# Patient Record
Sex: Female | Born: 1940 | Hispanic: No | Smoking: Never smoker
Health system: Southern US, Community
[De-identification: ages and names within clinical notes are randomized; demographics above are authoritative.]

## PROBLEM LIST (undated history)

## (undated) DIAGNOSIS — H5410 Blindness, one eye, low vision other eye, unspecified eyes: Secondary | ICD-10-CM

## (undated) DIAGNOSIS — J45909 Unspecified asthma, uncomplicated: Secondary | ICD-10-CM

## (undated) DIAGNOSIS — G709 Myoneural disorder, unspecified: Secondary | ICD-10-CM

## (undated) DIAGNOSIS — K219 Gastro-esophageal reflux disease without esophagitis: Secondary | ICD-10-CM

## (undated) DIAGNOSIS — M1990 Unspecified osteoarthritis, unspecified site: Secondary | ICD-10-CM

## (undated) DIAGNOSIS — H5440 Blindness, one eye, unspecified eye: Secondary | ICD-10-CM

## (undated) DIAGNOSIS — M069 Rheumatoid arthritis, unspecified: Secondary | ICD-10-CM

## (undated) DIAGNOSIS — I10 Essential (primary) hypertension: Secondary | ICD-10-CM

## (undated) DIAGNOSIS — H5450 Low vision, one eye, unspecified eye: Secondary | ICD-10-CM

## (undated) DIAGNOSIS — K5900 Constipation, unspecified: Secondary | ICD-10-CM

## (undated) DIAGNOSIS — D649 Anemia, unspecified: Secondary | ICD-10-CM

## (undated) HISTORY — PX: RETINAL DETACHMENT SURGERY: SHX105

## (undated) HISTORY — PX: REFRACTIVE SURGERY: SHX103

## (undated) HISTORY — PX: CATARACT EXTRACTION W/ INTRAOCULAR LENS IMPLANT: SHX1309

## (undated) HISTORY — PX: JOINT REPLACEMENT: SHX530

## (undated) HISTORY — PX: EYE SURGERY: SHX253

---

## 2012-10-16 ENCOUNTER — Ambulatory Visit: Payer: Self-pay | Admitting: Physician Assistant

## 2012-11-29 ENCOUNTER — Ambulatory Visit: Payer: Self-pay | Admitting: Family Medicine

## 2013-01-27 ENCOUNTER — Other Ambulatory Visit: Payer: Self-pay | Admitting: Orthopedic Surgery

## 2013-03-17 ENCOUNTER — Encounter (HOSPITAL_COMMUNITY)
Admission: RE | Admit: 2013-03-17 | Discharge: 2013-03-17 | Disposition: A | Discharge status: Home / Self Care | Payer: Medicaid Other | Source: Ambulatory Visit | Attending: Orthopedic Surgery | Admitting: Orthopedic Surgery

## 2013-03-17 ENCOUNTER — Encounter (HOSPITAL_COMMUNITY): Payer: Self-pay | Admitting: Vascular Surgery

## 2013-03-17 ENCOUNTER — Other Ambulatory Visit (HOSPITAL_COMMUNITY): Payer: Self-pay | Admitting: *Deleted

## 2013-03-17 ENCOUNTER — Encounter (HOSPITAL_COMMUNITY): Payer: Self-pay

## 2013-03-17 DIAGNOSIS — Z01818 Encounter for other preprocedural examination: Secondary | ICD-10-CM | POA: Insufficient documentation

## 2013-03-17 DIAGNOSIS — Z01812 Encounter for preprocedural laboratory examination: Secondary | ICD-10-CM | POA: Insufficient documentation

## 2013-03-17 HISTORY — DX: Constipation, unspecified: K59.00

## 2013-03-17 HISTORY — DX: Blindness, one eye, low vision other eye, unspecified eyes: H54.10

## 2013-03-17 HISTORY — DX: Low vision, one eye, unspecified eye: H54.50

## 2013-03-17 HISTORY — DX: Anemia, unspecified: D64.9

## 2013-03-17 HISTORY — DX: Blindness, one eye, unspecified eye: H54.40

## 2013-03-17 HISTORY — DX: Unspecified asthma, uncomplicated: J45.909

## 2013-03-17 HISTORY — DX: Essential (primary) hypertension: I10

## 2013-03-17 HISTORY — DX: Myoneural disorder, unspecified: G70.9

## 2013-03-17 HISTORY — DX: Unspecified osteoarthritis, unspecified site: M19.90

## 2013-03-17 LAB — BASIC METABOLIC PANEL
CO2: 28 mEq/L (ref 19–32)
Calcium: 9.5 mg/dL (ref 8.4–10.5)
Chloride: 97 mEq/L (ref 96–112)
GFR calc Af Amer: >90 mL/min (ref >90)
Glucose, Bld: 98 mg/dL (ref 70–99)
Potassium: 3.8 mEq/L (ref 3.5–5.1)
Sodium: 135 mEq/L (ref 135–145)

## 2013-03-17 LAB — URINALYSIS, ROUTINE W REFLEX MICROSCOPIC
Glucose, UA: NEGATIVE mg/dL
Leukocytes, UA: NEGATIVE
Protein, ur: NEGATIVE mg/dL
Urobilinogen, UA: 0.2 mg/dL (ref 0.0–1.0)
pH: 7.5 (ref 5.0–8.0)

## 2013-03-17 LAB — CBC
HCT: 34.3 % — ABNORMAL LOW (ref 36.0–46.0)
Hemoglobin: 10.7 g/dL — ABNORMAL LOW (ref 12.0–15.0)
MCH: 25.2 pg — ABNORMAL LOW (ref 26.0–34.0)
MCV: 80.9 fL (ref 78.0–100.0)
RBC: 4.24 MIL/uL (ref 3.87–5.11)

## 2013-03-17 LAB — PROTIME-INR: INR: 1.16 (ref 0.00–1.49)

## 2013-03-17 LAB — SURGICAL PCR SCREEN: Staphylococcus aureus: NEGATIVE

## 2013-03-17 LAB — TYPE AND SCREEN
ABO/RH(D): O POS
Antibody Screen: NEGATIVE

## 2013-03-17 MED ORDER — CHLORHEXIDINE GLUCONATE 4 % EX LIQD: 60.0000 mL | Freq: Once | CUTANEOUS | Status: DC

## 2013-03-17 NOTE — Progress Notes (Signed)
Anesthesia Chart Review:  Patient is a 72 year old female scheduled for right TKA on 03/25/13 by Dr. August Saucer.   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 PCP is Dr. Fleet Contras at [facility] who is aware--and encouraged--patient to undergo TKR (see his 03/05/13 note).  Meds: Bisoprolol, Azopt, Advair, HCTZ, Vesicare, theophylline, tramadol, Travoprost.  EKG from [facility] showed SR, anteroseptal T wave abnormality, consider ischemia.  This was already reviewed by her PCP. There was no recent stress or echo at her PCP office.  There are no previous EKGs in [place] or Epic.  CXR on 03/17/13 showed: 1. No radiographic evidence of acute cardiopulmonary disease.  2. Borderline cardiomegaly.  3. Atherosclerosis of the thoracic aorta.   Preoperative labs noted.  Cr 0.74.  H/H 10.7/34.4.  PLT 435K.  PT/INR WNL.  PTT 42.  T&S done.  UA WNL.  Urine culture pending.  I reviewed above with anesthesiologist Dr. Noreene Larsson.  Since there is possible anterior ischemic changes on EKG, he recommended preoperative cardiology evaluation.  I have notified Kim at Dr. Diamantina [place] office.  Velna Ochs [facility] Short Stay Center/Anesthesiology Phone [phone] 03/18/2013 2:37 PM

## 2013-03-17 NOTE — Pre-Procedure Instructions (Signed)
Brecklynn Porte  03/17/2013   Your procedure is scheduled on:  Tuesday, March 25, 2013 at 7:30 AM.   Report to [facility] Entrance "A" at 5:30 AM.   Call this number if you have problems the morning of surgery: [phone]   Remember:   Do not eat food or drink liquids after midnight Monday, 03/24/13.   Take these medicines the morning of surgery with A SIP OF WATER: Fluticasone-Salmeterol (ADVAIR) traMADol, (ULTRAM) and my use eye drops ,     Do not wear jewelry, make-up or nail polish.  Do not wear lotions, powders, or perfumes. You may wear deodorant.  Do not shave 48 hours prior to surgery.   Do not bring valuables to the hospital.  [facility] is not responsible                  for any belongings or valuables.               Contacts, dentures or bridgework may not be worn into surgery.  Leave suitcase in the car. After surgery it may be brought to your room.  For patients admitted to the hospital, discharge time is determined by your                treatment team.               Special Instructions: Shower using CHG 2 nights before surgery and the night before surgery.  If you shower the day of surgery use CHG.  Use special wash - you have one bottle of CHG for all showers.  You should use approximately 1/3 of the bottle for each shower.   Please read over the following fact sheets that you were given: Pain Booklet, Coughing and Deep Breathing, Blood Transfusion Information, MRSA Information and Surgical Site Infection Prevention

## 2013-03-19 LAB — URINE CULTURE: Culture: NO GROWTH

## 2013-03-25 ENCOUNTER — Encounter (HOSPITAL_COMMUNITY): Admission: RE | Payer: Self-pay | Source: Ambulatory Visit

## 2013-03-25 ENCOUNTER — Inpatient Hospital Stay (HOSPITAL_COMMUNITY): Admission: RE | Admit: 2013-03-25 | Payer: Medicaid Other | Source: Ambulatory Visit | Admitting: Orthopedic Surgery

## 2013-03-25 SURGERY — ARTHROPLASTY, KNEE, TOTAL
Anesthesia: Choice | Site: Knee | Laterality: Right

## 2013-04-01 ENCOUNTER — Other Ambulatory Visit (HOSPITAL_COMMUNITY): Payer: Self-pay | Admitting: Orthopedic Surgery

## 2013-04-09 ENCOUNTER — Encounter (HOSPITAL_COMMUNITY)
Admission: RE | Admit: 2013-04-09 | Discharge: 2013-04-09 | Disposition: A | Discharge status: Home / Self Care | Payer: Medicaid Other | Source: Ambulatory Visit | Attending: Orthopedic Surgery | Admitting: Orthopedic Surgery

## 2013-04-09 ENCOUNTER — Encounter (HOSPITAL_COMMUNITY): Payer: Self-pay

## 2013-04-09 DIAGNOSIS — Z01812 Encounter for preprocedural laboratory examination: Secondary | ICD-10-CM | POA: Insufficient documentation

## 2013-04-09 DIAGNOSIS — Z01818 Encounter for other preprocedural examination: Secondary | ICD-10-CM | POA: Insufficient documentation

## 2013-04-09 HISTORY — DX: Gastro-esophageal reflux disease without esophagitis: K21.9

## 2013-04-09 LAB — CBC
Hemoglobin: 10.2 g/dL — ABNORMAL LOW (ref 12.0–15.0)
MCH: 24.8 pg — ABNORMAL LOW (ref 26.0–34.0)
MCV: 78.1 fL (ref 78.0–100.0)
Platelets: 476 10*3/uL — ABNORMAL HIGH (ref 150–400)
RBC: 4.11 MIL/uL (ref 3.87–5.11)

## 2013-04-09 LAB — BASIC METABOLIC PANEL
CO2: 26 mEq/L (ref 19–32)
Calcium: 9.1 mg/dL (ref 8.4–10.5)
Glucose, Bld: 90 mg/dL (ref 70–99)
Potassium: 3.5 mEq/L (ref 3.5–5.1)
Sodium: 136 mEq/L (ref 135–145)

## 2013-04-09 LAB — TYPE AND SCREEN: ABO/RH(D): O POS

## 2013-04-09 NOTE — Progress Notes (Signed)
Call to [facility], left a voicemail for [employer], to have preop orders signed & released by Dr. August Saucer

## 2013-04-09 NOTE — Pre-Procedure Instructions (Signed)
Leslie Ball  04/09/2013   Your procedure is scheduled on:  04/15/2013  Report to [facility]  2 * 3    MAIN ENTRANCE A       at 09:00 AM.  Call this number if you have problems the morning of surgery: [phone]   Remember:   Do not eat food or drink liquids after midnight. On  MONDAY   Take these medicines the morning of surgery with A SIP OF WATER:  EYE drop, ADVAIR,  PAIN MEDICINE IF NEEDED    Do not wear jewelry, make-up or nail polish.  Do not wear lotions, powders, or perfumes. You may wear deodorant.  Do not shave 48 hours prior to surgery.  Do not bring valuables to the hospital.  [facility] is not responsible                  for any belongings or valuables.               Contacts, dentures or bridgework may not be worn into surgery.  Leave suitcase in the car. After surgery it may be brought to your room.  For patients admitted to the hospital, discharge time is determined by your                treatment team.               Patients discharged the day of surgery will not be allowed to drive  home.  Name and phone number of your driver:  /w family  Special Instructions: Shower using CHG 2 nights before surgery and the night before surgery.  If you shower the day of surgery use CHG.  Use special wash - you have one bottle of CHG for all showers.  You should use approximately 1/3 of the bottle for each shower.   Please read over the following fact sheets that you were given: Pain Booklet, Coughing and Deep Breathing, Blood Transfusion Information, MRSA Information and Surgical Site Infection Prevention

## 2013-04-09 NOTE — Progress Notes (Signed)
Requested cardiac clearance note & testing done to prepare for surgery, from Dr. Jacinto Halim

## 2013-04-10 NOTE — Progress Notes (Signed)
Pt had lexiscan myocardial perfusion scan Nov. 10, 2014 (inside chart) and also EKG.  No echo was done per the office note...(please read conclusions)   DA

## 2013-04-14 MED ORDER — CEFAZOLIN SODIUM-DEXTROSE 2-3 GM-% IV SOLR: 2.0000 g | INTRAVENOUS | Status: DC

## 2013-04-14 MED ORDER — CEFAZOLIN SODIUM-DEXTROSE 2-3 GM-% IV SOLR
2.0000 g | INTRAVENOUS | Status: AC
  Administered 2013-04-15: 2 g via INTRAVENOUS

## 2013-04-14 MED ORDER — CHLORHEXIDINE GLUCONATE 4 % EX LIQD: 60.0000 mL | Freq: Once | CUTANEOUS | Status: DC

## 2013-04-14 NOTE — H&P (Signed)
TOTAL KNEE ADMISSION H&P  Patient is being admitted for right total knee arthroplasty.  Subjective:  Chief Complaint:right knee pain.  HPI: Leslie Ball, 72 y.o. female, has a history of pain and functional disability in the right knee due to arthritis and has failed non-surgical conservative treatments for greater than 12 weeks to includeNSAID's and/or analgesics, corticosteriod injections, viscosupplementation injections, use of assistive devices and activity modification.  Onset of symptoms was gradual, starting >10 years ago with gradually worsening course since that time. The patient noted no past surgery on the right knee(s).  Patient currently rates pain in the right knee(s) at 8 out of 10 with activity. Patient has night pain, worsening of pain with activity and weight bearing, pain that interferes with activities of daily living, pain with passive range of motion, crepitus and joint swelling.  Patient has evidence of subchondral cysts, subchondral sclerosis, periarticular osteophytes, joint subluxation and joint space narrowing by imaging studies. This patient has had significant inhibition of independent living ability due to knee pain. There is no active infection.  There are no active problems to display for this patient.  Past Medical History  Diagnosis Date  . Hypertension   . Asthma   . Constipation     occasionally  . Neuromuscular disorder     right hand has "pins and needles" feeling at times  . Arthritis   . 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 Complication of anesthesia   . PONV (postoperative nausea and vomiting)   . GERD (gastroesophageal reflux disease)     rare use of Pepto bismol  . Anemia     Past Surgical History  Procedure Laterality Date  . Eye surgery      bilateral eye    No prescriptions prior to admission   No Known Allergies  History  Substance Use Topics  .  Smoking status: Never Smoker   . Smokeless tobacco: Never Used  . Alcohol Use: No    No family history on file.   Review of Systems  Constitutional: Negative.   HENT: Negative.   Eyes: Negative.   Respiratory: Negative.   Cardiovascular: Negative.   Gastrointestinal: Negative.   Genitourinary: Negative.   Musculoskeletal: Positive for joint pain. Negative for falls.  Skin: Negative.   Neurological: Negative.   Endo/Heme/Allergies: Negative.   Psychiatric/Behavioral: Negative.     Objective:  Physical Exam  Constitutional: She appears well-developed.  HENT:  Head: Normocephalic.  Eyes: Pupils are equal, round, and reactive to light.  Neck: Normal range of motion.  Cardiovascular: Normal rate.   Respiratory: Effort normal.  GI: Soft.  Neurological: She is alert.  Skin: Skin is warm.  Psychiatric: She has a normal mood and affect.  right knee shows rom 5 - 95 - dp 2/4 - df pf intact - skin and collaterals intact  Vital signs in last 24 hours:    Labs:   Estimated body mass index is 33.65 kg/(m^2) as calculated from the following:   Height as of 03/17/13: 5\' 2"  (1.575 m).   Weight as of 03/17/13: 83.462 kg (184 lb).   Imaging Review Plain radiographs demonstrate severe degenerative joint disease of the bilaterally knee(s). The overall alignment ismild varus. The bone quality appears to be good for age and reported activity level.  Assessment/Plan:  End stage arthritis, right knee   The patient history, physical examination, clinical  judgment of the provider and imaging studies are consistent with end stage degenerative joint disease of the right knee(s) and total knee arthroplasty is deemed medically necessary. The treatment options including medical management, injection therapy arthroscopy and arthroplasty were discussed at length. The risks and benefits of total knee arthroplasty were presented and reviewed. The risks due to aseptic loosening, infection,  stiffness, patella tracking problems, thromboembolic complications and other imponderables were discussed. The patient acknowledged the explanation, agreed to proceed with the plan and consent was signed. Patient is being admitted for inpatient treatment for surgery, pain control, PT, OT, prophylactic antibiotics, VTE prophylaxis, progressive ambulation and ADL's and discharge planning. The patient is planning to be discharged to skilled nursing facility

## 2013-04-15 ENCOUNTER — Encounter (HOSPITAL_COMMUNITY)
Admission: RE | Disposition: A | Discharge status: Home Health | Payer: Self-pay | Source: Ambulatory Visit | Attending: Orthopedic Surgery

## 2013-04-15 ENCOUNTER — Inpatient Hospital Stay (HOSPITAL_COMMUNITY): Payer: Medicaid Other | Admitting: Anesthesiology

## 2013-04-15 ENCOUNTER — Inpatient Hospital Stay (HOSPITAL_COMMUNITY)
Admission: RE | Admit: 2013-04-15 | Discharge: 2013-04-21 | DRG: 470 | Disposition: A | Discharge status: Home Health | Payer: Medicaid Other | Source: Ambulatory Visit | Attending: Orthopedic Surgery | Admitting: Orthopedic Surgery

## 2013-04-15 ENCOUNTER — Encounter (HOSPITAL_COMMUNITY): Payer: Self-pay | Admitting: Anesthesiology

## 2013-04-15 ENCOUNTER — Encounter (HOSPITAL_COMMUNITY): Payer: Medicaid Other | Admitting: Anesthesiology

## 2013-04-15 DIAGNOSIS — Z7901 Long term (current) use of anticoagulants: Secondary | ICD-10-CM

## 2013-04-15 DIAGNOSIS — M1712 Unilateral primary osteoarthritis, left knee: Secondary | ICD-10-CM | POA: Diagnosis present

## 2013-04-15 DIAGNOSIS — K219 Gastro-esophageal reflux disease without esophagitis: Secondary | ICD-10-CM | POA: Diagnosis present

## 2013-04-15 DIAGNOSIS — H5440 Blindness, one eye, unspecified eye: Secondary | ICD-10-CM | POA: Diagnosis present

## 2013-04-15 DIAGNOSIS — M1710 Unilateral primary osteoarthritis, unspecified knee: Principal | ICD-10-CM | POA: Diagnosis present

## 2013-04-15 DIAGNOSIS — Z79899 Other long term (current) drug therapy: Secondary | ICD-10-CM

## 2013-04-15 DIAGNOSIS — J45909 Unspecified asthma, uncomplicated: Secondary | ICD-10-CM | POA: Diagnosis present

## 2013-04-15 DIAGNOSIS — H409 Unspecified glaucoma: Secondary | ICD-10-CM | POA: Diagnosis present

## 2013-04-15 DIAGNOSIS — I10 Essential (primary) hypertension: Secondary | ICD-10-CM | POA: Diagnosis present

## 2013-04-15 DIAGNOSIS — H3320 Serous retinal detachment, unspecified eye: Secondary | ICD-10-CM | POA: Diagnosis present

## 2013-04-15 HISTORY — PX: TOTAL KNEE ARTHROPLASTY: SHX125

## 2013-04-15 LAB — GRAM STAIN

## 2013-04-15 SURGERY — ARTHROPLASTY, KNEE, TOTAL
Anesthesia: Spinal | Site: Knee | Laterality: Right | Wound class: Clean

## 2013-04-15 MED ORDER — METHOCARBAMOL 100 MG/ML IJ SOLN
500.0000 mg | Freq: Four times a day (QID) | INTRAVENOUS | Status: DC | PRN
  Filled 2013-04-15: qty 5

## 2013-04-15 MED ORDER — BUPIVACAINE LIPOSOME 1.3 % IJ SUSP
20.0000 mL | Freq: Once | INTRAMUSCULAR | Status: DC
  Filled 2013-04-15: qty 20

## 2013-04-15 MED ORDER — WARFARIN VIDEO: Freq: Once | Status: DC

## 2013-04-15 MED ORDER — DEXAMETHASONE SODIUM PHOSPHATE 4 MG/ML IJ SOLN
INTRAMUSCULAR | Status: DC | PRN
  Administered 2013-04-15: 8 mg via INTRAVENOUS

## 2013-04-15 MED ORDER — CLONIDINE HCL (ANALGESIA) 100 MCG/ML EP SOLN
EPIDURAL | Status: DC | PRN
  Administered 2013-04-15: 1 mL

## 2013-04-15 MED ORDER — MIDAZOLAM HCL 5 MG/5ML IJ SOLN
INTRAMUSCULAR | Status: DC | PRN
  Administered 2013-04-15: 2 mg via INTRAVENOUS

## 2013-04-15 MED ORDER — DARIFENACIN HYDROBROMIDE ER 7.5 MG PO TB24
7.5000 mg | ORAL_TABLET | Freq: Every day | ORAL | Status: DC
  Administered 2013-04-16 – 2013-04-21 (×6): 7.5 mg via ORAL
  Filled 2013-04-15 (×7): qty 1

## 2013-04-15 MED ORDER — BUPIVACAINE HCL (PF) 0.25 % IJ SOLN
INTRAMUSCULAR | Status: DC | PRN
  Administered 2013-04-15: 20 mL

## 2013-04-15 MED ORDER — OXYCODONE HCL 5 MG PO TABS
5.0000 mg | ORAL_TABLET | ORAL | Status: DC | PRN
  Administered 2013-04-15: 5 mg via ORAL
  Administered 2013-04-16: 10 mg via ORAL
  Administered 2013-04-16: 5 mg via ORAL
  Administered 2013-04-16 (×2): 10 mg via ORAL
  Administered 2013-04-16: 5 mg via ORAL
  Administered 2013-04-17 – 2013-04-21 (×15): 10 mg via ORAL
  Filled 2013-04-15 (×8): qty 2
  Filled 2013-04-15: qty 1
  Filled 2013-04-15 (×5): qty 2
  Filled 2013-04-15: qty 1
  Filled 2013-04-15 (×2): qty 2
  Filled 2013-04-15: qty 1
  Filled 2013-04-15 (×3): qty 2

## 2013-04-15 MED ORDER — ONDANSETRON HCL 4 MG/2ML IJ SOLN: 4.0000 mg | Freq: Four times a day (QID) | INTRAMUSCULAR | Status: DC | PRN

## 2013-04-15 MED ORDER — OXYCODONE HCL 5 MG/5ML PO SOLN: 5.0000 mg | Freq: Once | ORAL | Status: DC | PRN

## 2013-04-15 MED ORDER — HYDROCHLOROTHIAZIDE 12.5 MG PO CAPS
12.5000 mg | ORAL_CAPSULE | Freq: Every day | ORAL | Status: DC
  Administered 2013-04-16 – 2013-04-21 (×6): 12.5 mg via ORAL
  Filled 2013-04-15 (×7): qty 1

## 2013-04-15 MED ORDER — THEOPHYLLINE ER 300 MG PO TB12
150.0000 mg | ORAL_TABLET | Freq: Every day | ORAL | Status: DC
  Administered 2013-04-15 – 2013-04-20 (×6): 150 mg via ORAL
  Filled 2013-04-15 (×8): qty 1

## 2013-04-15 MED ORDER — HYDROMORPHONE HCL PF 1 MG/ML IJ SOLN
0.2500 mg | INTRAMUSCULAR | Status: DC | PRN
  Administered 2013-04-15: 0.5 mg via INTRAVENOUS

## 2013-04-15 MED ORDER — LACTATED RINGERS IV SOLN
INTRAVENOUS | Status: DC
  Administered 2013-04-15: 10:00:00 via INTRAVENOUS

## 2013-04-15 MED ORDER — PROPOFOL 10 MG/ML IV BOLUS
INTRAVENOUS | Status: DC | PRN
  Administered 2013-04-15 (×2): 20 mg via INTRAVENOUS

## 2013-04-15 MED ORDER — FENTANYL CITRATE 0.05 MG/ML IJ SOLN
INTRAMUSCULAR | Status: DC | PRN
  Administered 2013-04-15 (×2): 50 ug via INTRAVENOUS
  Administered 2013-04-15: 75 ug via INTRAVENOUS
  Administered 2013-04-15: 50 ug via INTRAVENOUS
  Administered 2013-04-15: 25 ug via INTRATHECAL

## 2013-04-15 MED ORDER — LATANOPROST 0.005 % OP SOLN
1.0000 [drp] | Freq: Every day | OPHTHALMIC | Status: DC
  Administered 2013-04-18 – 2013-04-20 (×3): 1 [drp] via OPHTHALMIC
  Filled 2013-04-15: qty 2.5

## 2013-04-15 MED ORDER — HYDROMORPHONE HCL PF 1 MG/ML IJ SOLN
INTRAMUSCULAR | Status: DC | PRN
  Administered 2013-04-15 (×2): 0.5 mg via INTRAVENOUS

## 2013-04-15 MED ORDER — HYDROMORPHONE HCL PF 1 MG/ML IJ SOLN
INTRAMUSCULAR | Status: AC
  Filled 2013-04-15: qty 1

## 2013-04-15 MED ORDER — LACTATED RINGERS IV SOLN
INTRAVENOUS | Status: DC | PRN
  Administered 2013-04-15 (×3): via INTRAVENOUS

## 2013-04-15 MED ORDER — ACETAMINOPHEN 325 MG PO TABS: 650.0000 mg | ORAL_TABLET | Freq: Four times a day (QID) | ORAL | Status: DC | PRN

## 2013-04-15 MED ORDER — BISOPROLOL FUMARATE 10 MG PO TABS
10.0000 mg | ORAL_TABLET | Freq: Every day | ORAL | Status: DC
  Administered 2013-04-15 – 2013-04-20 (×6): 10 mg via ORAL
  Filled 2013-04-15 (×8): qty 1

## 2013-04-15 MED ORDER — SODIUM CHLORIDE 0.9 % IJ SOLN
INTRAMUSCULAR | Status: DC | PRN
  Administered 2013-04-15: 13:00:00

## 2013-04-15 MED ORDER — ACETAMINOPHEN 650 MG RE SUPP: 650.0000 mg | Freq: Four times a day (QID) | RECTAL | Status: DC | PRN

## 2013-04-15 MED ORDER — PROPOFOL INFUSION 10 MG/ML OPTIME
INTRAVENOUS | Status: DC | PRN
  Administered 2013-04-15: 75 ug/kg/min via INTRAVENOUS

## 2013-04-15 MED ORDER — CLONIDINE HCL (ANALGESIA) 100 MCG/ML EP SOLN
150.0000 ug | Freq: Once | EPIDURAL | Status: DC
  Filled 2013-04-15: qty 1.5

## 2013-04-15 MED ORDER — PROMETHAZINE HCL 25 MG/ML IJ SOLN: 6.2500 mg | INTRAMUSCULAR | Status: DC | PRN

## 2013-04-15 MED ORDER — CEFAZOLIN SODIUM-DEXTROSE 2-3 GM-% IV SOLR
INTRAVENOUS | Status: AC
  Filled 2013-04-15: qty 50

## 2013-04-15 MED ORDER — MORPHINE SULFATE 4 MG/ML IJ SOLN
INTRAMUSCULAR | Status: DC | PRN
  Administered 2013-04-15: 8 mg

## 2013-04-15 MED ORDER — CEFAZOLIN SODIUM-DEXTROSE 2-3 GM-% IV SOLR
2.0000 g | Freq: Three times a day (TID) | INTRAVENOUS | Status: AC
  Administered 2013-04-15 – 2013-04-16 (×2): 2 g via INTRAVENOUS
  Filled 2013-04-15 (×3): qty 50

## 2013-04-15 MED ORDER — PHENYLEPHRINE HCL 10 MG/ML IJ SOLN
10.0000 mg | INTRAVENOUS | Status: DC | PRN
  Administered 2013-04-15: 25 ug/min via INTRAVENOUS

## 2013-04-15 MED ORDER — METHOCARBAMOL 500 MG PO TABS
500.0000 mg | ORAL_TABLET | Freq: Four times a day (QID) | ORAL | Status: DC | PRN
  Administered 2013-04-17 – 2013-04-19 (×3): 500 mg via ORAL
  Filled 2013-04-15 (×3): qty 1

## 2013-04-15 MED ORDER — BRINZOLAMIDE 1 % OP SUSP
1.0000 [drp] | Freq: Three times a day (TID) | OPHTHALMIC | Status: DC
  Administered 2013-04-15 – 2013-04-21 (×18): 1 [drp] via OPHTHALMIC
  Filled 2013-04-15: qty 10

## 2013-04-15 MED ORDER — ONDANSETRON HCL 4 MG PO TABS: 4.0000 mg | ORAL_TABLET | Freq: Four times a day (QID) | ORAL | Status: DC | PRN

## 2013-04-15 MED ORDER — HYDROMORPHONE HCL PF 1 MG/ML IJ SOLN
1.0000 mg | INTRAMUSCULAR | Status: DC | PRN
  Administered 2013-04-16: 1 mg via INTRAVENOUS
  Filled 2013-04-15: qty 1

## 2013-04-15 MED ORDER — BUPIVACAINE IN DEXTROSE 0.75-8.25 % IT SOLN
INTRATHECAL | Status: DC | PRN
  Administered 2013-04-15: 15 mg via INTRATHECAL

## 2013-04-15 MED ORDER — ONDANSETRON HCL 4 MG/2ML IJ SOLN
INTRAMUSCULAR | Status: DC | PRN
  Administered 2013-04-15: 4 mg via INTRAVENOUS

## 2013-04-15 MED ORDER — METOCLOPRAMIDE HCL 10 MG PO TABS: 5.0000 mg | ORAL_TABLET | Freq: Three times a day (TID) | ORAL | Status: DC | PRN

## 2013-04-15 MED ORDER — WARFARIN - PHARMACIST DOSING INPATIENT
Freq: Every day | Status: DC
  Administered 2013-04-17: 18:00:00

## 2013-04-15 MED ORDER — OXYCODONE HCL 5 MG PO TABS: 5.0000 mg | ORAL_TABLET | Freq: Once | ORAL | Status: DC | PRN

## 2013-04-15 MED ORDER — WARFARIN SODIUM 5 MG PO TABS
5.0000 mg | ORAL_TABLET | Freq: Every day | ORAL | Status: DC
  Administered 2013-04-15 – 2013-04-17 (×3): 5 mg via ORAL
  Filled 2013-04-15 (×5): qty 1

## 2013-04-15 MED ORDER — METOCLOPRAMIDE HCL 5 MG/ML IJ SOLN: 5.0000 mg | Freq: Three times a day (TID) | INTRAMUSCULAR | Status: DC | PRN

## 2013-04-15 MED ORDER — PATIENT'S GUIDE TO USING COUMADIN BOOK
Freq: Once | Status: AC
  Administered 2013-04-17: 18:00:00
  Filled 2013-04-15 (×2): qty 1

## 2013-04-15 MED ORDER — BUPIVACAINE HCL (PF) 0.25 % IJ SOLN
30.0000 mL | Freq: Once | INTRAMUSCULAR | Status: DC
  Filled 2013-04-15: qty 30

## 2013-04-15 MED ORDER — CHLORHEXIDINE GLUCONATE 4 % EX LIQD: 60.0000 mL | Freq: Once | CUTANEOUS | Status: DC

## 2013-04-15 MED ORDER — 0.9 % SODIUM CHLORIDE (POUR BTL) OPTIME
TOPICAL | Status: DC | PRN
  Administered 2013-04-15: 1000 mL

## 2013-04-15 MED ORDER — PHENOL 1.4 % MT LIQD: 1.0000 | OROMUCOSAL | Status: DC | PRN

## 2013-04-15 MED ORDER — HYDROMORPHONE HCL PF 1 MG/ML IJ SOLN
0.2500 mg | INTRAMUSCULAR | Status: DC | PRN
  Administered 2013-04-15 (×4): 0.5 mg via INTRAVENOUS

## 2013-04-15 MED ORDER — MOMETASONE FURO-FORMOTEROL FUM 100-5 MCG/ACT IN AERO
2.0000 | INHALATION_SPRAY | Freq: Two times a day (BID) | RESPIRATORY_TRACT | Status: DC
  Administered 2013-04-15 – 2013-04-21 (×10): 2 via RESPIRATORY_TRACT
  Filled 2013-04-15 (×2): qty 8.8

## 2013-04-15 MED ORDER — POTASSIUM CHLORIDE IN NACL 20-0.9 MEQ/L-% IV SOLN
INTRAVENOUS | Status: AC
  Administered 2013-04-16: 02:00:00 via INTRAVENOUS
  Filled 2013-04-15 (×2): qty 1000

## 2013-04-15 MED ORDER — CELECOXIB 200 MG PO CAPS
200.0000 mg | ORAL_CAPSULE | Freq: Two times a day (BID) | ORAL | Status: DC
  Administered 2013-04-15 – 2013-04-21 (×12): 200 mg via ORAL
  Filled 2013-04-15 (×14): qty 1

## 2013-04-15 MED ORDER — MENTHOL 3 MG MT LOZG: 1.0000 | LOZENGE | OROMUCOSAL | Status: DC | PRN

## 2013-04-15 MED ORDER — SODIUM CHLORIDE 0.9 % IJ SOLN
INTRAMUSCULAR | Status: AC
  Filled 2013-04-15: qty 40

## 2013-04-15 SURGICAL SUPPLY — 79 items
BANDAGE ELASTIC 4 VELCRO ST LF (GAUZE/BANDAGES/DRESSINGS) ×2 IMPLANT
BANDAGE ELASTIC 6 VELCRO ST LF (GAUZE/BANDAGES/DRESSINGS) ×2 IMPLANT
BANDAGE ESMARK 6X9 LF (GAUZE/BANDAGES/DRESSINGS) ×1 IMPLANT
BASEPLATE TIBIAL UNV SZ3 (Orthopedic Implant) ×2 IMPLANT
BEARIN INSERT TIBIAL 3X13 (Insert) ×2 IMPLANT
BEARING INSERT TIBIAL 3X13 (Insert) ×1 IMPLANT
BLADE SAG 18X100X1.27 (BLADE) ×2 IMPLANT
BLADE SAW SGTL 13.0X1.19X90.0M (BLADE) ×2 IMPLANT
BNDG COHESIVE 6X5 TAN STRL LF (GAUZE/BANDAGES/DRESSINGS) ×2 IMPLANT
BNDG ELASTIC 6X10 VLCR STRL LF (GAUZE/BANDAGES/DRESSINGS) ×6 IMPLANT
BNDG ESMARK 6X9 LF (GAUZE/BANDAGES/DRESSINGS) ×2
BOWL SMART MIX CTS (DISPOSABLE) ×2 IMPLANT
CEMENT BONE SIMPLEX SPEEDSET (Cement) ×4 IMPLANT
CLOTH BEACON ORANGE TIMEOUT ST (SAFETY) ×2 IMPLANT
COVER SURGICAL LIGHT HANDLE (MISCELLANEOUS) ×2 IMPLANT
CUFF TOURNIQUET SINGLE 34IN LL (TOURNIQUET CUFF) ×2 IMPLANT
CUFF TOURNIQUET SINGLE 44IN (TOURNIQUET CUFF) IMPLANT
DRAPE INCISE IOBAN 66X45 STRL (DRAPES) ×2 IMPLANT
DRAPE ORTHO SPLIT 77X108 STRL (DRAPES) ×3
DRAPE SURG ORHT 6 SPLT 77X108 (DRAPES) ×3 IMPLANT
DRAPE U-SHAPE 47X51 STRL (DRAPES) ×2 IMPLANT
DRSG PAD ABDOMINAL 8X10 ST (GAUZE/BANDAGES/DRESSINGS) ×2 IMPLANT
DURAPREP 26ML APPLICATOR (WOUND CARE) ×2 IMPLANT
ELECT REM PT RETURN 9FT ADLT (ELECTROSURGICAL) ×2
ELECTRODE REM PT RTRN 9FT ADLT (ELECTROSURGICAL) ×1 IMPLANT
FACESHIELD LNG OPTICON STERILE (SAFETY) ×2 IMPLANT
FEMORAL PEG DISTAL FIXATION (Orthopedic Implant) ×2 IMPLANT
FEMORAL POST STABILIZED NO 3 (Orthopedic Implant) ×2 IMPLANT
GAUZE XEROFORM 5X9 LF (GAUZE/BANDAGES/DRESSINGS) ×2 IMPLANT
GLOVE BIOGEL PI IND STRL 7.5 (GLOVE) ×1 IMPLANT
GLOVE BIOGEL PI IND STRL 8 (GLOVE) ×1 IMPLANT
GLOVE BIOGEL PI INDICATOR 7.5 (GLOVE) ×1
GLOVE BIOGEL PI INDICATOR 8 (GLOVE) ×1
GLOVE ECLIPSE 7.0 STRL STRAW (GLOVE) ×4 IMPLANT
GLOVE SURG ORTHO 8.0 STRL STRW (GLOVE) ×2 IMPLANT
GOWN PREVENTION PLUS LG XLONG (DISPOSABLE) ×4 IMPLANT
GOWN PREVENTION PLUS XLARGE (GOWN DISPOSABLE) ×2 IMPLANT
GOWN STRL NON-REIN LRG LVL3 (GOWN DISPOSABLE) ×6 IMPLANT
HANDPIECE INTERPULSE COAX TIP (DISPOSABLE) ×1
HOOD PEEL AWAY FACE SHEILD DIS (HOOD) ×6 IMPLANT
IMMOBILIZER KNEE 20 (SOFTGOODS)
IMMOBILIZER KNEE 20 THIGH 36 (SOFTGOODS) IMPLANT
IMMOBILIZER KNEE 22 UNIV (SOFTGOODS) ×2 IMPLANT
IMMOBILIZER KNEE 24 THIGH 36 (MISCELLANEOUS) IMPLANT
IMMOBILIZER KNEE 24 UNIV (MISCELLANEOUS)
KIT BASIN OR (CUSTOM PROCEDURE TRAY) ×2 IMPLANT
KIT ROOM TURNOVER OR (KITS) ×2 IMPLANT
MANIFOLD NEPTUNE II (INSTRUMENTS) ×2 IMPLANT
NEEDLE 18GX1X1/2 (RX/OR ONLY) (NEEDLE) ×2 IMPLANT
NEEDLE 21X1 OR PACK (NEEDLE) ×2 IMPLANT
NEEDLE SPNL 18GX3.5 QUINCKE PK (NEEDLE) ×2 IMPLANT
NS IRRIG 1000ML POUR BTL (IV SOLUTION) ×4 IMPLANT
PACK TOTAL JOINT (CUSTOM PROCEDURE TRAY) ×2 IMPLANT
PAD ARMBOARD 7.5X6 YLW CONV (MISCELLANEOUS) ×4 IMPLANT
PAD CAST 4YDX4 CTTN HI CHSV (CAST SUPPLIES) ×1 IMPLANT
PADDING CAST COTTON 4X4 STRL (CAST SUPPLIES) ×1
PADDING CAST COTTON 6X4 STRL (CAST SUPPLIES) ×2 IMPLANT
PATELLA ASSYMETRIC 9MM NO 3 (Orthopedic Implant) ×2 IMPLANT
RUBBERBAND STERILE (MISCELLANEOUS) ×2 IMPLANT
SET HNDPC FAN SPRY TIP SCT (DISPOSABLE) ×1 IMPLANT
SPONGE GAUZE 4X4 12PLY (GAUZE/BANDAGES/DRESSINGS) ×2 IMPLANT
SPONGE LAP 18X18 X RAY DECT (DISPOSABLE) ×2 IMPLANT
STAPLER VISISTAT 35W (STAPLE) ×2 IMPLANT
STEM CEMENTED TRIATHLON (Stem) ×2 IMPLANT
SUCTION FRAZIER TIP 10 FR DISP (SUCTIONS) ×2 IMPLANT
SUT ETHILON 3 0 PS 1 (SUTURE) ×4 IMPLANT
SUT VIC AB 0 CTB1 27 (SUTURE) ×6 IMPLANT
SUT VIC AB 1 CT1 27 (SUTURE) ×5
SUT VIC AB 1 CT1 27XBRD ANBCTR (SUTURE) ×5 IMPLANT
SUT VIC AB 2-0 CT1 27 (SUTURE) ×2
SUT VIC AB 2-0 CT1 TAPERPNT 27 (SUTURE) ×2 IMPLANT
SYR 30ML LL (SYRINGE) ×2 IMPLANT
SYR 30ML SLIP (SYRINGE) ×2 IMPLANT
SYR 50ML LL SCALE MARK (SYRINGE) ×2 IMPLANT
SYR TB 1ML LUER SLIP (SYRINGE) ×2 IMPLANT
TOWEL OR 17X24 6PK STRL BLUE (TOWEL DISPOSABLE) ×2 IMPLANT
TOWEL OR 17X26 10 PK STRL BLUE (TOWEL DISPOSABLE) ×4 IMPLANT
TRAY FOLEY CATH 16FRSI W/METER (SET/KITS/TRAYS/PACK) ×2 IMPLANT
WATER STERILE IRR 1000ML POUR (IV SOLUTION) ×4 IMPLANT

## 2013-04-15 NOTE — Progress Notes (Signed)
Orthopedic Tech Progress Note Patient Details:  Leslie Ball 24-Oct-1940 [id number]  CPM Right Knee CPM Right Knee: On Right Knee Flexion (Degrees): 45 Right Knee Extension (Degrees): 0 Additional Comments: Trapeze bar   Cammer, Mickie Bail 04/15/2013, 3:14 PM

## 2013-04-15 NOTE — Progress Notes (Signed)
ANTICOAGULATION CONSULT NOTE - Initial Consult  Pharmacy Consult for Coumadin Indication: VTE prophylaxis  No Known Allergies  Patient Measurements: 83 kg  Medical History: Past Medical History  Diagnosis Date  . Hypertension   . Asthma   . Constipation     occasionally  . Neuromuscular disorder     right hand has "pins and needles" feeling at times  . Arthritis   . 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 Complication of anesthesia   . PONV (postoperative nausea and vomiting)   . GERD (gastroesophageal reflux disease)     rare use of Pepto bismol  . Anemia     Assessment: 72 year old female s/p TKA beginning Coumadin for VTE prophylaxis  Goal of Therapy:  INR 2-3 Monitor platelets by anticoagulation protocol: Yes   Plan:  1) Coumadin 5 mg po daily at 1800 pm 2) Daily INR  Thank you. Okey Regal, PharmD [phone]  Elwin Sleight 04/15/2013,6:15 PM

## 2013-04-15 NOTE — Anesthesia Preprocedure Evaluation (Addendum)
Anesthesia Evaluation  Patient identified by MRN, date of birth, ID band Patient awake    Reviewed: Allergy & Precautions, H&P , NPO status   History of Anesthesia Complications (+) PONV and history of anesthetic complications  Airway Mallampati: II TM Distance: >3 FB Neck ROM: Full    Dental  (+) Teeth Intact and Dental Advisory Given   Pulmonary asthma ,    Pulmonary exam normal       Cardiovascular Exercise Tolerance: Good hypertension, Pt. on medications Rhythm:Regular Rate:Normal     Neuro/Psych  Neuromuscular disease negative neurological ROS  negative psych ROS   GI/Hepatic Neg liver ROS, GERD-  Controlled,  Endo/Other  negative endocrine ROS  Renal/GU negative Renal ROS     Musculoskeletal  (+) Arthritis -, Osteoarthritis,    Abdominal Normal abdominal exam  (+)   Peds  Hematology  (+) anemia ,   Anesthesia Other Findings Language barrier.  Son interpreting @ bedside all anesthetic ?'s answered  Reproductive/Obstetrics negative OB ROS                       Anesthesia Physical Anesthesia Plan  ASA: III  Anesthesia Plan: Spinal   Post-op Pain Management:    Induction: Intravenous  Airway Management Planned: Nasal Cannula  Additional Equipment:   Intra-op Plan:   Post-operative Plan: Extubation in OR  Informed Consent: I have reviewed the patients History and Physical, chart, labs and discussed the procedure including the risks, benefits and alternatives for the proposed anesthesia with the patient or authorized representative who has indicated his/her understanding and acceptance.   Dental advisory given  Plan Discussed with: CRNA, Anesthesiologist and Surgeon  Anesthesia Plan Comments:        Anesthesia Quick Evaluation

## 2013-04-15 NOTE — Transfer of Care (Signed)
Immediate Anesthesia Transfer of Care Note  Patient: Leslie Ball  Procedure(s) Performed: Procedure(s): TOTAL KNEE ARTHROPLASTY (Right)  Patient Location: PACU  Anesthesia Type:General  Level of Consciousness: awake, alert , oriented and patient cooperative  Airway & Oxygen Therapy: Patient Spontanous Breathing and Patient connected to nasal cannula oxygen  Post-op Assessment: Report given to PACU RN and Post -op Vital signs reviewed and stable  Post vital signs: Reviewed and stable  Complications: No apparent anesthesia complications

## 2013-04-15 NOTE — Progress Notes (Signed)
Patient has been asleep.  Medications refused by son.

## 2013-04-15 NOTE — Brief Op Note (Signed)
04/15/2013  1:45 PM  PATIENT:  Lysle Dingwall Kierstead  72 y.o. female  PRE-OPERATIVE DIAGNOSIS:  RIGHT KNEE OSTEOARTHRITIS  POST-OPERATIVE DIAGNOSIS:  RIGHT KNEE OSTEOARTHRITIS  PROCEDURE:  Procedure(s): TOTAL KNEE ARTHROPLASTY  SURGEON:  Surgeon(s): Cammy Copa, MD  ASSISTANT: S vernon pa  ANESTHESIA:   spinal  EBL: 200 ml    Total I/O In: 2000 [I.V.:2000] Out: 375 [Urine:375]  BLOOD ADMINISTERED: none  DRAINS: none   LOCAL MEDICATIONS USED:  exparel - marcaine/morphine/clonidine  SPECIMEN:  No Specimen  COUNTS:  YES  TOURNIQUET:   Total Tourniquet Time Documented: Thigh (Right) - 96 minutes Total: Thigh (Right) - 96 minutes   DICTATION: .Other Dictation: Dictation Number [id number]  PLAN OF CARE: Admit to inpatient   PATIENT DISPOSITION:  PACU - hemodynamically stable

## 2013-04-15 NOTE — Preoperative (Signed)
Beta Blockers   Reason not to administer Beta Blockers:Not Applicable 

## 2013-04-15 NOTE — Anesthesia Procedure Notes (Signed)
Spinal  Patient location during procedure: OR Start time: 04/15/2013 10:47 AM End time: 04/15/2013 10:57 AM Staffing Performed by: anesthesiologist  Preanesthetic Checklist Completed: patient identified, site marked, surgical consent, pre-op evaluation, timeout performed, IV checked, risks and benefits discussed and monitors and equipment checked Spinal Block Patient position: sitting Prep: Betadine Patient monitoring: heart rate, cardiac monitor, continuous pulse ox and blood pressure Approach: right paramedian Location: L2-3 Injection technique: single-shot Needle Needle type: Quincke  Needle gauge: 25 G Needle length: 9 cm Needle insertion depth: 5 cm

## 2013-04-15 NOTE — Anesthesia Postprocedure Evaluation (Signed)
Anesthesia Post Note  Patient: Leslie Ball  Procedure(s) Performed: Procedure(s) (LRB): TOTAL KNEE ARTHROPLASTY (Right)  Anesthesia type: SAB  Patient location: PACU  Post pain: Pain level controlled  Post assessment: Patient's Cardiovascular Status Stable  Post vital signs: Reviewed and stable  Level of consciousness: sedated  Complications: No apparent anesthesia complications

## 2013-04-15 NOTE — Interval H&P Note (Signed)
History and Physical Interval Note:  04/15/2013 7:26 AM  Leslie Ball  has presented today for surgery, with the diagnosis of RIGHT KNEE OSTEOARTHRITIS  The various methods of treatment have been discussed with the patient and family. After consideration of risks, benefits and other options for treatment, the patient has consented to  Procedure(s): TOTAL KNEE ARTHROPLASTY (Right) as a surgical intervention .  The patient's history has been reviewed, patient examined, no change in status, stable for surgery.  I have reviewed the patient's chart and labs.  Questions were answered to the patient's satisfaction.     Rodolfo Gaster SCOTT

## 2013-04-16 ENCOUNTER — Encounter (HOSPITAL_COMMUNITY): Payer: Self-pay | Admitting: Orthopedic Surgery

## 2013-04-16 LAB — BASIC METABOLIC PANEL
BUN: 16 mg/dL (ref 6–23)
CO2: 25 mEq/L (ref 19–32)
Calcium: 8.9 mg/dL (ref 8.4–10.5)
Creatinine, Ser: 0.86 mg/dL (ref 0.50–1.10)
GFR calc non Af Amer: 66 mL/min — ABNORMAL LOW (ref >90)
Glucose, Bld: 103 mg/dL — ABNORMAL HIGH (ref 70–99)
Potassium: 4.1 mEq/L (ref 3.5–5.1)
Sodium: 139 mEq/L (ref 135–145)

## 2013-04-16 LAB — CBC
HCT: 28.8 % — ABNORMAL LOW (ref 36.0–46.0)
Hemoglobin: 8.8 g/dL — ABNORMAL LOW (ref 12.0–15.0)
MCH: 24.5 pg — ABNORMAL LOW (ref 26.0–34.0)
MCV: 80.2 fL (ref 78.0–100.0)
RBC: 3.59 MIL/uL — ABNORMAL LOW (ref 3.87–5.11)

## 2013-04-16 NOTE — Progress Notes (Signed)
Occupational Therapy Evaluation Patient Details Name: Leslie Ball MRN: [id number] DOB: 09/20/1940 Today's Date: 04/16/2013 Time: 4098-1191 OT Time Calculation (min): 29 min  OT Assessment / Plan / Recommendation History of present illness R TKA   Clinical Impression   Pt presents with below problem list. Pt independent with ADLs, PTA (although told me it was difficult). Pt will benefit from acute OT to increase independence prior to d/c.     OT Assessment  Patient needs continued OT Services    Follow Up Recommendations  Home health OT;Supervision/Assistance - 24 hour    Barriers to Discharge      Equipment Recommendations  3 in 1 bedside comode;Tub/shower bench    Recommendations for Other Services    Frequency  Min 2X/week    Precautions / Restrictions Precautions Precautions: Knee;Fall Required Braces or Orthoses: Knee Immobilizer - Right Knee Immobilizer - Right: On when out of bed or walking Restrictions Weight Bearing Restrictions: Yes RLE Weight Bearing: Weight bearing as tolerated   Pertinent Vitals/Pain Pain 9/10 in knee at end of session. Repositioned.     ADL  Grooming: Set up Where Assessed - Grooming: Supported sitting Lower Body Bathing: Maximal assistance Where Assessed - Lower Body Bathing: Supported sit to stand Upper Body Dressing: Set up Where Assessed - Upper Body Dressing: Supported sitting Lower Body Dressing: Maximal assistance Where Assessed - Lower Body Dressing: Supported sit to Pharmacist, hospital: Moderate assistance Toilet Transfer Method: Sit to stand Toilet Transfer Equipment: Other (comment) (from recliner chair) Tub/Shower Transfer Method: Not assessed Equipment Used: Knee Immobilizer;Gait belt;Rolling walker;Long-handled shoe horn;Long-handled sponge;Reacher;Sock aid Transfers/Ambulation Related to ADLs: Assisted with walker and cues for sequencing with ambulation. Mod A for sit to stand and Min A for stand to sit  transfer. ADL Comments: Educated and had pt practice with reacher and sockaid. Pt in a lot of pain. Explained it is beneficial to bend to right sock as it increases ROM in knee. Spoke with daughter in law about tub transfer techniques and DME.    OT Diagnosis: Acute pain  OT Problem List: Decreased strength;Decreased range of motion;Decreased activity tolerance;Impaired balance (sitting and/or standing);Decreased knowledge of use of DME or AE;Decreased knowledge of precautions;Pain OT Treatment Interventions: Self-care/ADL training;DME and/or AE instruction;Therapeutic activities;Patient/family education;Balance training   OT Goals(Current goals can be found in the care plan section) Acute Rehab OT Goals Patient Stated Goal: not stated OT Goal Formulation: With patient/family Time For Goal Achievement: 04/23/13 Potential to Achieve Goals: Good ADL Goals Pt Will Perform Grooming: with set-up;with supervision;standing Pt Will Perform Lower Body Bathing: with set-up;with supervision;with adaptive equipment;sit to/from stand Pt Will Perform Lower Body Dressing: with adaptive equipment;sit to/from stand;with supervision;with set-up Pt Will Transfer to Toilet: with supervision;ambulating (3 in 1 over commode) Pt Will Perform Toileting - Clothing Manipulation and hygiene: with supervision;sit to/from stand Pt Will Perform Tub/Shower Transfer: Tub transfer;with supervision;ambulating;rolling walker (tub equipment tbd)  Visit Information  Last OT Received On: 04/16/13 Assistance Needed: +1 History of Present Illness: R TKA       Prior Functioning     Home Living Family/patient expects to be discharged to:: Private residence Living Arrangements: Alone (family will stay with pt first few days postop) Available Help at Discharge: Family;Available 24 hours/day (first few days) Type of Home: House Home Access: Level entry Home Layout: One level Home Equipment: None Prior Function Level of  Independence: Independent Communication Communication: Prefers language other than English;Other (comment) (son able to translate on phone)  Vision/Perception     Cognition  Cognition Arousal/Alertness: Awake/alert Behavior During Therapy: WFL for tasks assessed/performed Overall Cognitive Status: Within Functional Limits for tasks assessed    Extremity/Trunk Assessment Upper Extremity Assessment Upper Extremity Assessment: Overall WFL for tasks assessed Lower Extremity Assessment Lower Extremity Assessment: Defer to PT evaluation RLE Deficits / Details: decr AROM and strength, limited by pain postop RLE: Unable to fully assess due to pain     Mobility Bed Mobility Bed Mobility: Sit to Supine;Scooting to HOB Sit to Supine: 3: Mod assist Scooting to HOB: 4: Min guard Details for Bed Mobility Assistance: Assisted with LE's. Cues to use rails to scoot HOB.  Transfers Transfers: Sit to Stand;Stand to Sit Sit to Stand: 3: Mod assist;With upper extremity assist;From chair/3-in-1 Stand to Sit: 4: Min assist;To bed Details for Transfer Assistance: Cues for hand placement.         Balance     End of Session OT - End of Session Equipment Utilized During Treatment: Gait belt;Rolling walker;Right knee immobilizer Activity Tolerance: Patient limited by pain Patient left: in bed;with call bell/phone within reach;with family/visitor present CPM Right Knee CPM Right Knee: Off  GO      Earlie Raveling OTR/L [phone] 04/16/2013, 1:25 PM

## 2013-04-16 NOTE — Op Note (Signed)
NAMEMarland Kitchen  [name], [name] NO.:  [id number]  MEDICAL RECORD NO.:  [id number]  LOCATION:  5N29C                        FACILITY:  [facility]  PHYSICIAN:  Burnard Bunting, M.D.    DATE OF BIRTH:  08-05-1940  DATE OF PROCEDURE:  04/15/2013 DATE OF DISCHARGE:                              OPERATIVE REPORT   PREOPERATIVE DIAGNOSIS:  Right knee arthritis.  POSTOPERATIVE DIAGNOSIS:  Right knee arthritis.  PROCEDURE:  Right total knee replacement using stryker components, 3 femur, 3 tibia, 13 poly spacer, 29 3-peg offset patella, posterior cruciate sacrificing.  SURGEON:  Burnard Bunting, M.D.  ASSISTANT:  Leslie Ball, P.A.  ANESTHESIA:  Spinal.  BLOOD LOSS:  200.  DRAINS:  None.  TOURNIQUET TIME:  1 hour 36 minutes at 300 mmHg.  INDICATIONS:  Leslie Ball is a 72 year old patient with end-stage right knee arthritis presents for operative management after explanation of risks and benefits.  PROCEDURE IN DETAIL:  The patient was brought to operating room where general endotracheal anesthesia was induced.  Preoperative antibiotics administered.  Time-out was called.  Right leg was prescrubbed with alcohol and Betadine, which was allowed to air dry, prepped with DuraPrep solution, and draped in sterile manner.  Leslie Ball was used to cover the operative field.  Leg was elevated and exsanguinated with Esmarch wrap.  Tourniquet was then inflated.  Median parapatellar approach was made to the knee.  The patient had fairly significant erosive changes in the bone.  Fluid was sent for Gram stain, which was negative.  Erosive changes and synovitis were present.  Synovectomy was performed.  Osteophytes were removed from the femur and tibia.  Lateral patellofemoral ligament was released.  Fat pad partially excised.  Soft tissue on top of the periosteum removed from the anterior distal femur. The patient had generally symmetric where between the medial and lateral tibial plateaus.  A 2 mm  cut was made off the most affected medial side. An 8 mm cut was made off the distal femur, a 5 degrees of valgus. Spacer block was placed and this was found to be adequate resection. Chamfer and box cuts were made.  Tibia was keel punched to accept a 50 mm stem because of generally soft bone and high body mass index.  Trial components in position.  The patient had good stability varus and valgus stress at 0, 30, and 90 degrees with a 13 spacer.  Patella was cut freehand from 22-13, 3 peg patella was placed.  With trial components in position, the patient had excellent patella tracking using no thumbs technique.  Excellent stability varus valgus stress at 0, 30, and 90 degrees.  Trial components were removed.  True components placed. Tourniquet was released.  Exparel was placed prior to placing the components into the capsule.  Thorough irrigation also was performed prior to releasing the tourniquet with 3 L of irrigating solution. After tourniquet release, additional irrigation was performed.  Incision was then closed using interrupted, inverted 0 Vicryl suture, 2-0 Vicryl suture and skin staples.  Solution of Marcaine, morphine, clonidine injected to the knee.  The patient tolerated the procedure well without immediate complication.  Bulky dressing, knee immobilizer placed. Leslie Ball's  assistance required at all times during the case for retraction of neurovascular structures, limb positioning, opening and closing.  Her assistance was a medical necessity.     Burnard Bunting, M.D.     GSD/MEDQ  D:  04/15/2013  T:  04/16/2013  Job:  [id number]

## 2013-04-16 NOTE — Progress Notes (Signed)
Subjective: Pt stable - pain controlled mod well   Objective: Vital signs in last 24 hours: Temp:  [97 F (36.1 C)-99.4 F (37.4 C)] 97.9 F (36.6 C) (11/26 0635) Pulse Rate:  [50-85] 85 (11/26 0635) Resp:  [9-20] 18 (11/26 0635) BP: (102-151)/(46-82) 119/68 mmHg (11/26 0635) SpO2:  [94 %-100 %] 95 % (11/26 0635) Weight:  [82.555 kg (182 lb)] 82.555 kg (182 lb) (11/25 2300)  Intake/Output from previous day: 11/25 0701 - 11/26 0700 In: 3475 [P.O.:480; I.V.:2820; IV Piggyback:100] Out: 2100 [Urine:2025; Blood:75] Intake/Output this shift:    Exam:  Dorsiflexion/Plantar flexion intact Compartment soft  Labs:  Recent Labs  04/16/13 0633  HGB 8.8*    Recent Labs  04/16/13 0633  WBC 7.9  RBC 3.59*  HCT 28.8*  PLT 378   No results found for this basename: NA, K, CL, CO2, BUN, CREATININE, GLUCOSE, CALCIUM,  in the last 72 hours  Recent Labs  04/16/13 0633  INR 1.48    Assessment/Plan: Plan PT and CPM today - follow hgb, cr pending   DEAN,GREGORY SCOTT 04/16/2013, 8:10 AM

## 2013-04-16 NOTE — Evaluation (Signed)
Physical Therapy Evaluation Patient Details Name: Leslie Ball MRN: [id number] DOB: 1940-10-06 Today's Date: 04/16/2013 Time: 3086-5784 PT Time Calculation (min): 62 min  PT Assessment / Plan / Recommendation History of Present Illness  R TKA  Clinical Impression  Pt is s/p TKA resulting in the deficits listed below (see PT Problem List).  Pt will benefit from skilled PT to increase their independence and safety with mobility to allow discharge to the venue listed below.   Pt is very slow moving and in a lot of pain while moving; Still, anticipate she should progress well enough to dc home with 24 hour family assist initially  Increased time taken with interpreter to fully explain knee precautions and what pt can expect from therapies     PT Assessment  Patient needs continued PT services    Follow Up Recommendations  Home health PT;Supervision/Assistance - 24 hour (initially)    Does the patient have the potential to tolerate intense rehabilitation      Barriers to Discharge        Equipment Recommendations  Rolling walker with 5" wheels;3in1 (PT)    Recommendations for Other Services OT consult   Frequency 7X/week    Precautions / Restrictions Precautions Precautions: Knee;Fall Required Braces or Orthoses: Knee Immobilizer - Right Knee Immobilizer - Right: On when out of bed or walking Restrictions RLE Weight Bearing: Weight bearing as tolerated   Pertinent Vitals/Pain 10+/10 R knee, especially with WBing patient repositioned for comfort and as optimal knee extension as possible      Mobility  Bed Mobility Bed Mobility: Supine to Sit;Sitting - Scoot to Edge of Bed Supine to Sit: 3: Mod assist;With rails Sitting - Scoot to Edge of Bed: 3: Mod assist;With rail Details for Bed Mobility Assistance: Step-by-step cues for technqiue Transfers Transfers: Sit to Stand;Stand to Sit Sit to Stand: 3: Mod assist;With upper extremity assist;From bed;From  chair/3-in-1;With armrests Stand to Sit: 3: Mod assist;To chair/3-in-1 Details for Transfer Assistance: Cues for safe hand placement; physcial assist to power-up; quite painful Ambulation/Gait Ambulation/Gait Assistance: 4: Min assist Ambulation Distance (Feet): 6 Feet Assistive device: Rolling walker Ambulation/Gait Assistance Details: Cues for gait sequence; Physical assist to advance RW; Quite painful in stance Gait Pattern: Step-to pattern    Exercises Total Joint Exercises Ankle Circles/Pumps: AROM;Both;10 reps Quad Sets: AAROM;Right;5 reps (Pt with difficulty understanding; will need reinforcement) Heel Slides: AAROM;Right (3 reps; quite painful) Straight Leg Raises: AAROM;Right   PT Diagnosis: Difficulty walking;Acute pain  PT Problem List: Decreased strength;Decreased range of motion;Decreased activity tolerance;Decreased balance;Decreased mobility;Decreased knowledge of use of DME;Decreased knowledge of precautions;Pain PT Treatment Interventions: DME instruction;Gait training;Functional mobility training;Therapeutic activities;Therapeutic exercise;Patient/family education     PT Goals(Current goals can be found in the care plan section) Acute Rehab PT Goals Patient Stated Goal: to walk PT Goal Formulation: With patient Time For Goal Achievement: 04/23/13 Potential to Achieve Goals: Good  Visit Information  Last PT Received On: 04/16/13 Assistance Needed: +1 History of Present Illness: R TKA       Prior Functioning  Home Living Family/patient expects to be discharged to:: Private residence Living Arrangements: Alone (family will stay with pt first few days postop) Available Help at Discharge: Family;Available 24 hours/day (first few days) Type of Home: House Home Access: Level entry Home Layout: One level Home Equipment: None Prior Function Level of Independence: Independent Communication Communication: Prefers language other than English;Interpreter utilized     [employer] Arousal/Alertness: Awake/alert Behavior During Therapy: WFL for tasks assessed/performed  Overall Cognitive Status: Within Functional Limits for tasks assessed    Extremity/Trunk Assessment Upper Extremity Assessment Upper Extremity Assessment: Defer to OT evaluation Lower Extremity Assessment Lower Extremity Assessment: RLE deficits/detail RLE Deficits / Details: decr AROM and strength, limited by pain postop RLE: Unable to fully assess due to pain   Balance    End of Session PT - End of Session Equipment Utilized During Treatment: Right knee immobilizer Activity Tolerance: Patient limited by pain Patient left: in chair;with call bell/phone within reach;Other (comment) [facility] Liaison talking with pt and daughter in law) Nurse Communication: Mobility status  GP     Van Clines [facility] [place],  [phone]  04/16/2013, 12:53 PM

## 2013-04-16 NOTE — Progress Notes (Signed)
ANTICOAGULATION CONSULT NOTE - Follow Up Consult  Pharmacy Consult for Coumadin Indication: VTE prophylaxis s/p TKA  No Known Allergies  Patient Measurements: Height: 5\' 1"  (154.9 cm) Weight: 182 lb (82.555 kg) IBW/kg (Calculated) : 47.8  Vital Signs: Temp: 97.9 F (36.6 C) (11/26 0635) Temp src: Oral (11/26 0635) BP: 119/68 mmHg (11/26 0635) Pulse Rate: 85 (11/26 0635)  Labs:  Recent Labs  04/16/13 0633  HGB 8.8*  HCT 28.8*  PLT 378  LABPROT 17.5*  INR 1.48  CREATININE 0.86    Estimated Creatinine Clearance: 58.4 ml/min (by C-G formula based on Cr of 0.86).   Medications:  Scheduled:  . bisoprolol  10 mg Oral QHS  . brinzolamide  1 drop Left Eye TID  . celecoxib  200 mg Oral Q12H  . darifenacin  7.5 mg Oral Daily  . hydrochlorothiazide  12.5 mg Oral QPC supper  . latanoprost  1 drop Both Eyes QHS  . mometasone-formoterol  2 puff Inhalation BID  . patient's guide to using coumadin book   Does not apply Once  . theophylline  150 mg Oral QHS  . warfarin  5 mg Oral q1800  . warfarin   Does not apply Once  . Warfarin - Pharmacist Dosing Inpatient   Does not apply q1800    Assessment: 72 yo F starting Coumadin for post-op VTE prophylaxis.  INR is trending up 1.28>1.48 after first dose of Coumadin.  Hgb down post-op as expected.  No bleeding noted.  Goal of Therapy:  INR 2-3 Monitor platelets by anticoagulation protocol: Yes   Plan:  Continue Coumadin 5mg  PO q1800. Continue daily INR. Plan for Coumadin education with patient today.  [employer], Pharm.D., BCPS Clinical Pharmacist Pager [phone] 04/16/2013 10:51 AM

## 2013-04-16 NOTE — Progress Notes (Signed)
Orthopedic Tech Progress Note Patient Details:  Leslie Ball December 27, 1940 [id number]  Patient ID: Leslie Ball, female   DOB: 1941-02-05, 72 y.o.   MRN: [id number] Placed pt's rle in cpm @0 -40 degrees; will increase as pt tolerates; rn notified  [name] 04/16/2013, 3:06 PM

## 2013-04-16 NOTE — Progress Notes (Signed)
Utilization review completed. Susane Bey, RN, BSN. 

## 2013-04-17 LAB — CBC
HCT: 25.5 % — ABNORMAL LOW (ref 36.0–46.0)
Hemoglobin: 7.9 g/dL — ABNORMAL LOW (ref 12.0–15.0)
MCH: 24.7 pg — ABNORMAL LOW (ref 26.0–34.0)
MCHC: 31 g/dL (ref 30.0–36.0)
RBC: 3.2 MIL/uL — ABNORMAL LOW (ref 3.87–5.11)
RDW: 17.7 % — ABNORMAL HIGH (ref 11.5–15.5)

## 2013-04-17 LAB — PROTIME-INR: INR: 2.06 — ABNORMAL HIGH (ref 0.00–1.49)

## 2013-04-17 MED ORDER — DIPHENHYDRAMINE HCL 25 MG PO CAPS
25.0000 mg | ORAL_CAPSULE | Freq: Four times a day (QID) | ORAL | Status: DC | PRN
  Administered 2013-04-17 – 2013-04-19 (×3): 25 mg via ORAL
  Filled 2013-04-17 (×3): qty 1

## 2013-04-17 NOTE — Progress Notes (Signed)
Patient ID: Leslie Ball, female   DOB: May 22, 1941, 72 y.o.   MRN: [id number] Postoperative day 2 right total knee arthroplasty. Hemoglobin is slightly decreased at 7.9 was 8.8 yesterday. No complaints of shortness of breath this morning. Will recheck hemoglobin tomorrow morning.

## 2013-04-17 NOTE — Progress Notes (Signed)
ANTICOAGULATION CONSULT NOTE - Follow Up Consult  Pharmacy Consult for Coumadin Indication: VTE prophylaxis s/p TKA  No Known Allergies  Patient Measurements: Height: 5\' 1"  (154.9 cm) Weight: 182 lb (82.555 kg) IBW/kg (Calculated) : 47.8  Vital Signs: Temp: 97.9 F (36.6 C) (11/27 1158) Temp src: Oral (11/27 1158) BP: 103/63 mmHg (11/27 1158) Pulse Rate: 59 (11/27 1158)  Labs:  Recent Labs  04/16/13 0633 04/17/13 0429  HGB 8.8* 7.9*  HCT 28.8* 25.5*  PLT 378 300  LABPROT 17.5* 22.6*  INR 1.48 2.06*  CREATININE 0.86  --     Estimated Creatinine Clearance: 58.4 ml/min (by C-G formula based on Cr of 0.86).   Medications:  Scheduled:  . bisoprolol  10 mg Oral QHS  . brinzolamide  1 drop Left Eye TID  . celecoxib  200 mg Oral Q12H  . darifenacin  7.5 mg Oral Daily  . hydrochlorothiazide  12.5 mg Oral QPC supper  . latanoprost  1 drop Both Eyes QHS  . mometasone-formoterol  2 puff Inhalation BID  . patient's guide to using coumadin book   Does not apply Once  . theophylline  150 mg Oral QHS  . warfarin  5 mg Oral q1800  . warfarin   Does not apply Once  . Warfarin - Pharmacist Dosing Inpatient   Does not apply q1800    Assessment: 72 yo F starting Coumadin for post-op VTE prophylaxis.  INR is therapeutic.  Hgb down post-op as expected, plt wnl.  No bleeding noted.  Goal of Therapy:  INR 2-3 Monitor platelets by anticoagulation protocol: Yes   Plan:  Continue Coumadin 5mg  PO q1800. Continue daily INR.

## 2013-04-17 NOTE — Progress Notes (Signed)
Physical Therapy Treatment Patient Details Name: Leslie Ball MRN: [id number] DOB: 1941-01-31 Today's Date: 04/17/2013 Time: 4098-1191 PT Time Calculation (min): 18 min  PT Assessment / Plan / Recommendation  History of Present Illness R TKA   PT Comments   Pt c/o feeling very tired today.  Pt refused LE there-ex due to being tired.  Cont with current POC to maximize functional mobility & ensure safe d/c home.     Follow Up Recommendations  Home health PT;Supervision/Assistance - 24 hour     Does the patient have the potential to tolerate intense rehabilitation     Barriers to Discharge        Equipment Recommendations  Rolling walker with 5" wheels;3in1 (PT)    Recommendations for Other Services OT consult  Frequency 7X/week   Progress towards PT Goals    Plan Current plan remains appropriate    Precautions / Restrictions Precautions Precautions: Knee;Fall Required Braces or Orthoses: Knee Immobilizer - Right Knee Immobilizer - Right: On when out of bed or walking Restrictions RLE Weight Bearing: Weight bearing as tolerated   Pertinent Vitals/Pain Reports pain when asked but did not rate.  Repositioned for comfort.      Mobility  Bed Mobility Bed Mobility: Sit to Supine Sit to Supine: 4: Min assist;HOB flat Details for Bed Mobility Assistance: (A) to lift RLE into bed.  Cues for positioning in bed.   Transfers Transfers: Sit to Stand;Stand to Sit Sit to Stand: 4: Min assist;With upper extremity assist;With armrests;From chair/3-in-1 Stand to Sit: With upper extremity assist;4: Min assist;To bed Details for Transfer Assistance: cues for hand placement & RLE positioning before sitting.  (A) to achieve standing & to position RLE with descent Ambulation/Gait Ambulation/Gait Assistance: 4: Min assist Ambulation Distance (Feet): 10 Feet Assistive device: Rolling walker Ambulation/Gait Assistance Details: Cues for sequencing, RW advancement.  Distance limited by pt  c/o being very "tired" & becoming tearful.   Gait Pattern: Step-to pattern Gait velocity: decreased Stairs: No Wheelchair Mobility Wheelchair Mobility: No      PT Goals (current goals can now be found in the care plan section) Acute Rehab PT Goals PT Goal Formulation: With patient Time For Goal Achievement: 04/23/13 Potential to Achieve Goals: Good  Visit Information  Last PT Received On: 04/17/13 Assistance Needed: +1 History of Present Illness: R TKA    Subjective Data      Cognition  Cognition Arousal/Alertness: Awake/alert Behavior During Therapy: WFL for tasks assessed/performed Overall Cognitive Status: Within Functional Limits for tasks assessed    Balance     End of Session PT - End of Session Equipment Utilized During Treatment: Right knee immobilizer Activity Tolerance: Patient limited by fatigue Patient left: in bed;with call bell/phone within reach Nurse Communication: Mobility status   GP     Lara Mulch 04/17/2013, 10:11 AM   Verdell Face, PTA [phone] 04/17/2013

## 2013-04-17 NOTE — Care Management Note (Signed)
Patient speaks arabic.Cm spoke with pt's son Dulce Sellar at [phone] concerning discharge planning to offer choice of [facility] agencies. Pt recommends HHPT. Per pt would like to discuss home health options with MD. Mosie Epstein Cm to follow up for further dc planning.   Roxy Manns Burnell Matlin,RN,MSN [phone]

## 2013-04-18 LAB — CBC
HCT: 25.8 % — ABNORMAL LOW (ref 36.0–46.0)
Hemoglobin: 8.1 g/dL — ABNORMAL LOW (ref 12.0–15.0)
MCH: 25 pg — ABNORMAL LOW (ref 26.0–34.0)
MCHC: 31.4 g/dL (ref 30.0–36.0)
MCV: 79.6 fL (ref 78.0–100.0)
RBC: 3.24 MIL/uL — ABNORMAL LOW (ref 3.87–5.11)

## 2013-04-18 LAB — PROTIME-INR: INR: 2.74 — ABNORMAL HIGH (ref 0.00–1.49)

## 2013-04-18 MED ORDER — WARFARIN SODIUM 2.5 MG PO TABS
2.5000 mg | ORAL_TABLET | Freq: Once | ORAL | Status: AC
  Administered 2013-04-18: 2.5 mg via ORAL
  Filled 2013-04-18 (×2): qty 1

## 2013-04-18 NOTE — Progress Notes (Signed)
CSW (Clinical Child psychotherapist) spoke with pt son Doreatha Martin. Pt son currently undecided on dc plan as he is not sure about facility that has offered a bed (only one bed offer at this time). CSW explained again the difficulty of finding a Medicaid bed and finding a facility that will offer therapy with Medicaid. Pt son is aware of this but still undecided. CSW spoke with pt nurse and at this time unsure of expected dc date. CSW has tried to prepare pt son for possible dc tomorrow but pt son stated he has not heard of dc date from MD therefore is not prepared to make a decision. Will be helpful if MD can speak with pt son.   Deandra Goering, LCSWA

## 2013-04-18 NOTE — Progress Notes (Signed)
Physical Therapy Note  Noted family request for looking into SNF for rehab; PT in agreement   04/18/13 0700  PT - Assessment/Plan  PT Plan Discharge plan needs to be updated  PT Frequency 7X/week  Follow Up Recommendations SNF;Supervision/Assistance - 24 hour  PT equipment Rolling walker with 5" wheels;3in1 (PT)   Van Clines, [place] [phone]

## 2013-04-18 NOTE — Progress Notes (Signed)
Clinical Social Work Department BRIEF PSYCHOSOCIAL ASSESSMENT 04/18/2013  Patient:  Leslie Ball, Leslie Ball     Account Number:  [id number]     Admit date:  04/15/2013  Clinical Social Worker:  Harless Nakayama  Date/Time:  04/18/2013 10:15 AM  Referred by:  Physician  Date Referred:  04/18/2013 Referred for  SNF Placement   Other Referral:   Interview type:  Patient Other interview type:   CSW spoke with pt but pt asked if CSW could call pt son    PSYCHOSOCIAL DATA Living Status:  ALONE Admitted from facility:   Level of care:   Primary support name:  Dulce Sellar [phone] Primary support relationship to patient:  CHILD, ADULT Degree of support available:   Pt has supportive family    CURRENT CONCERNS Current Concerns  Post-Acute Placement   Other Concerns:    SOCIAL WORK ASSESSMENT / PLAN CSW informed that pt will not have 24 hr supervision at home and will need SNF. CSW spoke with pt but pt asked CSW to call son because she speaks limited [place]. CSW called pt son, Sam, and spoke with him about SNF. Pt son already aware of recommendation. CSW informed of difficulty finding medicaid bed so will need to fax out to multiple counties and then present bed offers from there. Pt son understanding and informed CSW he would relay this information to his mother. CSW to meet with pt son at 11am in pt room to explain that Medicaid does not typically cover therapy and inquire if SNF is still the desired dc plan. CSW has called [facility] to inquire if they would be able to offer placement with therapy for pt. Toniann Fail at facility informed CSW she would review but her administrator is out and has to approve all Medicaid offers so unsure if she will be able to reach her and get approval today.   Assessment/plan status:  Psychosocial Support/Ongoing Assessment of Needs Other assessment/ plan:   Information/referral to community resources:   SNF list to be provided with bed offers.     PATIENT'S/FAMILY'S RESPONSE TO PLAN OF CARE: Pt son agreeable to SNF at this time and expressed that pt would be agreeable as well.       Calob Baskette, LCSWA [phone]

## 2013-04-18 NOTE — Progress Notes (Signed)
Physical Therapy Treatment Patient Details Name: Leslie Ball MRN: [id number] DOB: March 29, 1941 Today's Date: 04/18/2013 Time: 4098-1191 PT Time Calculation (min): 24 min  PT Assessment / Plan / Recommendation  History of Present Illness R TKA   PT Comments   Pt progressing with mobility as evident in her ability to increase ambulation distance.  Pt does require encouragement throughout session as she c/o being tired.      Follow Up Recommendations  SNF;Supervision/Assistance - 24 hour     Does the patient have the potential to tolerate intense rehabilitation     Barriers to Discharge        Equipment Recommendations  Rolling walker with 5" wheels;3in1 (PT)    Recommendations for Other Services OT consult  Frequency 7X/week   Progress towards PT Goals Progress towards PT goals: Progressing toward goals  Plan      Precautions / Restrictions Precautions Precautions: Knee;Fall Required Braces or Orthoses: Knee Immobilizer - Right Knee Immobilizer - Right: On when out of bed or walking Restrictions RLE Weight Bearing: Weight bearing as tolerated   Pertinent Vitals/Pain C/o Rt knee pain; did not rate but states "it's not severe"    Mobility  Bed Mobility Bed Mobility: Not assessed Details for Bed Mobility Assistance: Pt up in recliner upon arrival Transfers Transfers: Sit to Stand;Stand to Sit Sit to Stand: 4: Min guard;With upper extremity assist;With armrests;From chair/3-in-1 Stand to Sit: 4: Min guard;With upper extremity assist;With armrests;To chair/3-in-1 Details for Transfer Assistance: cues to reinforce hand placement & RLE positioning  Ambulation/Gait Ambulation/Gait Assistance: 4: Min guard Ambulation Distance (Feet): 30 Feet Assistive device: Rolling walker Ambulation/Gait Assistance Details: cues for sequencing, tall posture, RW advancement, & encouragement to increase RLE WBing.   Gait Pattern: Step-to pattern;Decreased step length - right;Decreased step  length - left;Decreased weight shift to right Gait velocity: decreased General Gait Details: appears effortful but only required min guard Stairs: No Wheelchair Mobility Wheelchair Mobility: No    Exercises Total Joint Exercises Ankle Circles/Pumps: AROM;Both;10 reps Heel Slides: AAROM;Right;Strengthening;10 reps Hip ABduction/ADduction: AAROM;Right;Strengthening;10 reps Straight Leg Raises: AAROM;Strengthening;Right;10 reps Long Arc Quad: AAROM;Strengthening;Right;10 reps       PT Goals (current goals can now be found in the care plan section) Acute Rehab PT Goals PT Goal Formulation: With patient Time For Goal Achievement: 04/23/13 Potential to Achieve Goals: Good  Visit Information  Last PT Received On: 04/18/13 Assistance Needed: +1 History of Present Illness: R TKA    Subjective Data      Cognition  Cognition Arousal/Alertness: Awake/alert Behavior During Therapy: WFL for tasks assessed/performed Overall Cognitive Status: Within Functional Limits for tasks assessed    Balance     End of Session PT - End of Session Equipment Utilized During Treatment: Right knee immobilizer Activity Tolerance: Patient tolerated treatment well Patient left: in chair;with call bell/phone within reach;with family/visitor present Nurse Communication: Mobility status   GP     Lara Mulch 04/18/2013, 11:35 AM  Verdell Face, PTA [phone] 04/18/2013

## 2013-04-18 NOTE — Progress Notes (Signed)
Patient ID: Leslie Ball, female   DOB: 10/04/1940, 72 y.o.   MRN: [id number] Slow progression with physical therapy. Patient's family states that she will be home alone during the day and will not have proper supervision. Family wishes to pursue skilled nursing placement. Order written for skilled nursing facility consultation.

## 2013-04-18 NOTE — Progress Notes (Signed)
Orthopedic Tech Progress Note Patient Details:  Leslie Ball [birth year] [id number]  Patient ID: Leslie Ball, female   DOB: 01-25-1941, 72 y.o.   MRN: [id number] Placed pt's rle in cpm @0 -40 degrees @ 1500; will increase as pt tolerates; rn notified  [name] 04/18/2013, 2:58 PM

## 2013-04-18 NOTE — Progress Notes (Addendum)
Clinical Social Work Department CLINICAL SOCIAL WORK PLACEMENT NOTE 04/18/2013  Patient:  Leslie Ball, Leslie Ball  Account Number:  [id number] Admit date:  04/15/2013  Clinical Social Worker:  Sharol Harness, Theresia Majors  Date/time:  04/18/2013 10:40 AM  Clinical Social Work is seeking post-discharge placement for this patient at the following level of care:   SKILLED NURSING   (*CSW will update this form in Epic as items are completed)   04/18/2013  Patient/family provided with [facility] Department of Clinical Social Work's list of facilities offering this level of care within the geographic area requested by the patient (or if unable, by the patient's family).  04/18/2013  Patient/family informed of their freedom to choose among providers that offer the needed level of care, that participate in Medicare, Medicaid or managed care program needed by the patient, have an available bed and are willing to accept the patient.  04/18/2013  Patient/family informed of [facility]' ownership interest in [facility], as well as of the fact that they are under no obligation to receive care at this facility.  PASARR submitted to EDS on 04/18/2013 PASARR number received from EDS on 04/18/2013  FL2 transmitted to all facilities in geographic area requested by pt/family on  04/18/2013 FL2 transmitted to all facilities within larger geographic area on   Patient informed that his/her managed care company has contracts with or will negotiate with  certain facilities, including the following:     Patient/family informed of bed offers received:  04/18/2013 Patient chooses bed at  Physician recommends and patient chooses bed at    Patient to be transferred to HOME  on  04/21/2013 Patient to be transferred to facility by   The following physician request were entered in Epic:   Additional Comments:  Jurnie Garritano, LCSWA [phone]

## 2013-04-18 NOTE — Progress Notes (Signed)
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   Medications:  Scheduled:  . bisoprolol  10 mg Oral QHS  . brinzolamide  1 drop Left Eye TID  . celecoxib  200 mg Oral Q12H  . darifenacin  7.5 mg Oral Daily  . hydrochlorothiazide  12.5 mg Oral QPC supper  . latanoprost  1 drop Both Eyes QHS  . mometasone-formoterol  2 puff Inhalation BID  . theophylline  150 mg Oral QHS  . warfarin  2.5 mg Oral ONCE-1800  . warfarin   Does not apply Once  . Warfarin - Pharmacist Dosing Inpatient   Does not apply q1800    Assessment: 72 yo F starting Coumadin for post-op VTE prophylaxis.  INR is therapeutic but has trended up significantly today.  Hgb down post-op as expected but now trending up, plt wnl.  No bleeding noted.  Goal of Therapy:  INR 2-3 Monitor platelets by anticoagulation protocol: Yes   Plan:  1) Coumadin 2.5 mg PO x 1 dose today. 2) Continue daily INR.  Vinnie Level, PharmD.  Clinical Pharmacist Pager [phone]

## 2013-04-18 NOTE — Progress Notes (Signed)
Occupational Therapy Treatment Patient Details Name: Leslie Ball MRN: [id number] DOB: [birth year] Today's Date: 04/18/2013 Time: 1019-1030 OT Time Calculation (min): 11 min  OT Assessment / Plan / Recommendation  History of present illness R TKA   OT comments  This 72 yo female making progress but slowly will continue to benefit from acute OT, but now with follow up OT at SNF.  Follow Up Recommendations  SNF       Equipment Recommendations  3 in 1 bedside comode;Tub/shower bench       Frequency Min 2X/week   Progress towards OT Goals Progress towards OT goals: Progressing toward goals  Plan Discharge plan needs to be updated    Precautions / Restrictions Precautions Precautions: Knee;Fall Required Braces or Orthoses: Knee Immobilizer - Right Knee Immobilizer - Right: On when out of bed or walking Restrictions RLE Weight Bearing: Weight bearing as tolerated       ADL  Toilet Transfer: Minimal assistance Toilet Transfer Method: Sit to stand Toilet Transfer Equipment: Bedside commode Toileting - Clothing Manipulation and Hygiene: Min guard Where Assessed - Toileting Clothing Manipulation and Hygiene: Sit to stand from 3-in-1 or toilet Equipment Used: Gait belt;Knee Immobilizer;Rolling walker Transfers/Ambulation Related to ADLs: Min A for all with RW and KI     OT Goals(current goals can now be found in the care plan section)    Visit Information  Last OT Received On: 04/18/13 Assistance Needed: +1 History of Present Illness: R TKA          Cognition  Cognition Arousal/Alertness: Awake/alert Behavior During Therapy: WFL for tasks assessed/performed Overall Cognitive Status: Within Functional Limits for tasks assessed    Mobility  Bed Mobility Details for Bed Mobility Assistance: Pt up in recliner upon arrival Transfers Transfers: Sit to Stand;Stand to Sit Sit to Stand: 4: Min assist;With upper extremity assist;With armrests;From chair/3-in-1 Stand to  Sit: 4: Min assist;With upper extremity assist;With armrests;To chair/3-in-1 Details for Transfer Assistance: VCs for safe hand placement          End of Session OT - End of Session Equipment Utilized During Treatment: Gait belt;Rolling walker;Right knee immobilizer Activity Tolerance: Patient tolerated treatment well Patient left: in chair;with call bell/phone within reach       Evette Georges [phone] 04/18/2013, 10:37 AM

## 2013-04-19 LAB — BODY FLUID CULTURE: Culture: NO GROWTH

## 2013-04-19 MED ORDER — WARFARIN SODIUM 4 MG PO TABS
4.0000 mg | ORAL_TABLET | Freq: Once | ORAL | Status: AC
  Administered 2013-04-19: 4 mg via ORAL
  Filled 2013-04-19: qty 1

## 2013-04-19 NOTE — Progress Notes (Signed)
Subjective: Pt stable - pain controlled   Objective: Vital signs in last 24 hours: Temp:  [97.6 F (36.4 C)-97.8 F (36.6 C)] 97.8 F (36.6 C) (11/29 0643) Pulse Rate:  [70-76] 70 (11/29 0643) Resp:  [17-18] 18 (11/29 0643) BP: (129-135)/(55-62) 135/55 mmHg (11/29 0643) SpO2:  [94 %-99 %] 99 % (11/29 0643)  Intake/Output from previous day: 11/28 0701 - 11/29 0700 In: 820 [P.O.:820] Out: -  Intake/Output this shift:    Exam:  Neurovascular intact Sensation intact distally Intact pulses distally Dorsiflexion/Plantar flexion intact No cellulitis present  Labs:  Recent Labs  04/17/13 0429 04/18/13 0400  HGB 7.9* 8.1*    Recent Labs  04/17/13 0429 04/18/13 0400  WBC 5.9 6.4  RBC 3.20* 3.24*  HCT 25.5* 25.8*  PLT 300 291   No results found for this basename: NA, K, CL, CO2, BUN, CREATININE, GLUCOSE, CALCIUM,  in the last 72 hours  Recent Labs  04/18/13 0400 04/19/13 0735  INR 2.74* 2.81*    Assessment/Plan: Slow progress - inr ok - incision ok - may not be ready for daytime independence at home - continue PT today and Sunday - dc to SNF or home with 24 hour care Monday   Leslie Ball SCOTT 04/19/2013, 9:14 AM

## 2013-04-19 NOTE — Progress Notes (Signed)
Physical Therapy Treatment Patient Details Name: JOUD PETTINATO MRN: [id number] DOB: 06/26/1940 Today's Date: 04/19/2013 Time: 4098-1191 PT Time Calculation (min): 17 min  PT Assessment / Plan / Recommendation  History of Present Illness R TKA   PT Comments   Pt making steady progress toward her goals.  Follow Up Recommendations  SNF;Supervision/Assistance - 24 hour     Equipment Recommendations  Rolling walker with 5" wheels;3in1 (PT)    Frequency 7X/week   Progress towards PT Goals Progress towards PT goals: Progressing toward goals  Plan Current plan remains appropriate    Precautions / Restrictions Precautions Precautions: Knee;Fall Required Braces or Orthoses: Knee Immobilizer - Right Knee Immobilizer - Right: On when out of bed or walking Restrictions Weight Bearing Restrictions: Yes RLE Weight Bearing: Weight bearing as tolerated    Mobility  Bed Mobility Bed Mobility: Not assessed Details for Bed Mobility Assistance: Pt up in recliner upon arrival Transfers Sit to Stand: 5: Supervision;From chair/3-in-1;With upper extremity assist;With armrests Stand to Sit: To chair/3-in-1;4: Min guard;With upper extremity assist;With armrests Details for Transfer Assistance: cues to reinforce hand placement & RLE positioning  Ambulation/Gait Ambulation/Gait Assistance: 4: Min guard Ambulation Distance (Feet): 36 Feet Assistive device: Rolling walker Ambulation/Gait Assistance Details: min cues for gait sequence, posture and walker position with gait. Gait Pattern: Step-to pattern;Decreased step length - right;Decreased step length - left;Decreased weight shift to right;Antalgic Stairs: No    Exercises Total Joint Exercises Ankle Circles/Pumps: AROM;Seated;Both;10 reps Quad Sets: AROM;Seated;Both;10 reps Heel Slides: AAROM;Strengthening;Right;10 reps;Seated Hip ABduction/ADduction: AAROM;Strengthening;Right;10 reps;Seated Straight Leg Raises: AAROM;Strengthening;Right;10  reps;Seated     PT Goals (current goals can now be found in the care plan section) Acute Rehab PT Goals Patient Stated Goal: not stated PT Goal Formulation: With patient Time For Goal Achievement: 04/23/13 Potential to Achieve Goals: Good  Visit Information  Last PT Received On: 04/19/13 Assistance Needed: +1 History of Present Illness: R TKA    Subjective Data  Patient Stated Goal: not stated   Cognition  Cognition Arousal/Alertness: Awake/alert Behavior During Therapy: WFL for tasks assessed/performed Overall Cognitive Status: Within Functional Limits for tasks assessed    End of Session PT - End of Session Equipment Utilized During Treatment: Gait belt;Right knee immobilizer Activity Tolerance: Patient tolerated treatment well Patient left: in chair;with call bell/phone within reach;with nursing/sitter in room Nurse Communication: Mobility status;Patient requests pain meds CPM Right Knee CPM Right Knee: Off Right Knee Flexion (Degrees): 45 Right Knee Extension (Degrees): 0   GP     Sallyanne Kuster 04/19/2013, 12:14 PM  Sallyanne Kuster, PTA Office- [phone]

## 2013-04-19 NOTE — Progress Notes (Signed)
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   Medications:  Scheduled:  . bisoprolol  10 mg Oral QHS  . brinzolamide  1 drop Left Eye TID  . celecoxib  200 mg Oral Q12H  . darifenacin  7.5 mg Oral Daily  . hydrochlorothiazide  12.5 mg Oral QPC supper  . latanoprost  1 drop Both Eyes QHS  . mometasone-formoterol  2 puff Inhalation BID  . theophylline  150 mg Oral QHS  . warfarin   Does not apply Once  . Warfarin - Pharmacist Dosing Inpatient   Does not apply q1800    Assessment: 72 yo F starting Coumadin for post-op VTE prophylaxis. Patient received 3 doses of 5mg  and INR has quickly trended to therapeutic range in 4 days at 2.81. INR only changed by 0.1 yesterday. In hope INR will stabilize will give a reduced 4mg  dose today. No signs of bleed noted.  Goal of Therapy:  INR 2-3 Monitor platelets by anticoagulation protocol: Yes   Plan:  1) Coumadin 4 mg po x 1 dose  2) Daily INR  3) Monitor for signs of bleed  Forestine Na M 04/19/2013,11:03 AM

## 2013-04-20 LAB — ANAEROBIC CULTURE

## 2013-04-20 LAB — PROTIME-INR
INR: 3.69 — ABNORMAL HIGH (ref 0.00–1.49)
Prothrombin Time: 35.2 seconds — ABNORMAL HIGH (ref 11.6–15.2)

## 2013-04-20 MED ORDER — OXYCODONE HCL 5 MG PO TABS: 5.0000 mg | ORAL_TABLET | ORAL | Status: DC | PRN

## 2013-04-20 MED ORDER — WARFARIN SODIUM 5 MG PO TABS: 2.5000 mg | ORAL_TABLET | Freq: Every day | ORAL | Status: DC

## 2013-04-20 MED ORDER — WARFARIN SODIUM 5 MG PO TABS: 5.0000 mg | ORAL_TABLET | Freq: Every day | ORAL | Status: DC

## 2013-04-20 MED ORDER — MAGNESIUM CITRATE PO SOLN
1.0000 | Freq: Once | ORAL | Status: AC
  Administered 2013-04-20: 1 via ORAL
  Filled 2013-04-20: qty 296

## 2013-04-20 MED ORDER — METHOCARBAMOL 500 MG PO TABS: 500.0000 mg | ORAL_TABLET | Freq: Four times a day (QID) | ORAL | Status: DC | PRN

## 2013-04-20 NOTE — Progress Notes (Signed)
Subjective: Pt stable - pain controlled   Objective: Vital signs in last 24 hours: Temp:  [97.7 F (36.5 C)-98.7 F (37.1 C)] 98.1 F (36.7 C) (11/30 0642) Pulse Rate:  [58-65] 62 (11/30 0642) Resp:  [17-18] 17 (11/30 0642) BP: (101-125)/(48-67) 125/67 mmHg (11/30 0642) SpO2:  [98 %-100 %] 98 % (11/30 0642)  Intake/Output from previous day: 11/29 0701 - 11/30 0700 In: 600 [P.O.:600] Out: -  Intake/Output this shift: Total I/O In: 320 [P.O.:320] Out: -   Exam:  Neurovascular intact Sensation intact distally Intact pulses distally Dorsiflexion/Plantar flexion intact  Labs:  Recent Labs  04/18/13 0400  HGB 8.1*    Recent Labs  04/18/13 0400  WBC 6.4  RBC 3.24*  HCT 25.8*  PLT 291   No results found for this basename: NA, K, CL, CO2, BUN, CREATININE, GLUCOSE, CALCIUM,  in the last 72 hours  Recent Labs  04/19/13 0735 04/20/13 0545  INR 2.81* 3.69*    Assessment/Plan: Plan mag citrate today for bm - dc pending from ortho standpoint for mon/tues depending on snf bed availability and/or daytime assistance at home   [facility] SCOTT 04/20/2013, 9:56 AM

## 2013-04-20 NOTE — Progress Notes (Signed)
Physical Therapy Treatment Patient Details Name: Leslie Ball MRN: [id number] DOB: 11-08-1940 Today's Date: 04/20/2013 Time: 4098-1191 PT Time Calculation (min): 26 min  PT Assessment / Plan / Recommendation  History of Present Illness R TKA   PT Comments   Continuing progress, and pt seems more agreeable to going home with HHPT follow-up;   Case Mgr: Does Ms. Allmon qualify for any HHAide?  Will need to find out how much help son and daughter-in-law can give pt   Follow Up Recommendations  Home health PT;Other (comment) (Must still consider SNF if more help is not available to pt)     Does the patient have the potential to tolerate intense rehabilitation     Barriers to Discharge        Equipment Recommendations  Rolling walker with 5" wheels;3in1 (PT) (already delivered to room)    Recommendations for Other Services    Frequency 7X/week   Progress towards PT Goals Progress towards PT goals: Progressing toward goals  Plan Discharge plan needs to be updated    Precautions / Restrictions Precautions Precautions: Knee Required Braces or Orthoses: Knee Immobilizer - Right Knee Immobilizer - Right: On when out of bed or walking Restrictions RLE Weight Bearing: Weight bearing as tolerated   Pertinent Vitals/Pain 5/10 R knee; patient repositioned for comfort and optimal knee extension    Mobility  Bed Mobility Bed Mobility: Supine to Sit;Sitting - Scoot to Edge of Bed;Sit to Supine Supine to Sit: 4: Min guard;With rails Sitting - Scoot to Edge of Bed: 4: Min guard Sit to Supine: 4: Min assist;HOB flat Details for Bed Mobility Assistance: Cues for technique; noted very good improvement with much smoother motion Transfers Transfers: Sit to Stand;Stand to Sit Sit to Stand: 5: Supervision;From bed Stand to Sit: 4: Min guard;To chair/3-in-1;With armrests Details for Transfer Assistance: cues to reinforce hand placement & RLE positioning   Ambulation/Gait Ambulation/Gait Assistance: 4: Min guard;5: Supervision Ambulation Distance (Feet): 80 Feet Assistive device: Rolling walker Ambulation/Gait Assistance Details: Minguard assist progressing to Supervision; Tried using shorter youth RW with no significant change in gait and posture; No loss of balance noted; continued cues for upright posture Gait Pattern: Step-to pattern;Step-through pattern Gait velocity: decreased General Gait Details: appears effortful but only required min guard    Exercises Total Joint Exercises Quad Sets: AROM;Right;10 reps;Supine Short Arc Quad: AROM;Right;10 reps;Supine Heel Slides: AROM;Right;10 reps;Supine Straight Leg Raises: AAROM;Right;10 reps;Supine   PT Diagnosis:    PT Problem List:   PT Treatment Interventions:     PT Goals (current goals can now be found in the care plan section) Acute Rehab PT Goals PT Goal Formulation: With patient Time For Goal Achievement: 04/23/13 Potential to Achieve Goals: Good  Visit Information  Last PT Received On: 04/20/13 Assistance Needed: +1 History of Present Illness: R TKA    Subjective Data  Subjective: Discussed dc plan, and pt frankly asked me if I would recommend Home or SNF for her; Based on her progress, and potential for progress, I recommend home with HHPT and looking into getting an aide   Cognition  Cognition Arousal/Alertness: Awake/alert Behavior During Therapy: WFL for tasks assessed/performed Overall Cognitive Status: Within Functional Limits for tasks assessed    Balance     End of Session PT - End of Session Equipment Utilized During Treatment: Right knee immobilizer Activity Tolerance: Patient tolerated treatment well Patient left: in bed;with call bell/phone within reach Nurse Communication: Mobility status   GP     Van Clines  [address] [place], [place] [phone]  04/20/2013, 4:53 PM

## 2013-04-20 NOTE — Discharge Summary (Signed)
Physician Discharge Summary  Patient ID: [name] MRN: [id number] DOB/AGE: 71-Jun-1942 71 y.o.  Admit date: 04/15/2013 Discharge date: 04/21/2013 Admission Diagnoses:  Active Problems:   Arthritis of left knee   Discharge Diagnoses:  Same  Surgeries: Procedure(s): TOTAL KNEE ARTHROPLASTY on 04/15/2013   Consultants:    Discharged Condition: Stable  Hospital Course: Leslie Ball is an 72 y.o. female who was admitted 04/15/2013 with a chief complaint of left knee pain, and found to have a diagnosis of  Left knee arthritis.  They were brought to the operating room on 04/15/2013 and underwent the above named procedures. She was slow to mobilize with PT and was started on coumadin for dvt prophylaxis. Incision ok at time of discharge.   Antibiotics given:  Anti-infectives   Start     Dose/Rate Route Frequency Ordered Stop   04/15/13 2200  ceFAZolin (ANCEF) IVPB 2 g/50 mL premix     2 g 100 mL/hr over 30 Minutes Intravenous 3 times per day 04/15/13 1757 04/16/13 0640   04/15/13 1006  ceFAZolin (ANCEF) 2-3 GM-% IVPB SOLR    Comments:  Leslie Ball   : cabinet override      04/15/13 1006 04/15/13 2214   04/15/13 0600  ceFAZolin (ANCEF) IVPB 2 g/50 mL premix     2 g 100 mL/hr over 30 Minutes Intravenous On call to O.R. 04/14/13 1422 04/15/13 1029   04/15/13 0600  ceFAZolin (ANCEF) IVPB 2 g/50 mL premix  Status:  Discontinued     2 g 100 mL/hr over 30 Minutes Intravenous On call to O.R. 04/14/13 1837 04/15/13 1736    .  Recent vital signs:  Filed Vitals:   04/20/13 0642  BP: 125/67  Pulse: 62  Temp: 98.1 F (36.7 C)  Resp: 17    Recent laboratory studies:  Results for orders placed during the hospital encounter of 04/15/13  GRAM STAIN      Result Value Range   Specimen Description FLUID SYNOVIAL KNEE RIGHT     Special Requests PT ON ANCEF     Gram Stain       Value: MODERATE WBC PRESENT,BOTH PMN AND MONONUCLEAR     NO ORGANISMS SEEN     CALLED TO OR  04/15/13 1203 BY Leslie Ball   Report Status 04/15/2013 FINAL    ANAEROBIC CULTURE      Result Value Range   Specimen Description FLUID SYNOVIAL KNEE RIGHT     Special Requests PT ON ANCEF     Gram Stain PENDING     Culture       Value: NO ANAEROBES ISOLATED; CULTURE IN PROGRESS FOR 5 DAYS     Performed at [facility]   Report Status PENDING    BODY FLUID CULTURE      Result Value Range   Specimen Description FLUID SYNOVIAL KNEE RIGHT     Special Requests PT ON ANCEF     Gram Stain       Value: WBC PRESENT,BOTH PMN AND MONONUCLEAR     NO ORGANISMS SEEN     Performed at [facility]     Performed at [facility]   Culture       Value: NO GROWTH 3 DAYS     Note: Gram Stain Report Called to,Read Back By and Verified With: OR 04/15/13 1203 BY Leslie Ball     Performed at [facility]   Report Status 04/19/2013 FINAL    PROTIME-INR  Result Value Range   Prothrombin Time 17.5 (*) 11.6 - 15.2 seconds   INR 1.48  0.00 - 1.49  CBC      Result Value Range   WBC 7.9  4.0 - 10.5 Leslie/uL   RBC 3.59 (*) 3.87 - 5.11 MIL/uL   Hemoglobin 8.8 (*) 12.0 - 15.0 g/dL   HCT 40.9 (*) 81.1 - 91.4 %   MCV 80.2  78.0 - 100.0 fL   MCH 24.5 (*) 26.0 - 34.0 pg   MCHC 30.6  30.0 - 36.0 g/dL   RDW 78.2 (*) 95.6 - 21.3 %   Platelets 378  150 - 400 Leslie/uL  BASIC METABOLIC PANEL      Result Value Range   Sodium 139  135 - 145 mEq/L   Potassium 4.1  3.5 - 5.1 mEq/L   Chloride 103  96 - 112 mEq/L   CO2 25  19 - 32 mEq/L   Glucose, Bld 103 (*) 70 - 99 mg/dL   BUN 16  6 - 23 mg/dL   Creatinine, Ser 0.86  0.50 - 1.10 mg/dL   Calcium 8.9  8.4 - 57.8 mg/dL   GFR calc non Af Amer 66 (*) >90 mL/min   GFR calc Af Amer 77 (*) >90 mL/min  PROTIME-INR      Result Value Range   Prothrombin Time 22.6 (*) 11.6 - 15.2 seconds   INR 2.06 (*) 0.00 - 1.49  CBC      Result Value Range   WBC 5.9  4.0 - 10.5 Leslie/uL   RBC 3.20 (*) 3.87 - 5.11 MIL/uL   Hemoglobin 7.9 (*) 12.0 - 15.0 g/dL    HCT 46.9 (*) 62.9 - 46.0 %   MCV 79.7  78.0 - 100.0 fL   MCH 24.7 (*) 26.0 - 34.0 pg   MCHC 31.0  30.0 - 36.0 g/dL   RDW 52.8 (*) 41.3 - 24.4 %   Platelets 300  150 - 400 Leslie/uL  PROTIME-INR      Result Value Range   Prothrombin Time 28.1 (*) 11.6 - 15.2 seconds   INR 2.74 (*) 0.00 - 1.49  CBC      Result Value Range   WBC 6.4  4.0 - 10.5 Leslie/uL   RBC 3.24 (*) 3.87 - 5.11 MIL/uL   Hemoglobin 8.1 (*) 12.0 - 15.0 g/dL   HCT 01.0 (*) 27.2 - 53.6 %   MCV 79.6  78.0 - 100.0 fL   MCH 25.0 (*) 26.0 - 34.0 pg   MCHC 31.4  30.0 - 36.0 g/dL   RDW 64.4 (*) 03.4 - 74.2 %   Platelets 291  150 - 400 Leslie/uL  PROTIME-INR      Result Value Range   Prothrombin Time 28.6 (*) 11.6 - 15.2 seconds   INR 2.81 (*) 0.00 - 1.49  PROTIME-INR      Result Value Range   Prothrombin Time 35.2 (*) 11.6 - 15.2 seconds   INR 3.69 (*) 0.00 - 1.49    Discharge Medications:     Medication List    STOP taking these medications       traMADol 50 MG tablet  Commonly known as:  ULTRAM      TAKE these medications       bisoprolol 10 MG tablet  Commonly known as:  ZEBETA  Take 10 mg by mouth at bedtime.     brinzolamide 1 % ophthalmic suspension  Commonly known as:  AZOPT  Place 1 drop into  the left eye 3 (three) times daily.     Fluticasone-Salmeterol 250-50 MCG/DOSE Aepb  Commonly known as:  ADVAIR  Inhale 1 puff into the lungs every 12 (twelve) hours as needed (shortness of breath/wheezing).     hydrochlorothiazide 12.5 MG capsule  Commonly known as:  MICROZIDE  Take 12.5 mg by mouth daily after supper.     methocarbamol 500 MG tablet  Commonly known as:  ROBAXIN  Take 1 tablet (500 mg total) by mouth every 6 (six) hours as needed for muscle spasms.     oxyCODONE 5 MG immediate release tablet  Commonly known as:  Oxy IR/ROXICODONE  Take 1-2 tablets (5-10 mg total) by mouth every 3 (three) hours as needed for moderate pain or breakthrough pain.     solifenacin 5 MG tablet  Commonly known as:   VESICARE  Take 5 mg by mouth at bedtime.     theophylline 300 MG 12 hr tablet  Commonly known as:  THEODUR  Take 150 mg by mouth at bedtime.     Travoprost (BAK Free) 0.004 % Soln ophthalmic solution  Commonly known as:  TRAVATAN  Place 1 drop into the left eye at bedtime.     warfarin 5 MG tablet  Commonly known as:  COUMADIN  Take 1 tablet (5 mg total) by mouth daily.        Diagnostic Studies: No results found.  Disposition: Final discharge disposition not confirmed      Discharge Orders   Future Orders Complete By Expires   Call MD / Call 911  As directed    Comments:     If you experience chest pain or shortness of breath, CALL 911 and be transported to the hospital emergency room.  If you develope a fever above 101 F, pus (white drainage) or increased drainage or redness at the wound, or calf pain, call your surgeon's office.   Constipation Prevention  As directed    Comments:     Drink plenty of fluids.  Prune juice may be helpful.  You may use a stool softener, such as Colace (over the counter) 100 mg twice a day.  Use MiraLax (over the counter) for constipation as needed.   Diet - low sodium heart healthy  As directed    Discharge instructions  As directed    Comments:     Weight bearing as tolerated with crutches/ walker Keep incision dry   Increase activity slowly as tolerated  As directed          Signed: DEAN,GREGORY SCOTT 04/20/2013, 10:05 AM

## 2013-04-20 NOTE — Progress Notes (Signed)
ANTICOAGULATION CONSULT NOTE - Follow Up Consult  Pharmacy Consult for Coumadin Indication: VTE prophylaxis s/p TKA  No Known Allergies  Patient Measurements: Height: 5\' 1"  (154.9 cm) Weight: 182 lb (82.555 kg) IBW/kg (Calculated) : 47.8  Vital Signs: Temp: 98.1 Ball (36.7 C) (11/30 0642) BP: 125/67 mmHg (11/30 0642) Pulse Rate: 62 (11/30 0642)  Labs:  Recent Labs  04/18/13 0400 04/19/13 0735 04/20/13 0545  HGB 8.1*  --   --   HCT 25.8*  --   --   PLT 291  --   --   LABPROT 28.1* 28.6* 35.2*  INR 2.74* 2.81* 3.69*    Estimated Creatinine Clearance: 58.4 ml/min (by C-G formula based on Cr of 0.86).   Medications:  Scheduled:  . bisoprolol  10 mg Oral QHS  . brinzolamide  1 drop Left Eye TID  . celecoxib  200 mg Oral Q12H  . darifenacin  7.5 mg Oral Daily  . hydrochlorothiazide  12.5 mg Oral QPC supper  . latanoprost  1 drop Both Eyes QHS  . mometasone-formoterol  2 puff Inhalation BID  . theophylline  150 mg Oral QHS  . warfarin   Does not apply Once  . Warfarin - Pharmacist Dosing Inpatient   Does not apply q1800    Assessment: 72 yo Ball starting Coumadin for post-op VTE prophylaxis. Patient received 3 doses of 5mg  and INR has quickly trended to therapeutic range in 4 days at 2.81. INR supratherapeutic 3.69. Will hold today's dose, per MD looks like patient d/c home tmw. Will rec'd a reduced dose of 2.5 mg PO daily, Ball/u w/ outside clinic soon atleast within 1 wk. Per nurse tech, pt has been constipated w/ hemorrhoids. Noticed small amounts of blood in stool.  Goal of Therapy:  INR 2-3 Monitor platelets by anticoagulation protocol: Yes   Plan:  1) Hold coumadin dose today 2) Rec'd a reduced dose of 2.5 mg PO daily 3) Ball/U on INR atleast within a week if not sooner 3) Monitor for signs of bleed, daily PT/INR  Forestine Na M 04/20/2013,12:22 PM

## 2013-04-20 NOTE — Progress Notes (Signed)
Physical Therapy Treatment Patient Details Name: Leslie Ball MRN: [id number] DOB: 1941/03/27 Today's Date: 04/20/2013 Time: 4098-1191 PT Time Calculation (min): 33 min  PT Assessment / Plan / Recommendation  History of Present Illness R TKA   PT Comments   Making progress with mobility today, not requiring physical assist with mobility acts  SNF for rehab continues to be appropriate as pt lives alone, and must be at least modified independent to dc home; The patient herself seems ok with dc to SNF  Still, taking her good progress into account, and that her son is not comfortable with her going to SNF for rehab, it would not be unreasonable for pt to dc home with assist the greater part of the day (if not 24 hour assist) --  Does pt qualify for a HHAide?  Could pt's son and daughter-in-law stay with her? Or do they have resources to hire help? (Leslie Ball states she doesn't want to be "heavy" for her son)  I have made sure to praise her progress    Follow Up Recommendations  SNF;Supervision/Assistance - 24 hour     Does the patient have the potential to tolerate intense rehabilitation     Barriers to Discharge        Equipment Recommendations  Rolling walker with 5" wheels;3in1 (PT);Other (comment) (may need short RW -- will assess next session)    Recommendations for Other Services    Frequency 7X/week   Progress towards PT Goals Progress towards PT goals: Progressing toward goals  Plan Current plan remains appropriate;Other (comment) (Will continue to assess progress each session)    Precautions / Restrictions Precautions Precautions: Knee;Fall Required Braces or Orthoses: Knee Immobilizer - Right Knee Immobilizer - Right: On when out of bed or walking Restrictions RLE Weight Bearing: Weight bearing as tolerated   Pertinent Vitals/Pain 5-6/10 R knee; patient repositioned for comfort and optimal extension    Mobility  Bed Mobility Bed Mobility: Supine to  Sit;Sitting - Scoot to Edge of Bed Supine to Sit: 4: Min guard;With rails Sitting - Scoot to Edge of Bed: 4: Min guard Details for Bed Mobility Assistance: Cues for technique; noted very good improvement with much smoother motion Transfers Transfers: Sit to Stand;Stand to Sit Sit to Stand: 5: Supervision;From bed Stand to Sit: 4: Min guard;To chair/3-in-1;With armrests Details for Transfer Assistance: cues to reinforce hand placement & RLE positioning  Ambulation/Gait Ambulation/Gait Assistance: 4: Min guard Ambulation Distance (Feet): 75 Feet Assistive device: Rolling walker Ambulation/Gait Assistance Details: Frequent cues for posture and RW proximity; Noted pt is progressing to step-through pattern naturally Gait Pattern: Step-to pattern;Step-through pattern Gait velocity: decreased    Exercises Total Joint Exercises Heel Slides: AROM;Right;10 reps;Supine Straight Leg Raises: AAROM;Right;10 reps;Supine   PT Diagnosis:    PT Problem List:   PT Treatment Interventions:     PT Goals (current goals can now be found in the care plan section) Acute Rehab PT Goals PT Goal Formulation: With patient Time For Goal Achievement: 04/23/13 Potential to Achieve Goals: Good  Visit Information  Last PT Received On: 04/20/13 Assistance Needed: +1 History of Present Illness: R TKA    Subjective Data  Subjective: Pt seems happy with the idea of going to a rehab center for therapy and then to home   Cognition  Cognition Arousal/Alertness: Awake/alert Behavior During Therapy: [facility] for tasks assessed/performed Overall Cognitive Status: Within Functional Limits for tasks assessed    Balance     End of Session PT - End of  Session Equipment Utilized During Treatment: Right knee immobilizer Activity Tolerance: Patient tolerated treatment well Patient left: in chair;with call bell/phone within reach Nurse Communication: Mobility status   GP    [employer], [place] [phone]  Van Clines [facility] 04/20/2013, 12:39 PM

## 2013-04-21 LAB — PROTIME-INR: INR: 2.12 — ABNORMAL HIGH (ref 0.00–1.49)

## 2013-04-21 MED ORDER — WARFARIN SODIUM 4 MG PO TABS
4.0000 mg | ORAL_TABLET | Freq: Once | ORAL | Status: DC
  Filled 2013-04-21: qty 1

## 2013-04-21 NOTE — Progress Notes (Signed)
Physical Therapy Treatment Patient Details Name: Leslie Ball MRN: [id number] DOB: 1940/10/14 Today's Date: 04/21/2013 Time: 1017-1040 PT Time Calculation (min): 23 min  PT Assessment / Plan / Recommendation  History of Present Illness R TKA   PT Comments   Pt cont's to make progress with mobility but she does fatigue quickly & requires encouragement to increase ambulation distance.  Due to progress, feel pt is appropriate to d/c home with if she has appropriate assist/supervision.    Follow Up Recommendations  Home health PT;Other (comment)     Does the patient have the potential to tolerate intense rehabilitation     Barriers to Discharge        Equipment Recommendations  Rolling walker with 5" wheels;3in1 (PT)    Recommendations for Other Services    Frequency 7X/week   Progress towards PT Goals Progress towards PT goals: Progressing toward goals  Plan      Precautions / Restrictions Precautions Precautions: Knee Required Braces or Orthoses: Knee Immobilizer - Right Knee Immobilizer - Right: On when out of bed or walking Restrictions RLE Weight Bearing: Weight bearing as tolerated   Pertinent Vitals/Pain Reports mild pain in Rt knee    Mobility  Bed Mobility Bed Mobility: Sit to Supine Sit to Supine: 5: Supervision;HOB flat Details for Bed Mobility Assistance: Pt able to lift RLE back into bed without therapists' assist Transfers Transfers: Sit to Stand;Stand to Sit Sit to Stand: 5: Supervision;With upper extremity assist;With armrests;From chair/3-in-1 Stand to Sit: 5: Supervision;With upper extremity assist;To bed Details for Transfer Assistance: cues to reinforce hand placement Ambulation/Gait Ambulation/Gait Assistance: 5: Supervision Ambulation Distance (Feet): 80 Feet Assistive device: Rolling walker Ambulation/Gait Assistance Details: cues for sequencing, encouragement to increase RLE WBing, increase RLE heel strike, & decrease lateral lean to Lt with  Rt swing phase Gait Pattern: Step-through pattern;Decreased stride length;Decreased hip/knee flexion - right;Lateral trunk lean to left;Trunk flexed General Gait Details: Ambulated without use of KI due to pt able to perform SLR's without extension lag- No knee buckling noted.  Pt fatigues quickly.  Encouragement to increase distance Stairs: No Wheelchair Mobility Wheelchair Mobility: No    Exercises Total Joint Exercises Ankle Circles/Pumps: AROM;Both;10 reps Heel Slides: AROM;Strengthening;Right;10 reps Hip ABduction/ADduction: AROM;Strengthening;Right;10 reps Straight Leg Raises: AROM;Strengthening;Right;10 reps Long Arc Quad: AROM;Strengthening;Right;10 reps     PT Goals (current goals can now be found in the care plan section) Acute Rehab PT Goals PT Goal Formulation: With patient Time For Goal Achievement: 04/23/13 Potential to Achieve Goals: Good  Visit Information  Last PT Received On: 04/21/13 Assistance Needed: +1 History of Present Illness: R TKA    Subjective Data      Cognition  Cognition Arousal/Alertness: Awake/alert Behavior During Therapy: WFL for tasks assessed/performed Overall Cognitive Status: Within Functional Limits for tasks assessed    Balance     End of Session PT - End of Session Activity Tolerance: Patient tolerated treatment well Patient left: in bed;with call bell/phone within reach Nurse Communication: Mobility status   GP     Lara Mulch 04/21/2013, 2:01 PM  Verdell Face, PTA [phone] 04/21/2013

## 2013-04-21 NOTE — Care Management Note (Signed)
04/21/13  Vance Peper, RN BSN Case Manager spoke with patient's son- Sam concerning discharge plan for patient. He states she will be going home with He and wife. CM reminded him that he was given private duty list, should he need further assistance at home. Patient has been setup with [facility], has DME from [facility]. Will notify both agencies that patient will discharge today.

## 2013-04-21 NOTE — Progress Notes (Signed)
OT Cancellation Note  Patient Details Name: Leslie Ball MRN: [id number] DOB: Dec 30, 1940   Cancelled Treatment:    Reason Eval/Treat Not Completed: Patient declined, no reason specified                                           Pt. In CPM machine and requests no therapy right now.  Will try back when able.  Robet Leu, COTA/L 04/21/2013, 3:18 PM

## 2013-04-21 NOTE — Progress Notes (Signed)
ANTICOAGULATION CONSULT NOTE - Follow Up Consult  Pharmacy Consult for Warfarin Indication: VTE prophylaxis s/p R-TKA on 11/25  No Known Allergies  Patient Measurements: Height: 5\' 1"  (154.9 cm) Weight: 182 lb (82.555 kg) IBW/kg (Calculated) : 47.8  Vital Signs: Temp: 97.6 F (36.4 C) (12/01 0605) BP: 117/59 mmHg (12/01 0605) Pulse Rate: 53 (12/01 0605)  Labs:  Recent Labs  04/19/13 0735 04/20/13 0545 04/21/13 0825  LABPROT 28.6* 35.2* 23.1*  INR 2.81* 3.69* 2.12*    Estimated Creatinine Clearance: 58.4 ml/min (by C-G formula based on Cr of 0.86).   Assessment: 72 y.o. F who continues on warfarin for VTE prophylaxis s/p R-TKA on 11/25. INR today is therapeutic after holding the dose on 11/30 (INR 2.12 << 3.69, goal of 2-3). No CBC since 11/28 -- no overt s/sx of bleeding noted at this time.  It is noted that the patient is to be discharged home today. Given the patient's trends in INR this admission, a dose of 2.5 mg daily is likely more suited than the current dose of 5 mg daily on the discharge summary. This was discussed with Dr. August Saucer who will adjust this on the discharge summary.  Goal of Therapy:  INR 2-3 Monitor platelets by anticoagulation protocol: Yes   Plan:  1. Warfarin 4 mg x 1 dose if still here this evening 2. Would still recommend 2.5 mg daily upon discharge 3. Will continue to monitor for any signs/symptoms of bleeding and will follow up with PT/INR in the a.m (if still here)  Georgina Pillion, PharmD, BCPS Clinical Pharmacist Pager: [phone] 04/21/2013 2:18 PM

## 2013-04-21 NOTE — Progress Notes (Signed)
Orthopedic Tech Progress Note Patient Details:  Leslie Ball 1940-06-12 [id number]  Patient ID: Leslie Ball, female   DOB: 01-Aug-1940, 72 y.o.   MRN: [id number] Placed pt's rle in cpm @0 -45 degrees @1455   [name] 04/21/2013, 2:51 PM

## 2013-05-28 ENCOUNTER — Ambulatory Visit: Payer: Medicaid Other | Admitting: Physical Therapy

## 2013-06-16 ENCOUNTER — Other Ambulatory Visit (HOSPITAL_COMMUNITY): Payer: Self-pay | Admitting: Orthopedic Surgery

## 2013-07-05 NOTE — Pre-Procedure Instructions (Signed)
[place] - Preparing for Surgery  Before surgery, you can play an important role.  Because skin is not sterile, your skin needs to be as free of germs as possible.  You can reduce the number of germs on you skin by washing with CHG (chlorahexidine gluconate) soap before surgery.  CHG is an antiseptic cleaner which kills germs and bonds with the skin to continue killing germs even after washing.  Please DO NOT use if you have an allergy to CHG or antibacterial soaps.  If your skin becomes reddened/irritated stop using the CHG and inform your nurse when you arrive at Short Stay.  Do not shave (including legs and underarms) for at least 48 hours prior to the first CHG shower.  You may shave your face.  Please follow these instructions carefully:   1.  Shower with CHG Soap the night before surgery and the morning of Surgery.  2.  If you choose to wash your hair, wash your hair first as usual with your normal shampoo.  3.  After you shampoo, rinse your hair and body thoroughly to remove the shampoo.  4.  Use CHG as you would any other liquid soap.  You can apply CHG directly to the skin and wash gently with scrungie or a clean washcloth.  5.  Apply the CHG Soap to your body ONLY FROM THE NECK DOWN.  Do not use on open wounds or open sores.  Avoid contact with your eyes, ears, mouth and genitals (private parts).  Wash genitals (private parts) with your normal soap.  6.  Wash thoroughly, paying special attention to the area where your surgery will be performed.  7.  Thoroughly rinse your body with warm water from the neck down.  8.  DO NOT shower/wash with your normal soap after using and rinsing off the CHG Soap.  9.  Pat yourself dry with a clean towel.            10.  Wear clean pajamas.            11.  Place clean sheets on your bed the night of your first shower and do not sleep with pets.  Day of Surgery  Do not apply any lotions the morning of surgery.  Please wear clean clothes to the  hospital/surgery center.   

## 2013-07-07 ENCOUNTER — Encounter (HOSPITAL_COMMUNITY)
Admission: RE | Admit: 2013-07-07 | Discharge: 2013-07-07 | Disposition: A | Discharge status: Home / Self Care | Payer: Medicaid Other | Source: Ambulatory Visit | Attending: Orthopedic Surgery | Admitting: Orthopedic Surgery

## 2013-07-07 ENCOUNTER — Encounter (HOSPITAL_COMMUNITY): Payer: Self-pay

## 2013-07-07 DIAGNOSIS — Z01812 Encounter for preprocedural laboratory examination: Secondary | ICD-10-CM | POA: Insufficient documentation

## 2013-07-07 LAB — URINALYSIS, ROUTINE W REFLEX MICROSCOPIC
BILIRUBIN URINE: NEGATIVE
Glucose, UA: NEGATIVE mg/dL
HGB URINE DIPSTICK: NEGATIVE
Ketones, ur: NEGATIVE mg/dL
Leukocytes, UA: NEGATIVE
NITRITE: NEGATIVE
Protein, ur: NEGATIVE mg/dL
SPECIFIC GRAVITY, URINE: 1.022 (ref 1.005–1.030)
Urobilinogen, UA: 1 mg/dL (ref 0.0–1.0)
pH: 7 (ref 5.0–8.0)

## 2013-07-07 LAB — BASIC METABOLIC PANEL
BUN: 12 mg/dL (ref 6–23)
CO2: 28 meq/L (ref 19–32)
CREATININE: 0.7 mg/dL (ref 0.50–1.10)
Calcium: 9.2 mg/dL (ref 8.4–10.5)
Chloride: 97 mEq/L (ref 96–112)
GFR calc Af Amer: >90 mL/min (ref >90)
GFR calc non Af Amer: 85 mL/min — ABNORMAL LOW (ref >90)
GLUCOSE: 87 mg/dL (ref 70–99)
POTASSIUM: 3.9 meq/L (ref 3.7–5.3)
Sodium: 137 mEq/L (ref 137–147)

## 2013-07-07 LAB — TYPE AND SCREEN
ABO/RH(D): O POS
Antibody Screen: NEGATIVE

## 2013-07-07 LAB — PROTIME-INR
INR: 1.22 (ref 0.00–1.49)
Prothrombin Time: 15.1 seconds (ref 11.6–15.2)

## 2013-07-07 LAB — CBC
HEMATOCRIT: 32 % — AB (ref 36.0–46.0)
Hemoglobin: 10.1 g/dL — ABNORMAL LOW (ref 12.0–15.0)
MCH: 24.6 pg — ABNORMAL LOW (ref 26.0–34.0)
MCHC: 31.6 g/dL (ref 30.0–36.0)
MCV: 78 fL (ref 78.0–100.0)
Platelets: 396 10*3/uL (ref 150–400)
RBC: 4.1 MIL/uL (ref 3.87–5.11)
RDW: 17.2 % — AB (ref 11.5–15.5)
WBC: 5.3 10*3/uL (ref 4.0–10.5)

## 2013-07-07 LAB — SURGICAL PCR SCREEN
MRSA, PCR: NEGATIVE
Staphylococcus aureus: NEGATIVE

## 2013-07-07 LAB — APTT: APTT: 44 s — AB (ref 24–37)

## 2013-07-07 NOTE — Pre-Procedure Instructions (Signed)
Leslie Ball  07/07/2013   Your procedure is scheduled on:  Tuesday February 24 th at 1130 AM  Report to [facility] Short Stay Main Entrance "A" at 0930 AM.  Call this number if you have problems the morning of surgery: [phone]   Remember:   Do not eat food or drink liquids after midnight.   Take these medicines the morning of surgery with A SIP OF WATER:Patient takes BP meds at night. May take Tramadol if needed for pain. May use Advair inhaler and bring inhaler . Use eye drops and bring day of surgery.   Do not wear jewelry, make-up or nail polish.  Do not wear lotions, powders, or perfumes. You may wear deodorant.  Do not shave 48 hours prior to surgery. Men may shave face and neck.  Do not bring valuables to the hospital.  [facility] is not responsible   for any belongings or valuables.               Contacts, dentures or bridgework may not be worn into surgery.  Leave suitcase in the car. After surgery it may be brought to your room.  For patients admitted to the hospital, discharge time is determined by your  treatment team.               Patients discharged the day of surgery will not be allowed to drive home.    Special Instructions:    Please read over the following fact sheets that you were given: Pain Booklet, Coughing and Deep Breathing, Blood Transfusion Information, MRSA Information and Surgical Site Infection Prevention

## 2013-07-08 LAB — URINE CULTURE
COLONY COUNT: NO GROWTH
Culture: NO GROWTH

## 2013-07-08 NOTE — Progress Notes (Signed)
Anesthesia Chart Review:  Patient is a 73 year old female scheduled for left TKA on 07/15/13 by Dr. August Saucer.  She is s/p right TKA on 04/15/13.  Please refer to my preoperative note from 03/18/13.  She was seen by cardiologist Dr. Jacinto Halim prior to that surgery due to abnormal EKG.  She had a nuclear stress test on 03/31/13 that showed: Resting EKG NSR, non-diagnostic for ischemia. Normal isotope update both at rest and stress.  No evidence of ischemia or scar, Normal wall motion and endocardial thickening.  LVEF 59%. Based on those results she was felt to be acceptable (low) risk for right TKA in 03/2013.  PRN cardiology follow-up was recommended. (Records scanned under Media tab.)  Preoperative labs noted.  H/H 10.1/32.0 (her baseline hgb was 10.7 in 02/2013).  PT/INR WNL.  PTT 44 (was 42 prior to her right TKA in 03/2013). She is not on anticoagulation therapy, so will defer any additional orders, if any, to Dr. Madelon Lips. Urine culture is still pending.  She had a normal stress test in 03/2013 and has since tolerated right TKA.  If no acute changes then I anticipate that she can proceed as planned.  Velna Ochs [facility] Short Stay Center/Anesthesiology Phone [phone] 07/08/2013 11:22 AM

## 2013-07-14 MED ORDER — CEFAZOLIN SODIUM-DEXTROSE 2-3 GM-% IV SOLR
2.0000 g | INTRAVENOUS | Status: AC
  Administered 2013-07-15: 2 g via INTRAVENOUS
  Filled 2013-07-14: qty 50

## 2013-07-14 NOTE — H&P (Addendum)
TOTAL KNEE ADMISSION H&P  Patient is being admitted for left total knee arthroplasty.  Subjective:  Chief Complaint:left knee pain.  HPI: Leslie Ball, 73 y.o. female, has a history of pain and functional disability in the left knee due to arthritis and has failed non-surgical conservative treatments for greater than 12 weeks to includeNSAID's and/or analgesics, corticosteriod injections, supervised PT with diminished ADL's post treatment, use of assistive devices and activity modification.  Onset of symptoms was gradual, starting >10 years ago with rapidlly worsening course since that time. The patient noted no past surgery on the left knee(s).  Patient currently rates pain in the left knee(s) at 10 out of 10 with activity. Patient has night pain, worsening of pain with activity and weight bearing, pain that interferes with activities of daily living, pain with passive range of motion, crepitus and joint swelling.  Patient has evidence of subchondral cysts, subchondral sclerosis, periarticular osteophytes, joint subluxation and joint space narrowing by imaging studies. This patient has had good result and pain relief from right TKA. There is no active infection.  Patient Active Problem List   Diagnosis Date Noted  . Arthritis of left knee 04/15/2013   Past Medical History  Diagnosis Date  . Hypertension   . Asthma   . Constipation     occasionally  . Neuromuscular disorder     right hand has "pins and needles" feeling at times  . Arthritis   . 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 GERD (gastroesophageal reflux disease)     rare use of Pepto bismol  . Anemia     Past Surgical History  Procedure Laterality Date  . Eye surgery      bilateral eye  . Total knee arthroplasty Right 04/15/2013    Procedure: TOTAL KNEE ARTHROPLASTY;  Surgeon: Cammy Copa, MD;  Location: [facility] OR;  Service: Orthopedics;   Laterality: Right;    No prescriptions prior to admission   Allergies  Allergen Reactions  . Chlorhexidine Itching    REACTION: redness    History  Substance Use Topics  . Smoking status: Never Smoker   . Smokeless tobacco: Never Used  . Alcohol Use: No    No family history on file.   Review of Systems  Constitutional: Negative.   HENT: Negative.   Eyes: Negative.   Respiratory: Negative.   Cardiovascular: Negative.   Gastrointestinal: Negative.   Musculoskeletal: Positive for joint pain.  Skin: Negative.   Neurological: Negative.   Endo/Heme/Allergies: Negative.   Psychiatric/Behavioral: Negative.     Objective:  Physical Exam  Constitutional: She appears well-developed.  HENT:  Head: Normocephalic.  Eyes: Pupils are equal, round, and reactive to light.  Neck: Normal range of motion.  Cardiovascular: Normal rate.   Respiratory: Effort normal.  Neurological: She is alert.  Skin: Skin is warm.  Psychiatric: She has a normal mood and affect.  left knee rom 7 - 90 - skin ok - ext mechanism ok - df/pf ok - collaterals stable  Vital signs in last 24 hours:    Labs:   Estimated body mass index is 34.41 kg/(m^2) as calculated from the following:   Height as of 04/15/13: 5\' 1"  (1.549 m).   Weight as of 04/09/13: 82.555 kg (182 lb).   Imaging Review Plain radiographs demonstrate severe degenerative joint disease of the left knee(s). The overall alignment ismild varus. The  bone quality appears to be fair for age and reported activity level.  Assessment/Plan:  End stage arthritis, left knee   The patient history, physical examination, clinical judgment of the provider and imaging studies are consistent with end stage degenerative joint disease of the left knee(s) and total knee arthroplasty is deemed medically necessary. The treatment options including medical management, injection therapy arthroscopy and arthroplasty were discussed at length. The risks and  benefits of total knee arthroplasty were presented and reviewed. The risks due to aseptic loosening, infection, stiffness, patella tracking problems, thromboembolic complications and other imponderables were discussed. The patient acknowledged the explanation, agreed to proceed with the plan and consent was signed. Patient is being admitted for inpatient treatment for surgery, pain control, PT, OT, prophylactic antibiotics, VTE prophylaxis, progressive ambulation and ADL's and discharge planning. The patient is planning to be discharged to skilled nursing facility No family history of dvt or pulmonary embolism. - Pt has low hgb pre op but is becoming increasingly incapacitated by her left knee pain. She does live at home by herself and has done well with right TKA 3 months ago. - Will plan to use either pre op transxemic acid or thrombin spray interop to minimize blood loss

## 2013-07-15 ENCOUNTER — Encounter (HOSPITAL_COMMUNITY)
Admission: RE | Disposition: A | Discharge status: Home Health | Payer: Self-pay | Source: Ambulatory Visit | Attending: Orthopedic Surgery

## 2013-07-15 ENCOUNTER — Encounter (HOSPITAL_COMMUNITY): Payer: Self-pay | Admitting: Certified Registered"

## 2013-07-15 ENCOUNTER — Inpatient Hospital Stay (HOSPITAL_COMMUNITY)
Admission: RE | Admit: 2013-07-15 | Discharge: 2013-07-21 | DRG: 470 | Disposition: A | Discharge status: Home Health | Payer: Medicaid Other | Source: Ambulatory Visit | Attending: Orthopedic Surgery | Admitting: Orthopedic Surgery

## 2013-07-15 ENCOUNTER — Encounter (HOSPITAL_COMMUNITY): Payer: Medicaid Other | Admitting: Vascular Surgery

## 2013-07-15 ENCOUNTER — Inpatient Hospital Stay (HOSPITAL_COMMUNITY): Payer: Medicaid Other | Admitting: Certified Registered"

## 2013-07-15 DIAGNOSIS — J45909 Unspecified asthma, uncomplicated: Secondary | ICD-10-CM | POA: Diagnosis present

## 2013-07-15 DIAGNOSIS — D649 Anemia, unspecified: Secondary | ICD-10-CM | POA: Diagnosis present

## 2013-07-15 DIAGNOSIS — M1710 Unilateral primary osteoarthritis, unspecified knee: Principal | ICD-10-CM | POA: Diagnosis present

## 2013-07-15 DIAGNOSIS — R1013 Epigastric pain: Secondary | ICD-10-CM | POA: Diagnosis present

## 2013-07-15 DIAGNOSIS — K5900 Constipation, unspecified: Secondary | ICD-10-CM

## 2013-07-15 DIAGNOSIS — H5440 Blindness, one eye, unspecified eye: Secondary | ICD-10-CM | POA: Diagnosis present

## 2013-07-15 DIAGNOSIS — R112 Nausea with vomiting, unspecified: Secondary | ICD-10-CM

## 2013-07-15 DIAGNOSIS — K219 Gastro-esophageal reflux disease without esophagitis: Secondary | ICD-10-CM | POA: Diagnosis present

## 2013-07-15 DIAGNOSIS — H409 Unspecified glaucoma: Secondary | ICD-10-CM | POA: Diagnosis present

## 2013-07-15 DIAGNOSIS — I10 Essential (primary) hypertension: Secondary | ICD-10-CM | POA: Diagnosis present

## 2013-07-15 DIAGNOSIS — Z96659 Presence of unspecified artificial knee joint: Secondary | ICD-10-CM

## 2013-07-15 HISTORY — PX: TOTAL KNEE ARTHROPLASTY: SHX125

## 2013-07-15 SURGERY — ARTHROPLASTY, KNEE, TOTAL
Anesthesia: General | Site: Knee | Laterality: Left

## 2013-07-15 MED ORDER — ACETAMINOPHEN 650 MG RE SUPP: 650.0000 mg | Freq: Four times a day (QID) | RECTAL | Status: DC | PRN

## 2013-07-15 MED ORDER — WARFARIN SODIUM 5 MG PO TABS
5.0000 mg | ORAL_TABLET | Freq: Once | ORAL | Status: AC
  Administered 2013-07-15: 5 mg via ORAL
  Filled 2013-07-15: qty 1

## 2013-07-15 MED ORDER — OXYCODONE HCL 5 MG/5ML PO SOLN: 5.0000 mg | Freq: Once | ORAL | Status: AC | PRN

## 2013-07-15 MED ORDER — BUPIVACAINE LIPOSOME 1.3 % IJ SUSP
20.0000 mL | Freq: Once | INTRAMUSCULAR | Status: DC
  Filled 2013-07-15: qty 20

## 2013-07-15 MED ORDER — SODIUM CHLORIDE 0.9 % IJ SOLN
INTRAMUSCULAR | Status: DC | PRN
  Administered 2013-07-15: 13:00:00

## 2013-07-15 MED ORDER — ACETAMINOPHEN 325 MG PO TABS: 650.0000 mg | ORAL_TABLET | Freq: Four times a day (QID) | ORAL | Status: DC | PRN

## 2013-07-15 MED ORDER — ROCURONIUM BROMIDE 100 MG/10ML IV SOLN
INTRAVENOUS | Status: DC | PRN
  Administered 2013-07-15: 40 mg via INTRAVENOUS

## 2013-07-15 MED ORDER — GLYCOPYRROLATE 0.2 MG/ML IJ SOLN
INTRAMUSCULAR | Status: AC
  Filled 2013-07-15: qty 1

## 2013-07-15 MED ORDER — MOMETASONE FURO-FORMOTEROL FUM 100-5 MCG/ACT IN AERO
2.0000 | INHALATION_SPRAY | Freq: Two times a day (BID) | RESPIRATORY_TRACT | Status: DC
  Administered 2013-07-16 – 2013-07-21 (×10): 2 via RESPIRATORY_TRACT
  Filled 2013-07-15: qty 8.8

## 2013-07-15 MED ORDER — MIDAZOLAM HCL 2 MG/2ML IJ SOLN
INTRAMUSCULAR | Status: AC
  Filled 2013-07-15: qty 2

## 2013-07-15 MED ORDER — METHOCARBAMOL 100 MG/ML IJ SOLN
500.0000 mg | Freq: Four times a day (QID) | INTRAVENOUS | Status: DC | PRN
  Filled 2013-07-15: qty 5

## 2013-07-15 MED ORDER — MORPHINE SULFATE 4 MG/ML IJ SOLN
INTRAMUSCULAR | Status: DC | PRN
  Administered 2013-07-15: 8 mg

## 2013-07-15 MED ORDER — CHLORHEXIDINE GLUCONATE 4 % EX LIQD
60.0000 mL | Freq: Once | CUTANEOUS | Status: DC
  Filled 2013-07-15: qty 60

## 2013-07-15 MED ORDER — LIDOCAINE HCL (CARDIAC) 20 MG/ML IV SOLN
INTRAVENOUS | Status: AC
  Filled 2013-07-15: qty 5

## 2013-07-15 MED ORDER — FENTANYL CITRATE 0.05 MG/ML IJ SOLN
INTRAMUSCULAR | Status: DC | PRN
  Administered 2013-07-15 (×2): 50 ug via INTRAVENOUS
  Administered 2013-07-15: 25 ug via INTRAVENOUS
  Administered 2013-07-15: 50 ug via INTRAVENOUS
  Administered 2013-07-15: 25 ug via INTRAVENOUS
  Administered 2013-07-15 (×2): 50 ug via INTRAVENOUS
  Administered 2013-07-15: 100 ug via INTRAVENOUS

## 2013-07-15 MED ORDER — METOCLOPRAMIDE HCL 5 MG/ML IJ SOLN: 5.0000 mg | Freq: Three times a day (TID) | INTRAMUSCULAR | Status: DC | PRN

## 2013-07-15 MED ORDER — CLONIDINE HCL (ANALGESIA) 100 MCG/ML EP SOLN
EPIDURAL | Status: DC | PRN
  Administered 2013-07-15: 1 mL

## 2013-07-15 MED ORDER — 0.9 % SODIUM CHLORIDE (POUR BTL) OPTIME
TOPICAL | Status: DC | PRN
  Administered 2013-07-15: 1000 mL

## 2013-07-15 MED ORDER — ONDANSETRON HCL 4 MG/2ML IJ SOLN: 4.0000 mg | Freq: Four times a day (QID) | INTRAMUSCULAR | Status: DC | PRN

## 2013-07-15 MED ORDER — METOCLOPRAMIDE HCL 5 MG/ML IJ SOLN: 10.0000 mg | Freq: Once | INTRAMUSCULAR | Status: DC | PRN

## 2013-07-15 MED ORDER — CELECOXIB 200 MG PO CAPS
200.0000 mg | ORAL_CAPSULE | Freq: Two times a day (BID) | ORAL | Status: DC
  Administered 2013-07-15 – 2013-07-21 (×11): 200 mg via ORAL
  Filled 2013-07-15 (×13): qty 1

## 2013-07-15 MED ORDER — ONDANSETRON HCL 4 MG/2ML IJ SOLN
INTRAMUSCULAR | Status: DC | PRN
  Administered 2013-07-15: 4 mg via INTRAVENOUS

## 2013-07-15 MED ORDER — NEOSTIGMINE METHYLSULFATE 1 MG/ML IJ SOLN
INTRAMUSCULAR | Status: DC | PRN
  Administered 2013-07-15: 3 mg via INTRAVENOUS

## 2013-07-15 MED ORDER — WARFARIN - PHARMACIST DOSING INPATIENT: Freq: Every day | Status: DC

## 2013-07-15 MED ORDER — POTASSIUM CHLORIDE IN NACL 20-0.9 MEQ/L-% IV SOLN
INTRAVENOUS | Status: AC
  Administered 2013-07-15: 19:00:00 via INTRAVENOUS
  Filled 2013-07-15 (×2): qty 1000

## 2013-07-15 MED ORDER — HYDROMORPHONE HCL PF 1 MG/ML IJ SOLN
INTRAMUSCULAR | Status: AC
  Administered 2013-07-15: 0.5 mg via INTRAVENOUS
  Filled 2013-07-15: qty 1

## 2013-07-15 MED ORDER — PROPOFOL 10 MG/ML IV BOLUS
INTRAVENOUS | Status: AC
  Filled 2013-07-15: qty 20

## 2013-07-15 MED ORDER — LACTATED RINGERS IV SOLN
INTRAVENOUS | Status: DC
  Administered 2013-07-15 (×3): via INTRAVENOUS

## 2013-07-15 MED ORDER — WARFARIN VIDEO: Freq: Once | Status: DC

## 2013-07-15 MED ORDER — GLYCOPYRROLATE 0.2 MG/ML IJ SOLN
INTRAMUSCULAR | Status: DC | PRN
  Administered 2013-07-15: 0.4 mg via INTRAVENOUS
  Administered 2013-07-15: 0.2 mg via INTRAVENOUS

## 2013-07-15 MED ORDER — METHOCARBAMOL 500 MG PO TABS
500.0000 mg | ORAL_TABLET | Freq: Four times a day (QID) | ORAL | Status: DC | PRN
  Administered 2013-07-15 – 2013-07-19 (×8): 500 mg via ORAL
  Filled 2013-07-15 (×9): qty 1

## 2013-07-15 MED ORDER — PROPOFOL 10 MG/ML IV BOLUS
INTRAVENOUS | Status: DC | PRN
  Administered 2013-07-15: 160 mg via INTRAVENOUS

## 2013-07-15 MED ORDER — PHENOL 1.4 % MT LIQD: 1.0000 | OROMUCOSAL | Status: DC | PRN

## 2013-07-15 MED ORDER — LIDOCAINE HCL (CARDIAC) 20 MG/ML IV SOLN
INTRAVENOUS | Status: DC | PRN
  Administered 2013-07-15: 40 mg via INTRAVENOUS

## 2013-07-15 MED ORDER — COUMADIN BOOK
Freq: Once | Status: DC
  Filled 2013-07-15: qty 1

## 2013-07-15 MED ORDER — HYDROMORPHONE HCL PF 1 MG/ML IJ SOLN
INTRAMUSCULAR | Status: AC
  Filled 2013-07-15: qty 1

## 2013-07-15 MED ORDER — FENTANYL CITRATE 0.05 MG/ML IJ SOLN
INTRAMUSCULAR | Status: AC
  Filled 2013-07-15: qty 5

## 2013-07-15 MED ORDER — DEXAMETHASONE SODIUM PHOSPHATE 4 MG/ML IJ SOLN
INTRAMUSCULAR | Status: AC
  Filled 2013-07-15: qty 2

## 2013-07-15 MED ORDER — SODIUM CHLORIDE 0.9 % IR SOLN
Status: DC | PRN
  Administered 2013-07-15: 3000 mL

## 2013-07-15 MED ORDER — BUPIVACAINE HCL (PF) 0.25 % IJ SOLN
INTRAMUSCULAR | Status: AC
  Filled 2013-07-15: qty 30

## 2013-07-15 MED ORDER — THROMBIN 20000 UNITS EX KIT
PACK | CUTANEOUS | Status: AC
  Filled 2013-07-15: qty 1

## 2013-07-15 MED ORDER — FENTANYL CITRATE 0.05 MG/ML IJ SOLN
INTRAMUSCULAR | Status: AC
  Filled 2013-07-15: qty 2

## 2013-07-15 MED ORDER — OXYCODONE HCL 5 MG PO TABS
ORAL_TABLET | ORAL | Status: AC
  Filled 2013-07-15: qty 1

## 2013-07-15 MED ORDER — MORPHINE SULFATE 4 MG/ML IJ SOLN
INTRAMUSCULAR | Status: AC
  Filled 2013-07-15: qty 2

## 2013-07-15 MED ORDER — DEXAMETHASONE SODIUM PHOSPHATE 4 MG/ML IJ SOLN
INTRAMUSCULAR | Status: DC | PRN
  Administered 2013-07-15: 8 mg via INTRAVENOUS

## 2013-07-15 MED ORDER — ROCURONIUM BROMIDE 50 MG/5ML IV SOLN
INTRAVENOUS | Status: AC
  Filled 2013-07-15: qty 1

## 2013-07-15 MED ORDER — THROMBIN 20000 UNITS EX KIT
PACK | CUTANEOUS | Status: DC | PRN
  Administered 2013-07-15: 20000 [IU] via TOPICAL

## 2013-07-15 MED ORDER — OXYCODONE HCL 5 MG PO TABS
5.0000 mg | ORAL_TABLET | Freq: Once | ORAL | Status: AC | PRN
  Administered 2013-07-15: 5 mg via ORAL

## 2013-07-15 MED ORDER — METOCLOPRAMIDE HCL 10 MG PO TABS
5.0000 mg | ORAL_TABLET | Freq: Three times a day (TID) | ORAL | Status: DC | PRN
  Administered 2013-07-20 – 2013-07-21 (×2): 10 mg via ORAL
  Filled 2013-07-15 (×2): qty 1

## 2013-07-15 MED ORDER — DOCUSATE SODIUM 100 MG PO CAPS
100.0000 mg | ORAL_CAPSULE | Freq: Two times a day (BID) | ORAL | Status: DC
  Administered 2013-07-16 – 2013-07-21 (×10): 100 mg via ORAL
  Filled 2013-07-15 (×13): qty 1

## 2013-07-15 MED ORDER — OXYCODONE HCL 5 MG PO TABS
5.0000 mg | ORAL_TABLET | ORAL | Status: DC | PRN
  Administered 2013-07-15 – 2013-07-18 (×13): 10 mg via ORAL
  Filled 2013-07-15 (×13): qty 2

## 2013-07-15 MED ORDER — DEXTROSE 5 % IV SOLN
INTRAVENOUS | Status: DC | PRN
  Administered 2013-07-15: 11:00:00 via INTRAVENOUS

## 2013-07-15 MED ORDER — TRANEXAMIC ACID 100 MG/ML IV SOLN
1000.0000 mg | INTRAVENOUS | Status: AC
  Administered 2013-07-15: 1000 mg via INTRAVENOUS
  Filled 2013-07-15: qty 10

## 2013-07-15 MED ORDER — THEOPHYLLINE ER 300 MG PO TB12
150.0000 mg | ORAL_TABLET | Freq: Every day | ORAL | Status: DC
  Administered 2013-07-15 – 2013-07-19 (×5): 150 mg via ORAL
  Filled 2013-07-15 (×7): qty 1

## 2013-07-15 MED ORDER — MORPHINE SULFATE 2 MG/ML IJ SOLN
2.0000 mg | INTRAMUSCULAR | Status: DC | PRN
  Administered 2013-07-15 – 2013-07-16 (×7): 2 mg via INTRAVENOUS
  Filled 2013-07-15 (×7): qty 1

## 2013-07-15 MED ORDER — MIDAZOLAM HCL 5 MG/5ML IJ SOLN
INTRAMUSCULAR | Status: DC | PRN
  Administered 2013-07-15 (×2): 1 mg via INTRAVENOUS

## 2013-07-15 MED ORDER — MENTHOL 3 MG MT LOZG: 1.0000 | LOZENGE | OROMUCOSAL | Status: DC | PRN

## 2013-07-15 MED ORDER — BUPIVACAINE HCL (PF) 0.25 % IJ SOLN
INTRAMUSCULAR | Status: DC | PRN
  Administered 2013-07-15: 10 mL

## 2013-07-15 MED ORDER — PHENYLEPHRINE HCL 10 MG/ML IJ SOLN
INTRAMUSCULAR | Status: DC | PRN
  Administered 2013-07-15: 80 ug via INTRAVENOUS

## 2013-07-15 MED ORDER — HYDROMORPHONE HCL PF 1 MG/ML IJ SOLN
0.2500 mg | INTRAMUSCULAR | Status: DC | PRN
  Administered 2013-07-15 (×4): 0.5 mg via INTRAVENOUS

## 2013-07-15 MED ORDER — CEFAZOLIN SODIUM 1-5 GM-% IV SOLN
1.0000 g | Freq: Three times a day (TID) | INTRAVENOUS | Status: AC
  Administered 2013-07-15 – 2013-07-16 (×2): 1 g via INTRAVENOUS
  Filled 2013-07-15 (×2): qty 50

## 2013-07-15 MED ORDER — ONDANSETRON HCL 4 MG PO TABS
4.0000 mg | ORAL_TABLET | Freq: Four times a day (QID) | ORAL | Status: DC | PRN
  Administered 2013-07-20: 4 mg via ORAL
  Filled 2013-07-15: qty 1

## 2013-07-15 MED ORDER — HYDROCHLOROTHIAZIDE 12.5 MG PO CAPS
12.5000 mg | ORAL_CAPSULE | Freq: Every day | ORAL | Status: DC
  Administered 2013-07-15 – 2013-07-21 (×5): 12.5 mg via ORAL
  Filled 2013-07-15 (×7): qty 1

## 2013-07-15 MED ORDER — BISOPROLOL FUMARATE 10 MG PO TABS
10.0000 mg | ORAL_TABLET | Freq: Every day | ORAL | Status: DC
  Administered 2013-07-17 – 2013-07-19 (×3): 10 mg via ORAL
  Filled 2013-07-15 (×7): qty 1

## 2013-07-15 SURGICAL SUPPLY — 74 items
BANDAGE ELASTIC 4 VELCRO ST LF (GAUZE/BANDAGES/DRESSINGS) ×3 IMPLANT
BANDAGE ESMARK 6X9 LF (GAUZE/BANDAGES/DRESSINGS) ×1 IMPLANT
BLADE SAG 18X100X1.27 (BLADE) ×3 IMPLANT
BLADE SAW SGTL 13.0X1.19X90.0M (BLADE) ×3 IMPLANT
BNDG COHESIVE 6X5 TAN STRL LF (GAUZE/BANDAGES/DRESSINGS) ×3 IMPLANT
BNDG ELASTIC 6X10 VLCR STRL LF (GAUZE/BANDAGES/DRESSINGS) ×3 IMPLANT
BNDG ESMARK 6X9 LF (GAUZE/BANDAGES/DRESSINGS) ×3
BOWL SMART MIX CTS (DISPOSABLE) ×3 IMPLANT
CEMENT BONE SIMPLEX SPEEDSET (Cement) ×6 IMPLANT
CLOTH BEACON ORANGE TIMEOUT ST (SAFETY) ×3 IMPLANT
COVER SURGICAL LIGHT HANDLE (MISCELLANEOUS) ×3 IMPLANT
CUFF TOURNIQUET SINGLE 34IN LL (TOURNIQUET CUFF) IMPLANT
CUFF TOURNIQUET SINGLE 44IN (TOURNIQUET CUFF) IMPLANT
DRAPE INCISE IOBAN 66X45 STRL (DRAPES) IMPLANT
DRAPE ORTHO SPLIT 77X108 STRL (DRAPES) ×6
DRAPE SURG ORHT 6 SPLT 77X108 (DRAPES) ×3 IMPLANT
DRAPE U-SHAPE 47X51 STRL (DRAPES) ×3 IMPLANT
DRSG PAD ABDOMINAL 8X10 ST (GAUZE/BANDAGES/DRESSINGS) ×6 IMPLANT
DURAPREP 26ML APPLICATOR (WOUND CARE) ×3 IMPLANT
ELECT REM PT RETURN 9FT ADLT (ELECTROSURGICAL) ×3
ELECTRODE REM PT RTRN 9FT ADLT (ELECTROSURGICAL) ×1 IMPLANT
FACESHIELD LNG OPTICON STERILE (SAFETY) ×3 IMPLANT
GAUZE XEROFORM 5X9 LF (GAUZE/BANDAGES/DRESSINGS) ×3 IMPLANT
GLOVE BIOGEL PI IND STRL 7.5 (GLOVE) ×1 IMPLANT
GLOVE BIOGEL PI IND STRL 8 (GLOVE) ×1 IMPLANT
GLOVE BIOGEL PI INDICATOR 7.5 (GLOVE) ×2
GLOVE BIOGEL PI INDICATOR 8 (GLOVE) ×2
GLOVE ECLIPSE 7.0 STRL STRAW (GLOVE) ×6 IMPLANT
GLOVE SURG ORTHO 8.0 STRL STRW (GLOVE) ×3 IMPLANT
GOWN PREVENTION PLUS LG XLONG (DISPOSABLE) IMPLANT
GOWN PREVENTION PLUS XLARGE (GOWN DISPOSABLE) ×3 IMPLANT
GOWN STRL NON-REIN LRG LVL3 (GOWN DISPOSABLE) ×9 IMPLANT
HANDPIECE INTERPULSE COAX TIP (DISPOSABLE) ×2
HOOD PEEL AWAY FACE SHEILD DIS (HOOD) ×9 IMPLANT
IMMOBILIZER KNEE 20 (SOFTGOODS) ×3 IMPLANT
IMMOBILIZER KNEE 20 THIGH 36 (SOFTGOODS) ×1 IMPLANT
IMMOBILIZER KNEE 22 UNIV (SOFTGOODS) IMPLANT
IMMOBILIZER KNEE 24 THIGH 36 (MISCELLANEOUS) IMPLANT
IMMOBILIZER KNEE 24 UNIV (MISCELLANEOUS)
KIT BASIN OR (CUSTOM PROCEDURE TRAY) ×3 IMPLANT
KIT ROOM TURNOVER OR (KITS) ×3 IMPLANT
KNEE LEVEL 1C ×3 IMPLANT
MANIFOLD NEPTUNE II (INSTRUMENTS) ×3 IMPLANT
NEEDLE 18GX1X1/2 (RX/OR ONLY) (NEEDLE) ×3 IMPLANT
NEEDLE 21X1 OR PACK (NEEDLE) ×3 IMPLANT
NEEDLE SPNL 18GX3.5 QUINCKE PK (NEEDLE) ×3 IMPLANT
NS IRRIG 1000ML POUR BTL (IV SOLUTION) ×6 IMPLANT
PACK TOTAL JOINT (CUSTOM PROCEDURE TRAY) ×3 IMPLANT
PAD ABD 8X10 STRL (GAUZE/BANDAGES/DRESSINGS) ×6 IMPLANT
PAD ARMBOARD 7.5X6 YLW CONV (MISCELLANEOUS) ×6 IMPLANT
PAD CAST 4YDX4 CTTN HI CHSV (CAST SUPPLIES) ×1 IMPLANT
PADDING CAST COTTON 4X4 STRL (CAST SUPPLIES) ×2
PADDING CAST COTTON 6X4 STRL (CAST SUPPLIES) ×6 IMPLANT
RUBBERBAND STERILE (MISCELLANEOUS) IMPLANT
SET HNDPC FAN SPRY TIP SCT (DISPOSABLE) ×1 IMPLANT
SPONGE GAUZE 4X4 12PLY (GAUZE/BANDAGES/DRESSINGS) ×3 IMPLANT
SPONGE LAP 18X18 X RAY DECT (DISPOSABLE) IMPLANT
STAPLER VISISTAT 35W (STAPLE) ×3 IMPLANT
STEM CEMENTED TRIATHLON (Stem) ×3 IMPLANT
SUCTION FRAZIER TIP 10 FR DISP (SUCTIONS) ×3 IMPLANT
SUT ETHILON 3 0 PS 1 (SUTURE) ×6 IMPLANT
SUT VIC AB 0 CTB1 27 (SUTURE) ×9 IMPLANT
SUT VIC AB 1 CT1 27 (SUTURE) ×10
SUT VIC AB 1 CT1 27XBRD ANBCTR (SUTURE) ×5 IMPLANT
SUT VIC AB 2-0 CT1 27 (SUTURE) ×4
SUT VIC AB 2-0 CT1 TAPERPNT 27 (SUTURE) ×2 IMPLANT
SYR 30ML LL (SYRINGE) ×3 IMPLANT
SYR 30ML SLIP (SYRINGE) ×3 IMPLANT
SYR 50ML LL SCALE MARK (SYRINGE) ×2 IMPLANT
SYR TB 1ML LUER SLIP (SYRINGE) ×3 IMPLANT
TOWEL OR 17X24 6PK STRL BLUE (TOWEL DISPOSABLE) ×3 IMPLANT
TOWEL OR 17X26 10 PK STRL BLUE (TOWEL DISPOSABLE) ×6 IMPLANT
TRAY FOLEY CATH 16FRSI W/METER (SET/KITS/TRAYS/PACK) ×3 IMPLANT
WATER STERILE IRR 1000ML POUR (IV SOLUTION) ×6 IMPLANT

## 2013-07-15 NOTE — Anesthesia Postprocedure Evaluation (Signed)
  Anesthesia Post-op Note  Patient: Leslie Ball  Procedure(s) Performed: Procedure(s): TOTAL KNEE ARTHROPLASTY- left (Left)  Patient Location: PACU  Anesthesia Type:GA  Level of Consciousness: awake, alert , oriented and patient cooperative  Airway and Oxygen Therapy: Patient Spontanous Breathing  Post-op Pain: mild  Post-op Assessment: Post-op Vital signs reviewed, Patient's Cardiovascular Status Stable, Respiratory Function Stable, Patent Airway, No signs of Nausea or vomiting and Pain level controlled  Post-op Vital Signs: stable  Complications: No apparent anesthesia complications

## 2013-07-15 NOTE — Interval H&P Note (Signed)
History and Physical Interval Note:  07/15/2013 7:30 AM  Leslie Ball  has presented today for surgery, with the diagnosis of LEFT KNEE OSTEOARTHRITIS  The various methods of treatment have been discussed with the patient and family. After consideration of risks, benefits and other options for treatment, the patient has consented to  Procedure(s): TOTAL KNEE ARTHROPLASTY (Left) as a surgical intervention .  The patient's history has been reviewed, patient examined, no change in status, stable for surgery.  I have reviewed the patient's chart and labs.  Questions were answered to the patient's satisfaction.     DEAN,GREGORY SCOTT

## 2013-07-15 NOTE — Progress Notes (Signed)
ANTICOAGULATION CONSULT NOTE - Initial Consult  Pharmacy Consult for coumadin Indication: VTE prophylaxis  Allergies  Allergen Reactions  . Chlorhexidine Itching    REACTION: redness    Patient Measurements:   Dosing Weight: 83.6 kg  Vital Signs: Temp: 97.6 F (36.4 C) (02/24 1622) Temp src: Oral (02/24 0918) BP: 125/59 mmHg (02/24 1622) Pulse Rate: 50 (02/24 1622)  Labs: No results found for this basename: HGB, HCT, PLT, APTT, LABPROT, INR, HEPARINUNFRC, CREATININE, CKTOTAL, CKMB, TROPONINI,  in the last 72 hours  The CrCl is unknown because both a height and weight (above a minimum accepted value) are required for this calculation.   Medical History: Past Medical History  Diagnosis Date  . Hypertension   . Asthma   . Constipation     occasionally  . Neuromuscular disorder     right hand has "pins and needles" feeling at times  . Arthritis   . 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 GERD (gastroesophageal reflux disease)     rare use of Pepto bismol  . Anemia     Medications:  Prescriptions prior to admission  Medication Sig Dispense Refill  . bisoprolol (ZEBETA) 10 MG tablet Take 10 mg by mouth at bedtime.      . brinzolamide (AZOPT) 1 % ophthalmic suspension Place 1 drop into the left eye 3 (three) times daily.      . Fluticasone-Salmeterol (ADVAIR) 250-50 MCG/DOSE AEPB Inhale 1 puff into the lungs daily as needed (for shortness of breath).       . hydrochlorothiazide (MICROZIDE) 12.5 MG capsule Take 12.5 mg by mouth daily after supper.       . theophylline (THEODUR) 300 MG 12 hr tablet Take 150 mg by mouth at bedtime.      . traMADol (ULTRAM) 50 MG tablet Take 50 mg by mouth 3 (three) times daily as needed for moderate pain.      . Travoprost, BAK Free, (TRAVATAN) 0.004 % SOLN ophthalmic solution Place 1 drop into the left eye at bedtime.        Assessment: 73 yo lady to start  comadin for VTE px.  Her baseline Hg 10.1, PTLC 396, INR 1.22 Goal of Therapy:  INR 2-3 Monitor platelets by anticoagulation protocol: Yes   Plan:  Coumadin 5 mg po today Daily PT/INR Coumadin education materials   Talbert Cage Poteet 07/15/2013,5:25 PM

## 2013-07-15 NOTE — Anesthesia Procedure Notes (Signed)
Procedure Name: Intubation Date/Time: 07/15/2013 11:24 AM Performed by: Jerilee Hoh Pre-anesthesia Checklist: Patient identified, Emergency Drugs available, Suction available and Patient being monitored Patient Re-evaluated:Patient Re-evaluated prior to inductionOxygen Delivery Method: Circle system utilized Preoxygenation: Pre-oxygenation with 100% oxygen Intubation Type: IV induction Ventilation: Mask ventilation without difficulty Laryngoscope Size: Mac and 3 Grade View: Grade I Tube type: Oral Tube size: 7.0 mm Number of attempts: 1 Airway Equipment and Method: Stylet Placement Confirmation: ETT inserted through vocal cords under direct vision,  positive ETCO2 and breath sounds checked- equal and bilateral Secured at: 22 cm Tube secured with: Tape Dental Injury: Teeth and Oropharynx as per pre-operative assessment

## 2013-07-15 NOTE — Progress Notes (Signed)
Orthopedic Tech Progress Note Patient Details:  Leslie Ball May 09, 1941 [id number] CPM applied with appropriate settings. OHF applied to bed.  CPM Left Knee CPM Left Knee: On Left Knee Flexion (Degrees): 45 Left Knee Extension (Degrees): 0   Asia R Thompson 07/15/2013, 3:07 PM

## 2013-07-15 NOTE — Transfer of Care (Signed)
Immediate Anesthesia Transfer of Care Note  Patient: Leslie Ball  Procedure(s) Performed: Procedure(s): TOTAL KNEE ARTHROPLASTY- left (Left)  Patient Location: PACU  Anesthesia Type:General  Level of Consciousness: awake, alert , oriented and patient cooperative  Airway & Oxygen Therapy: Patient Spontanous Breathing and Patient connected to nasal cannula oxygen  Post-op Assessment: Report given to PACU RN, Post -op Vital signs reviewed and stable and Patient moving all extremities  Post vital signs: Reviewed and stable  Complications: No apparent anesthesia complications

## 2013-07-15 NOTE — Preoperative (Signed)
Beta Blockers   Reason not to administer Beta Blockers:Took bisoprolol 2/23 at bedtime

## 2013-07-15 NOTE — Brief Op Note (Signed)
07/15/2013  2:23 PM  PATIENT:  Leslie Ball  73 y.o. female  PRE-OPERATIVE DIAGNOSIS:  LEFT KNEE OSTEOARTHRITIS  POST-OPERATIVE DIAGNOSIS:  LEFT KNEE OSTEOARTHRITIS  PROCEDURE:  Procedure(s): TOTAL KNEE ARTHROPLASTY- left  SURGEON:  Surgeon(s): Cammy Copa, MD  ASSISTANT: carla bethune - rnfa  ANESTHESIA:   general  EBL: 75 ml    Total I/O In: 2050 [I.V.:2050] Out: 425 [Urine:375; Blood:50]  BLOOD ADMINISTERED: none  DRAINS: none   LOCAL MEDICATIONS USED:  exparel - morphine clonidine - marcaine  SPECIMEN:  No Specimen  COUNTS:  YES  TOURNIQUET:   Total Tourniquet Time Documented: Thigh (Left) - 25 minutes Total: Thigh (Left) - 25 minutes   DICTATION: .Other Dictation: Dictation Number [id number]  PLAN OF CARE: Admit to inpatient   PATIENT DISPOSITION:  PACU - hemodynamically stable

## 2013-07-15 NOTE — Op Note (Signed)
NAME:  Leslie Ball, Leslie Ball NO.:  [id number]  MEDICAL RECORD NO.:  [id number]  LOCATION:  [id number]                        FACILITY:  [facility]  PHYSICIAN:  Burnard Bunting, M.D.    DATE OF BIRTH:  Aug 08, 1940  DATE OF PROCEDURE: DATE OF DISCHARGE:                              OPERATIVE REPORT   PREOPERATIVE DIAGNOSIS:  Left knee arthritis.  POSTOPERATIVE DIAGNOSIS:  Left knee arthritis.  PROCEDURE:  Left total knee replacement.  SURGEON:  Burnard Bunting, M.D.  ASSISTANT:  Patrick Jupiter, RNFA.  INDICATIONS:  Leslie Ball is a patient with left knee arthritis, presents for operative management after explanation of risks and benefits.  She has had pretty significant left knee pain.  She had good result with the right total knee replacement, presents now for left total knee replacement.  COMPONENTS UTILIZED:  [employer] Triathlon component, 2 femur, 3 tibia, 11 poly insert, posterior cruciate sacrificing 29-mm 3 pegged patella.  TOURNIQUET TIME:  85 minutes at 300 mmHg.  PROCEDURE IN DETAIL:  The patient was brought to the operating room, where general endotracheal anesthesia was induced.  Preoperative antibiotics were administered.  Time-out was called.  Left leg was prescrubbed with alcohol and Betadine, which was allowed to air dry, prepped with DuraPrep solution and draped in a sterile manner.  Leg was covered with Ioban.  Leg was elevated and exsanguinated with Esmarch wrap.  Tourniquet was inflated.  Anterior approach to the knee was made. Skin and subcutaneous tissues were sharply divided.  Median parapatellar approach was made.  Patella was everted.  Significant synovitis was present.  Fluid appeared to be more related to synovitis and inflammation as apposed to infection; however, some was sent for culture.  Synovectomy was performed as was on the right knee.  Fat pad was partially excised.  Soft tissue elevated off the distal anterior femur because of the patient's  significant laxity preop, minimal bone cuts were made and minimal soft tissue dissection was performed medially.  A cut 2-mm off the most affected medial side was made. Collaterals and posterior neurovascular structures were protected. Distal femoral cut was then made to size 8 mm.  This allowed full extension.  Box cut and chamfer cuts were made.  Tibia was keel punched to accept a 50-mm stem.  Patella was then everted and it was cut from the 24 down to 14.  Three pegged trial was placed.  Trial component on the femur and tibia was also placed, size 2 on the femur and size 3 on the tibia.  The patient had with 9-mm spacer, slight hyperextension with good stability in varus and valgus stress, this was improved.  We found the 11-mm spacer, she has full extension and very good stability in varus and valgus stress at 0, 30 and 90 degrees.  The patella tracked well.  No thumbs technique.  Trial components were removed, 3 liters of irrigating solution applied.  Exparel was then injected into the capsule.  Thrombin was also used because of the synovectomy and the patient's somewhat low hemoglobin to start with.  Components were then cemented into position.  Excess cement was removed.  Same instability parameters were maintained with the 11-mm  spacer.  Tourniquet was released.  Bleeding points were encountered and controlled with electrocautery.  Thorough irrigation was again performed using pouring irrigation.  The incision was then closed over bolster using #1 Vicryl suture, 0 Vicryl suture, 2-0 Vicryl suture and skin staples.  Solution of Marcaine, morphine, clonidine were injected into the knee.  Skin edges were anesthetized with Marcaine as well.  Bulky wrap was applied. The patient tolerated the procedure well without immediate complication. Total tourniquet time approximately 85 minutes.     Burnard Bunting, M.D.     GSD/MEDQ  D:  07/15/2013  T:  07/15/2013  Job:  [id number]

## 2013-07-15 NOTE — Anesthesia Preprocedure Evaluation (Signed)
Anesthesia Evaluation  Patient identified by MRN, date of birth, ID band Patient awake    Reviewed: Allergy & Precautions, H&P , NPO status , Patient's Chart, lab work & pertinent test results, reviewed documented beta blocker date and time   Airway Mallampati: II TM Distance: >3 FB Neck ROM: full    Dental   Pulmonary asthma ,  breath sounds clear to auscultation        Cardiovascular hypertension, On Medications and On Home Beta Blockers Rhythm:regular     Neuro/Psych  Neuromuscular disease negative psych ROS   GI/Hepatic Neg liver ROS, GERD-  Medicated and Controlled,  Endo/Other  negative endocrine ROS  Renal/GU negative Renal ROS  negative genitourinary   Musculoskeletal   Abdominal   Peds  Hematology  (+) anemia ,   Anesthesia Other Findings See surgeon's H&P   Reproductive/Obstetrics negative OB ROS                           Anesthesia Physical Anesthesia Plan  ASA: III  Anesthesia Plan: General   Post-op Pain Management:    Induction: Intravenous  Airway Management Planned: Oral ETT  Additional Equipment:   Intra-op Plan:   Post-operative Plan: Extubation in OR  Informed Consent: I have reviewed the patients History and Physical, chart, labs and discussed the procedure including the risks, benefits and alternatives for the proposed anesthesia with the patient or authorized representative who has indicated his/her understanding and acceptance.   Dental Advisory Given  Plan Discussed with: CRNA and Surgeon  Anesthesia Plan Comments:         Anesthesia Quick Evaluation

## 2013-07-16 ENCOUNTER — Encounter (HOSPITAL_COMMUNITY): Payer: Self-pay | Admitting: Orthopedic Surgery

## 2013-07-16 LAB — BASIC METABOLIC PANEL WITH GFR
BUN: 16 mg/dL (ref 6–23)
CO2: 25 meq/L (ref 19–32)
Calcium: 8.5 mg/dL (ref 8.4–10.5)
Chloride: 102 meq/L (ref 96–112)
Creatinine, Ser: 0.76 mg/dL (ref 0.50–1.10)
GFR calc Af Amer: >90 mL/min
GFR calc non Af Amer: 82 mL/min — ABNORMAL LOW
Glucose, Bld: 141 mg/dL — ABNORMAL HIGH (ref 70–99)
Potassium: 4.3 meq/L (ref 3.7–5.3)
Sodium: 140 meq/L (ref 137–147)

## 2013-07-16 LAB — CBC
HCT: 27.6 % — ABNORMAL LOW (ref 36.0–46.0)
Hemoglobin: 8.5 g/dL — ABNORMAL LOW (ref 12.0–15.0)
MCH: 23.9 pg — ABNORMAL LOW (ref 26.0–34.0)
MCHC: 30.8 g/dL (ref 30.0–36.0)
MCV: 77.7 fL — AB (ref 78.0–100.0)
PLATELETS: 337 10*3/uL (ref 150–400)
RBC: 3.55 MIL/uL — AB (ref 3.87–5.11)
RDW: 17 % — ABNORMAL HIGH (ref 11.5–15.5)
WBC: 6.2 10*3/uL (ref 4.0–10.5)

## 2013-07-16 LAB — PROTIME-INR
INR: 1.47 (ref 0.00–1.49)
PROTHROMBIN TIME: 17.4 s — AB (ref 11.6–15.2)

## 2013-07-16 MED ORDER — WARFARIN SODIUM 2.5 MG PO TABS
2.5000 mg | ORAL_TABLET | Freq: Once | ORAL | Status: AC
  Administered 2013-07-16: 2.5 mg via ORAL
  Filled 2013-07-16: qty 1

## 2013-07-16 MED ORDER — CETIRIZINE HCL 10 MG PO TABS
10.0000 mg | ORAL_TABLET | Freq: Every day | ORAL | Status: DC
  Administered 2013-07-16 – 2013-07-19 (×4): 10 mg via ORAL
  Filled 2013-07-16 (×6): qty 1

## 2013-07-16 MED ORDER — NON FORMULARY: 10.0000 mg | Freq: Every day | Status: DC

## 2013-07-16 MED ORDER — TRAMADOL HCL 50 MG PO TABS: 50.0000 mg | ORAL_TABLET | Freq: Three times a day (TID) | ORAL | Status: DC | PRN

## 2013-07-16 NOTE — Progress Notes (Signed)
Chaplain observed pt sitting in chair.  Chaplain offered emotional support to pt and her son by engaging in conversation on a variety of topics, listening reflectively and by establishing a day and time for chaplain to return and serve [employer].   07/16/13 1200  Clinical Encounter Type  Visited With Patient and family together  Visit Type Spiritual support  Referral From Nurse    Rulon Abide, chaplain pager [phone]

## 2013-07-16 NOTE — Plan of Care (Signed)
Problem: Consults Goal: Diagnosis- Total Joint Replacement Primary Total Knee Left     

## 2013-07-16 NOTE — Progress Notes (Signed)
ANTICOAGULATION CONSULT NOTE - F/U Consult  Pharmacy Consult for coumadin Indication: VTE prophylaxis  Allergies  Allergen Reactions  . Chlorhexidine Itching    REACTION: redness    Patient Measurements:   Dosing Weight: 83.6 kg  Vital Signs: Temp: 97.7 F (36.5 C) (02/25 0500) BP: 132/68 mmHg (02/25 0500) Pulse Rate: 64 (02/25 0500)  Labs:  Recent Labs  07/16/13 0450  HGB 8.5*  HCT 27.6*  PLT 337  LABPROT 17.4*  INR 1.47  CREATININE 0.76    The CrCl is unknown because both a height and weight (above a minimum accepted value) are required for this calculation.   Medical History: Past Medical History  Diagnosis Date  . Hypertension   . Asthma   . Constipation     occasionally  . Neuromuscular disorder     right hand has "pins and needles" feeling at times  . 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 GERD (gastroesophageal reflux disease)     rare use of Pepto bismol  . Anemia   . Arthritis     "left knee; probably left shoulder & elbow" (07/15/2013)    Medications:  Prescriptions prior to admission  Medication Sig Dispense Refill  . bisoprolol (ZEBETA) 10 MG tablet Take 10 mg by mouth at bedtime.      . brinzolamide (AZOPT) 1 % ophthalmic suspension Place 1 drop into the left eye 3 (three) times daily.      . Fluticasone-Salmeterol (ADVAIR) 250-50 MCG/DOSE AEPB Inhale 1 puff into the lungs daily as needed (for shortness of breath).       . hydrochlorothiazide (MICROZIDE) 12.5 MG capsule Take 12.5 mg by mouth daily after supper.       . theophylline (THEODUR) 300 MG 12 hr tablet Take 150 mg by mouth at bedtime.      . traMADol (ULTRAM) 50 MG tablet Take 50 mg by mouth 3 (three) times daily as needed for moderate pain.      . Travoprost, BAK Free, (TRAVATAN) 0.004 % SOLN ophthalmic solution Place 1 drop into the left eye at bedtime.        Assessment: L TKA  Anticoag: VTE prophx.  S/p L TKA. Her baseline Hg 10.1, PTLC 396, INR 1.22 (Noted: She was on coumadin previous admit with large increase in INR after 5mg  dose X 3). INR with large increase noted today 1.22>>1.47.   Goal of Therapy:  INR 2-3 Monitor platelets by anticoagulation protocol: Yes   Plan:  Decrease Coumadin to 2.5mg  po x 1 today.  Shawntez Dickison S. , PharmD, BCPS Clinical Staff Pharmacist Pager [phone]  [phone] [place] 07/16/2013,10:56 AM

## 2013-07-16 NOTE — Evaluation (Signed)
Physical Therapy Evaluation Patient Details Name: Leslie Ball MRN: [id number] DOB: Apr 04, 1941 Today's Date: 07/16/2013 Time: 0910-0926 PT Time Calculation (min): 16 min  PT Assessment / Plan / Recommendation History of Present Illness  pt s/p L TKA  Clinical Impression  Pt very labile during session, delayed processing noted, ? Language barrier vs confusion.  Pt requires +2 assist for mobility and transfers, will benefit from skilled PT services to address deficits and decrease burden of care for transfer to SNF at d/c.    PT Assessment  Patient needs continued PT services    Follow Up Recommendations  SNF    Does the patient have the potential to tolerate intense rehabilitation      Barriers to Discharge Decreased caregiver support;Inaccessible home environment lives alone per chart    Equipment Recommendations  None recommended by PT    Recommendations for Other Services     Frequency 7X/week    Precautions / Restrictions Precautions Required Braces or Orthoses: Knee Immobilizer - Left Knee Immobilizer - Left: On at all times Restrictions Weight Bearing Restrictions: Yes LLE Weight Bearing: Weight bearing as tolerated   Pertinent Vitals/Pain Pt crying out in pain, RN made aware, repositioned and rested as needed      Mobility  Bed Mobility Overal bed mobility: Needs Assistance Bed Mobility: Supine to Sit Supine to sit: Max assist General bed mobility comments: assist for LEs and trunk Transfers Overall transfer level: Needs assistance Equipment used: Rolling walker (2 wheeled) Transfers: Sit to/from [employer] Transfers Sit to Stand: +2 safety/equipment Stand pivot transfers: +2 safety/equipment General transfer comment: max A for lifting, +2 for safety and due to pt fear of falling and pain, assist to manage L LE with sliding back in chair, +2 assist to scoot hips back in chair    Exercises     PT Diagnosis: Difficulty walking;Generalized  weakness;Acute pain  PT Problem List: Decreased strength;Decreased mobility;Decreased safety awareness;Decreased range of motion;Decreased activity tolerance;Decreased balance;Pain;Decreased knowledge of use of DME;Decreased knowledge of precautions PT Treatment Interventions: DME instruction;Wheelchair mobility training;Therapeutic exercise;Gait training;Balance training;Stair training;Neuromuscular re-education;Modalities;Functional mobility training;Cognitive remediation;Patient/family education;Therapeutic activities     PT Goals(Current goals can be found in the care plan section) Acute Rehab PT Goals Patient Stated Goal: none stated PT Goal Formulation: Patient unable to participate in goal setting Time For Goal Achievement: 07/23/13 Potential to Achieve Goals: Good  Visit Information  Last PT Received On: 07/16/13 Assistance Needed: +2 History of Present Illness: pt s/p L TKA       Prior Functioning  Home Living Family/patient expects to be discharged to:: Private residence Living Arrangements: Alone Type of Home: House Home Access: Level entry Home Layout: One level Additional Comments: pt unable to provide history due to confusion, lability Prior Function Comments: pt unable to provide Communication Communication: Prefers language other than English    Cognition  Cognition Arousal/Alertness: Awake/alert Behavior During Therapy:  (labile, frequent crying) Overall Cognitive Status: No family/caregiver present to determine baseline cognitive functioning (delayed processing, ? language barrier vs cognition)    Extremity/Trunk Assessment Upper Extremity Assessment Upper Extremity Assessment: Generalized weakness Lower Extremity Assessment Lower Extremity Assessment: LLE deficits/detail LLE Deficits / Details: ROM limited by pain Cervical / Trunk Assessment Cervical / Trunk Assessment: Normal   Balance    End of Session PT - End of Session Equipment Utilized During  Treatment: Gait belt;Left knee immobilizer Activity Tolerance: Patient limited by pain Patient left: in chair;with call bell/phone within reach Nurse Communication: Mobility status;Patient requests pain  meds CPM Left Knee CPM Left Knee: Off Left Knee Flexion (Degrees): 45 Left Knee Extension (Degrees): 0  GP     Nakiesha Rumsey 07/16/2013, 10:57 AM

## 2013-07-16 NOTE — Progress Notes (Signed)
Subjective: Pt stable - pain controlled - itching some   Objective: Vital signs in last 24 hours: Temp:  [97.6 F (36.4 C)-98.7 F (37.1 C)] 97.7 F (36.5 C) (02/25 0500) Pulse Rate:  [50-65] 64 (02/25 0500) Resp:  [10-20] 18 (02/25 0500) BP: (114-141)/(36-76) 132/68 mmHg (02/25 0500) SpO2:  [93 %-100 %] 98 % (02/25 0500)  Intake/Output from previous day: 02/24 0701 - 02/25 0700 In: 3348.8 [P.O.:480; I.V.:2868.8] Out: 850 [Urine:800; Blood:50] Intake/Output this shift:    Exam:  Neurovascular intact Sensation intact distally Intact pulses distally  Labs:  Recent Labs  07/16/13 0450  HGB 8.5*    Recent Labs  07/16/13 0450  WBC 6.2  RBC 3.55*  HCT 27.6*  PLT 337    Recent Labs  07/16/13 0450  NA 140  K 4.3  CL 102  CO2 25  BUN 16  CREATININE 0.76  GLUCOSE 141*  CALCIUM 8.5    Recent Labs  07/16/13 0450  INR 1.47    Assessment/Plan: Pt doing well - minimal drop in hgb - PT and CPM today - anticipate slow recovery with dc next week to home   Leslie Ball 07/16/2013, 7:24 AM

## 2013-07-17 LAB — CBC
HCT: 26.3 % — ABNORMAL LOW (ref 36.0–46.0)
HEMOGLOBIN: 8.1 g/dL — AB (ref 12.0–15.0)
MCH: 24 pg — ABNORMAL LOW (ref 26.0–34.0)
MCHC: 30.8 g/dL (ref 30.0–36.0)
MCV: 77.8 fL — ABNORMAL LOW (ref 78.0–100.0)
Platelets: 253 10*3/uL (ref 150–400)
RBC: 3.38 MIL/uL — AB (ref 3.87–5.11)
RDW: 17.1 % — ABNORMAL HIGH (ref 11.5–15.5)
WBC: 5.9 10*3/uL (ref 4.0–10.5)

## 2013-07-17 LAB — PROTIME-INR
INR: 1.85 — AB (ref 0.00–1.49)
PROTHROMBIN TIME: 20.8 s — AB (ref 11.6–15.2)

## 2013-07-17 MED ORDER — WARFARIN SODIUM 2.5 MG PO TABS
2.5000 mg | ORAL_TABLET | Freq: Once | ORAL | Status: AC
  Administered 2013-07-17: 2.5 mg via ORAL
  Filled 2013-07-17: qty 1

## 2013-07-17 NOTE — Progress Notes (Signed)
Subjective: Pt stable - making slow progress   Objective: Vital signs in last 24 hours: Temp:  [97.6 F (36.4 C)-98.6 F (37 C)] 98.2 F (36.8 C) (02/26 1300) Pulse Rate:  [66-80] 80 (02/26 1300) Resp:  [18] 18 (02/26 1300) BP: (95-126)/(46-58) 109/54 mmHg (02/26 1300) SpO2:  [93 %-96 %] 93 % (02/26 1300)  Intake/Output from previous day: 02/25 0701 - 02/26 0700 In: 1080 [P.O.:840; I.V.:240] Out: -  Intake/Output this shift: Total I/O In: 240 [P.O.:240] Out: -   Exam:  Neurovascular intact Sensation intact distally Intact pulses distally Dorsiflexion/Plantar flexion intact  Labs:  Recent Labs  07/16/13 0450 07/17/13 0512  HGB 8.5* 8.1*    Recent Labs  07/16/13 0450 07/17/13 0512  WBC 6.2 5.9  RBC 3.55* 3.38*  HCT 27.6* 26.3*  PLT 337 253    Recent Labs  07/16/13 0450  NA 140  K 4.3  CL 102  CO2 25  BUN 16  CREATININE 0.76  GLUCOSE 141*  CALCIUM 8.5    Recent Labs  07/16/13 0450 07/17/13 0512  INR 1.47 1.85*    Assessment/Plan: Plan more PT and CPM likely dc this coming Monday - hgb looks good - hct drop 30 to 26 current   Leslie Ball 07/17/2013, 5:21 PM

## 2013-07-17 NOTE — Progress Notes (Signed)
Physical Therapy Treatment Patient Details Name: Leslie Ball MRN: [id number] DOB: August 23, 1940 Today's Date: 07/17/2013 Time: 2774-1287 PT Time Calculation (min): 24 min  PT Assessment / Plan / Recommendation  History of Present Illness pt s/p L TKA   PT Comments   Spoke with pt's son who reports pt will be discharging home and himself and wife will be with pt to provide 24/7 assist.  Pt will need to progress transfers and ambulation to a supervision level for safe d/c home. Pt is making slow progress towards goals however remains greatly limited by pain. Acute PT to con't to work with pt to further progress mobility.   Follow Up Recommendations  Home health PT;Supervision/Assistance - 24 hour     Does the patient have the potential to tolerate intense rehabilitation     Barriers to Discharge        Equipment Recommendations  Rolling walker with 5" wheels    Recommendations for Other Services    Frequency 7X/week   Progress towards PT Goals Progress towards PT goals: Progressing toward goals  Plan Discharge plan needs to be updated    Precautions / Restrictions Precautions Precautions: Fall;Knee Restrictions Weight Bearing Restrictions: Yes LLE Weight Bearing: Weight bearing as tolerated   Pertinent Vitals/Pain L knee pain but couldn't rate    Mobility  Bed Mobility Overal bed mobility: Needs Assistance Bed Mobility: Supine to Sit Supine to sit: Max assist General bed mobility comments: assist for LEs and trunk Transfers Overall transfer level: Needs assistance Equipment used: Rolling walker (2 wheeled) Transfers: Sit to/from Stand Sit to Stand: Mod assist;+2 physical assistance General transfer comment: max directional v/c's Ambulation/Gait Ambulation/Gait assistance: +2 safety/equipment;Mod assist Ambulation Distance (Feet): 8 Feet Assistive device: Rolling walker (2 wheeled) Gait Pattern/deviations: Step-to pattern;Decreased stance time - left;Decreased  stride length;Decreased step length - right General Gait Details: pt with increased pain greatly limiting ambulation tolerance. pt did not demo any L knee buckling but required maximal encourgement to achieve 8 feet of ambulation    Exercises Total Joint Exercises Quad Sets: AROM;Left;10 reps Heel Slides: AAROM;Left;10 reps Goniometric ROM: 25 deg L knee flex   PT Diagnosis:    PT Problem List:   PT Treatment Interventions:     PT Goals (current goals can now be found in the care plan section)    Visit Information  Last PT Received On: 07/17/13 Assistance Needed: +2 History of Present Illness: pt s/p L TKA    Subjective Data      Cognition  Cognition Arousal/Alertness: Awake/alert Behavior During Therapy: Anxious (re: pain) Overall Cognitive Status: Within Functional Limits for tasks assessed    Balance     End of Session PT - End of Session Equipment Utilized During Treatment: Gait belt Activity Tolerance: Patient limited by pain Patient left: in chair;with call bell/phone within reach Nurse Communication: Mobility status CPM Left Knee Left Knee Flexion (Degrees): 35 Left Knee Extension (Degrees): 0   GP     Leslie Ball 07/17/2013, 4:44 PM   Lewis Shock, PT, DPT Pager #: [phone] Office #: [phone]

## 2013-07-17 NOTE — Discharge Instructions (Signed)
Information on my medicine - Coumadin   (Warfarin)  This medication education was reviewed with me or my healthcare representative as part of my discharge preparation.  The pharmacist that spoke with me during my hospital stay was:  Pasty Spillers, [facility]  Why was Coumadin prescribed for you? Coumadin was prescribed for you because you have a blood clot or a medical condition that can cause an increased risk of forming blood clots. Blood clots can cause serious health problems by blocking the flow of blood to the heart, lung, or brain. Coumadin can prevent harmful blood clots from forming. As a reminder your indication for Coumadin is:   Blood Clot Prevention After Orthopedic Surgery  What test will check on my response to Coumadin? While on Coumadin (warfarin) you will need to have an INR test regularly to ensure that your dose is keeping you in the desired range. The INR (international normalized ratio) number is calculated from the result of the laboratory test called prothrombin time (PT).  If an INR APPOINTMENT HAS NOT ALREADY BEEN MADE FOR YOU please schedule an appointment to have this lab work done by your health care provider within 7 days. Your INR goal is usually a number between:  2 to 3 or your provider may give you a more narrow range like 2-2.5.  Ask your health care provider during an office visit what your goal INR is.  What  do you need to  know  About  COUMADIN? Take Coumadin (warfarin) exactly as prescribed by your healthcare provider about the same time each day.  DO NOT stop taking without talking to the doctor who prescribed the medication.  Stopping without other blood clot prevention medication to take the place of Coumadin may increase your risk of developing a new clot or stroke.  Get refills before you run out.  What do you do if you miss a dose? If you miss a dose, take it as soon as you remember on the same day then continue your regularly scheduled  regimen the next day.  Do not take two doses of Coumadin at the same time.  Important Safety Information A possible side effect of Coumadin (Warfarin) is an increased risk of bleeding. You should call your healthcare provider right away if you experience any of the following:   Bleeding from an injury or your nose that does not stop.   Unusual colored urine (red or dark brown) or unusual colored stools (red or black).   Unusual bruising for unknown reasons.   A serious fall or if you hit your head (even if there is no bleeding).  Some foods or medicines interact with Coumadin (warfarin) and might alter your response to warfarin. To help avoid this:   Eat a balanced diet, maintaining a consistent amount of Vitamin K.   Notify your provider about major diet changes you plan to make.   Avoid alcohol or limit your intake to 1 drink for women and 2 drinks for men per day. (1 drink is 5 oz. wine, 12 oz. beer, or 1.5 oz. liquor.)  Make sure that ANY health care provider who prescribes medication for you knows that you are taking Coumadin (warfarin).  Also make sure the healthcare provider who is monitoring your Coumadin knows when you have started a new medication including herbals and non-prescription products.  Coumadin (Warfarin)  Major Drug Interactions  Increased Warfarin Effect Decreased Warfarin Effect  Alcohol (large quantities) Antibiotics (esp. Septra/Bactrim, Flagyl, Cipro) Amiodarone (Cordarone) Aspirin (  ASA) °Cimetidine (Tagamet) °Megestrol (Megace) °NSAIDs (ibuprofen, naproxen, etc.) °Piroxicam (Feldene) °Propafenone (Rythmol SR) °Propranolol (Inderal) °Isoniazid (INH) °Posaconazole (Noxafil) Barbiturates (Phenobarbital) °Carbamazepine (Tegretol) °Chlordiazepoxide (Librium) °Cholestyramine (Questran) °Griseofulvin °Oral Contraceptives °Rifampin °Sucralfate (Carafate) °Vitamin K  ° °Coumadin® (Warfarin) Major Herbal Interactions  °Increased Warfarin Effect Decreased Warfarin Effect    °Garlic °Ginseng °Ginkgo biloba Coenzyme Q10 °Green tea °St. John’s wort   ° °Coumadin® (Warfarin) FOOD Interactions  °Eat a consistent number of servings per week of foods HIGH in Vitamin K °(1 serving = ½ cup)  °Collards (cooked, or boiled & drained) °Kale (cooked, or boiled & drained) °Mustard greens (cooked, or boiled & drained) °Parsley *serving size only = ¼ cup °Spinach (cooked, or boiled & drained) °Swiss chard (cooked, or boiled & drained) °Turnip greens (cooked, or boiled & drained)  °Eat a consistent number of servings per week of foods MEDIUM-HIGH in Vitamin K °(1 serving = 1 cup)  °Asparagus (cooked, or boiled & drained) °Broccoli (cooked, boiled & drained, or raw & chopped) °Brussel sprouts (cooked, or boiled & drained) *serving size only = ½ cup °Lettuce, raw (green leaf, endive, romaine) °Spinach, raw °Turnip greens, raw & chopped  ° °These websites have more information on Coumadin (warfarin):  www.coumadin.com; °www.ahrq.gov/consumer/coumadin.htm; ° ° ° °

## 2013-07-17 NOTE — Progress Notes (Signed)
ANTICOAGULATION CONSULT NOTE - F/U Consult  Pharmacy Consult for coumadin Indication: VTE prophylaxis  Allergies  Allergen Reactions  . Chlorhexidine Itching    REACTION: redness    Patient Measurements:   Dosing Weight: 83.6 kg  Vital Signs: Temp: 97.6 F (36.4 C) (02/26 0545) Temp src: Oral (02/26 0545) BP: 126/58 mmHg (02/26 0545) Pulse Rate: 79 (02/26 0545)  Labs:  Recent Labs  07/16/13 0450 07/17/13 0512  HGB 8.5* 8.1*  HCT 27.6* 26.3*  PLT 337 253  LABPROT 17.4* 20.8*  INR 1.47 1.85*  CREATININE 0.76  --     The CrCl is unknown because both a height and weight (above a minimum accepted value) are required for this calculation.   Medical History: Past Medical History  Diagnosis Date  . Hypertension   . Asthma   . Constipation     occasionally  . Neuromuscular disorder     right hand has "pins and needles" feeling at times  . 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 GERD (gastroesophageal reflux disease)     rare use of Pepto bismol  . Anemia   . Arthritis     "left knee; probably left shoulder & elbow" (07/15/2013)    Medications:  Prescriptions prior to admission  Medication Sig Dispense Refill  . bisoprolol (ZEBETA) 10 MG tablet Take 10 mg by mouth at bedtime.      . brinzolamide (AZOPT) 1 % ophthalmic suspension Place 1 drop into the left eye 3 (three) times daily.      . Fluticasone-Salmeterol (ADVAIR) 250-50 MCG/DOSE AEPB Inhale 1 puff into the lungs daily as needed (for shortness of breath).       . hydrochlorothiazide (MICROZIDE) 12.5 MG capsule Take 12.5 mg by mouth daily after supper.       . theophylline (THEODUR) 300 MG 12 hr tablet Take 150 mg by mouth at bedtime.      . traMADol (ULTRAM) 50 MG tablet Take 50 mg by mouth 3 (three) times daily as needed for moderate pain.      . Travoprost, BAK Free, (TRAVATAN) 0.004 % SOLN ophthalmic solution Place 1 drop into  the left eye at bedtime.        Assessment: L TKA  Anticoag: VTE prophx. S/p L TKA. Her baseline Hg 10.1, PTLC 396, INR 1.22 (Noted: She was on coumadin previous admit with large increase in INR after 5mg  dose X 3). INR with large increase noted today 1.22>>1.47>>1.85. Post op Hgb 8.1 down slightly more.  Goal of Therapy:  INR 2-3 Monitor platelets by anticoagulation protocol: Yes   Plan:  Coumadin to 2.5mg  po x 1 today.  Marvon Shillingburg S. , PharmD, BCPS Clinical Staff Pharmacist Pager [phone]  [phone] [place] 07/17/2013,11:18 AM

## 2013-07-18 LAB — PROTIME-INR
INR: 1.9 — AB (ref 0.00–1.49)
PROTHROMBIN TIME: 21.2 s — AB (ref 11.6–15.2)

## 2013-07-18 LAB — CBC
HCT: 25.9 % — ABNORMAL LOW (ref 36.0–46.0)
HEMOGLOBIN: 7.9 g/dL — AB (ref 12.0–15.0)
MCH: 23.9 pg — ABNORMAL LOW (ref 26.0–34.0)
MCHC: 30.5 g/dL (ref 30.0–36.0)
MCV: 78.2 fL (ref 78.0–100.0)
Platelets: 237 10*3/uL (ref 150–400)
RBC: 3.31 MIL/uL — AB (ref 3.87–5.11)
RDW: 17.2 % — ABNORMAL HIGH (ref 11.5–15.5)
WBC: 5.3 10*3/uL (ref 4.0–10.5)

## 2013-07-18 MED ORDER — HYDROCODONE-ACETAMINOPHEN 10-325 MG PO TABS
1.0000 | ORAL_TABLET | ORAL | Status: DC | PRN
  Administered 2013-07-18 – 2013-07-20 (×6): 1 via ORAL
  Filled 2013-07-18 (×6): qty 1

## 2013-07-18 MED ORDER — BRINZOLAMIDE 1 % OP SUSP
1.0000 [drp] | Freq: Three times a day (TID) | OPHTHALMIC | Status: DC
  Administered 2013-07-18 – 2013-07-21 (×11): 1 [drp] via OPHTHALMIC
  Filled 2013-07-18: qty 10

## 2013-07-18 MED ORDER — WARFARIN SODIUM 2.5 MG PO TABS
2.5000 mg | ORAL_TABLET | Freq: Once | ORAL | Status: AC
  Administered 2013-07-18: 2.5 mg via ORAL
  Filled 2013-07-18: qty 1

## 2013-07-18 MED ORDER — LATANOPROST 0.005 % OP SOLN
1.0000 [drp] | Freq: Every day | OPHTHALMIC | Status: DC
  Administered 2013-07-18 – 2013-07-20 (×2): 1 [drp] via OPHTHALMIC
  Filled 2013-07-18 (×2): qty 2.5

## 2013-07-18 NOTE — Progress Notes (Signed)
Coumadin per pharmacy  Assessment: L TKA  Anticoag: VTE prophx. S/p L TKA. Her baseline Hg 10.1, PTLC 396, INR 1.22 (Noted: She was on coumadin previous admit with large increase in INR after 5mg  dose X 3). INR has been 1.22>>1.47>>1.85 > 1.9 (tdoay). Post op Hgb 7.9 overall stable  Goal of Therapy:  INR 2-3 Monitor platelets by anticoagulation protocol: Yes  Plan:  Coumadin to 2.5mg  po x 1 today. Likely d/c home Monday

## 2013-07-18 NOTE — Progress Notes (Signed)
Physical Therapy Treatment Patient Details Name: BREAH JOA MRN: [id number] DOB: 10-01-1940 Today's Date: 07/18/2013 Time: 7939-0300 PT Time Calculation (min): 24 min  PT Assessment / Plan / Recommendation  History of Present Illness pt s/p L TKA   PT Comments   Patient continues to be limited. Will continue to work on ambulation.   Follow Up Recommendations  Home health PT;Supervision/Assistance - 24 hour     Does the patient have the potential to tolerate intense rehabilitation     Barriers to Discharge        Equipment Recommendations  Rolling walker with 5" wheels    Recommendations for Other Services    Frequency 7X/week   Progress towards PT Goals Progress towards PT goals: Progressing toward goals  Plan Current plan remains appropriate    Precautions / Restrictions Precautions Precautions: Fall;Knee Required Braces or Orthoses: Knee Immobilizer - Left Knee Immobilizer - Left: On at all times Restrictions LLE Weight Bearing: Weight bearing as tolerated   Pertinent Vitals/Pain Complains of pain. Did not rate. patient repositioned for comfort     Mobility  Bed Mobility Overal bed mobility: Needs Assistance Bed Mobility: Sit to Supine Supine to sit: Max assist Sit to supine: Min assist General bed mobility comments: Assist for LEs back into bed Transfers Overall transfer level: Needs assistance Equipment used: Rolling walker (2 wheeled) Sit to Stand: +2 physical assistance;Min assist Stand pivot transfers: +2 safety/equipment General transfer comment: max directional v/c's. A to power up into standing Ambulation/Gait Ambulation/Gait assistance: Mod assist Ambulation Distance (Feet): 10 Feet Assistive device: Rolling walker (2 wheeled) Gait Pattern/deviations: Step-to pattern;Decreased step length - right;Decreased step length - left General Gait Details: Pain limiting ambulation this session. patient not wanting to put weight through L LE. Max  encouragement provided    Exercises Total Joint Exercises Quad Sets: AROM;Left;10 reps Heel Slides: AAROM;Left;10 reps Hip ABduction/ADduction: AAROM;Left;10 reps Straight Leg Raises: AAROM;Left;10 reps   PT Diagnosis:    PT Problem List:   PT Treatment Interventions:     PT Goals (current goals can now be found in the care plan section)    Visit Information  Last PT Received On: 07/18/13 Assistance Needed: +2 History of Present Illness: pt s/p L TKA    Subjective Data      Cognition  Cognition Arousal/Alertness: Awake/alert Behavior During Therapy: Anxious Overall Cognitive Status: Within Functional Limits for tasks assessed    Balance     End of Session PT - End of Session Equipment Utilized During Treatment: Gait belt Activity Tolerance: Patient limited by pain Patient left: in bed;with call bell/phone within reach Nurse Communication: Mobility status   GP     Fredrich Birks 07/18/2013, 1:54 PM 07/18/2013 Fredrich Birks PTA [phone] pager [phone] office

## 2013-07-18 NOTE — Evaluation (Signed)
Occupational Therapy Evaluation Patient Details Name: Leslie Ball MRN: [id number] DOB: Oct 11, 1940 Today's Date: 07/18/2013 Time: 3244-0102 OT Time Calculation (min): 43 min  OT Assessment / Plan / Recommendation History of present illness pt s/p L TKA   Clinical Impression   Pt demos decline in function and safety with ADLs and ADL mobility safety and would benefit from acute OT services to address impairments to increase level of function and safety    OT Assessment  Patient needs continued OT Services    Follow Up Recommendations  Home health OT;Supervision/Assistance - 24 hour    Barriers to Discharge   none, will have 24 hr sup/A at home per pt and  her son  Equipment Recommendations  3 in 1 bedside comode;Other (comment);Tub/shower bench (ADL A/E)    Recommendations for Other Services    Frequency  Min 2X/week    Precautions / Restrictions Precautions Precautions: Fall;Knee Required Braces or Orthoses: Knee Immobilizer - Left Knee Immobilizer - Left: On at all times Restrictions Weight Bearing Restrictions: Yes LLE Weight Bearing: Weight bearing as tolerated   Pertinent Vitals/Pain 4/10 L LE    ADL  Grooming: Performed;Wash/dry hands;Wash/dry face;Min guard Where Assessed - Grooming: Supported standing Upper Body Bathing: Simulated;Supervision/safety;Set up Where Assessed - Upper Body Bathing: Unsupported sitting Lower Body Bathing: Simulated;Maximal assistance Upper Body Dressing: Performed;Supervision/safety;Set up Where Assessed - Upper Body Dressing: Unsupported sitting Lower Body Dressing: Maximal assistance;+1 Total assistance Toilet Transfer: Performed;Moderate assistance Toilet Transfer Method: Sit to stand Toilet Transfer Equipment: Raised toilet seat with arms (or 3-in-1 over toilet) Toileting - Clothing Manipulation and Hygiene: Performed;Moderate assistance Where Assessed - Toileting Clothing Manipulation and Hygiene: Standing Tub/Shower  Transfer Method: Not assessed Equipment Used: Reacher;Rolling walker;Sock aid;Gait belt;Knee Immobilizer;Long-handled shoe horn;Long-handled sponge Transfers/Ambulation Related to ADLs: max directional v/c's. A to power up into standing ADL Comments: Pt and her son provided with education and demo of ADL A/E for home use    OT Diagnosis: Generalized weakness;Acute pain  OT Problem List: Decreased strength;Decreased activity tolerance;Impaired balance (sitting and/or standing);Pain OT Treatment Interventions: Self-care/ADL training;Therapeutic activities;Therapeutic exercise;Neuromuscular education;Patient/family education;DME and/or AE instruction;Balance training   OT Goals(Current goals can be found in the care plan section) Acute Rehab OT Goals Patient Stated Goal: get better and go home OT Goal Formulation: With patient/family Time For Goal Achievement: 07/25/13 Potential to Achieve Goals: Good ADL Goals Pt Will Perform Grooming: with set-up;with supervision;standing Pt Will Perform Lower Body Bathing: with mod assist;with min assist;with caregiver independent in assisting;with adaptive equipment;sitting/lateral leans;sit to/from stand Pt Will Perform Lower Body Dressing: with max assist;with mod assist;with caregiver independent in assisting;with adaptive equipment;sitting/lateral leans;sit to/from stand Pt Will Transfer to Toilet: with min assist;with min guard assist;ambulating;grab bars;regular height toilet (3 in 1 over toilet) Pt Will Perform Toileting - Clothing Manipulation and hygiene: with min assist;sit to/from stand Pt Will Perform Tub/Shower Transfer: with min assist;tub bench  Visit Information  Last OT Received On: 07/18/13 Assistance Needed: +1 History of Present Illness: pt s/p L TKA       Prior Functioning     Home Living Family/patient expects to be discharged to:: Private residence Living Arrangements: Alone Available Help at Discharge: Family;Available 24  hours/day Type of Home: House Home Access: Level entry Home Layout: One level Prior Function Level of Independence: Independent Communication Communication: Prefers language other than English Dominant Hand: Right         Vision/Perception Vision - History Baseline Vision: Wears glasses only for reading Patient Visual Report: No change  from baseline Perception Perception: Within Functional Limits   Cognition  Cognition Arousal/Alertness: Awake/alert Behavior During Therapy: WFL for tasks assessed/performed Overall Cognitive Status: Within Functional Limits for tasks assessed    Extremity/Trunk Assessment Upper Extremity Assessment Upper Extremity Assessment: Overall WFL for tasks assessed;Generalized weakness Lower Extremity Assessment Lower Extremity Assessment: Defer to PT evaluation Cervical / Trunk Assessment Cervical / Trunk Assessment: Normal     Mobility Bed Mobility Overal bed mobility: Needs Assistance Bed Mobility: Sit to Supine Supine to sit: Max assist Sit to supine: Min assist General bed mobility comments: assist with L LE Transfers Overall transfer level: Needs assistance Equipment used: Rolling walker (2 wheeled) Transfers: Sit to/from Stand Sit to Stand: Mod assist Stand pivot transfers: +2 safety/equipment General transfer comment: max directional v/c's. A to power up into standing        Balance Balance Overall balance assessment: Needs assistance Sitting-balance support: No upper extremity supported;Feet supported Sitting balance-Leahy Scale: Good Standing balance support: Single extremity supported;Bilateral upper extremity supported;During functional activity Standing balance-Leahy Scale: Fair   End of Session OT - End of Session Equipment Utilized During Treatment: Gait belt;Rolling walker;Other (comment) (3 in 1 over toilet) Activity Tolerance: Patient tolerated treatment well;Patient limited by fatigue Patient left: in chair;with  call bell/phone within reach CPM Left Knee CPM Left Knee: Off  GO     Margaretmary Eddy [facility] 07/18/2013, 3:30 PM

## 2013-07-18 NOTE — Progress Notes (Signed)
Subjective: Pt stable - itching   Objective: Vital signs in last 24 hours: Temp:  [98 F (36.7 C)-98.9 F (37.2 C)] 98.9 F (37.2 C) (02/27 0646) Pulse Rate:  [80-85] 85 (02/27 0646) Resp:  [18] 18 (02/27 0800) BP: (109-120)/(54-64) 120/64 mmHg (02/27 0646) SpO2:  [93 %-99 %] 99 % (02/27 0800) Weight:  [83.462 kg (184 lb)] 83.462 kg (184 lb) (02/27 0900)  Intake/Output from previous day: 02/26 0701 - 02/27 0700 In: 240 [P.O.:240] Out: -  Intake/Output this shift: Total I/O In: 240 [P.O.:240] Out: -   Exam:  Neurovascular intact Sensation intact distally Intact pulses distally  Labs:  Recent Labs  07/16/13 0450 07/17/13 0512 07/18/13 0407  HGB 8.5* 8.1* 7.9*    Recent Labs  07/17/13 0512 07/18/13 0407  WBC 5.9 5.3  RBC 3.38* 3.31*  HCT 26.3* 25.9*  PLT 253 237    Recent Labs  07/16/13 0450  NA 140  K 4.3  CL 102  CO2 25  BUN 16  CREATININE 0.76  GLUCOSE 141*  CALCIUM 8.5    Recent Labs  07/17/13 0512 07/18/13 0407  INR 1.85* 1.90*    Assessment/Plan: Plan PT and CPM this weekend - anticipate dc Monday - incision OK   DEAN,GREGORY SCOTT 07/18/2013, 9:46 AM

## 2013-07-18 NOTE — Progress Notes (Signed)
Chaplain offered spiritual and emotional support to pt by visiting and administering the sacrament of Holy Communion. Pt expressed gratitude to chaplain for visit and for administering the sacrament.     07/18/13 1500  Clinical Encounter Type  Visited With Patient  Visit Type Spiritual support  Referral From Patient  Spiritual Encounters  Spiritual Needs Ritual;Prayer   Rulon Abide, chaplain pager [phone]

## 2013-07-18 NOTE — Progress Notes (Signed)
Physical Therapy Treatment Patient Details Name: Leslie Ball MRN: [id number] DOB: 24-Aug-1940 Today's Date: 07/18/2013 Time: 2992-4268 PT Time Calculation (min): 25 min  PT Assessment / Plan / Recommendation  History of Present Illness pt s/p L TKA   PT Comments   Patient continues to be limited by pain this session. Will continue to work on ambulation as patient is planning to DC home Monday.   Follow Up Recommendations  Home health PT;Supervision/Assistance - 24 hour     Does the patient have the potential to tolerate intense rehabilitation     Barriers to Discharge        Equipment Recommendations  Rolling walker with 5" wheels    Recommendations for Other Services    Frequency 7X/week   Progress towards PT Goals Progress towards PT goals: Progressing toward goals  Plan Current plan remains appropriate    Precautions / Restrictions Precautions Precautions: Fall;Knee Required Braces or Orthoses: Knee Immobilizer - Left Knee Immobilizer - Left: On at all times Restrictions Weight Bearing Restrictions: Yes LLE Weight Bearing: Weight bearing as tolerated   Pertinent Vitals/Pain Not rated but patient grimaces throughout. patient repositioned for comfort     Mobility  Bed Mobility Bed Mobility: Supine to Sit Supine to sit: Max assist General bed mobility comments: assist for LEs and trunk Transfers Overall transfer level: Needs assistance Equipment used: Rolling walker (2 wheeled) Sit to Stand: Mod assist;+2 physical assistance Stand pivot transfers: +2 safety/equipment General transfer comment: max directional v/c's. A to power up into standing Ambulation/Gait Ambulation/Gait assistance: +2 safety/equipment;Mod assist Ambulation Distance (Feet): 10 Feet Assistive device: Rolling walker (2 wheeled) Gait Pattern/deviations: Step-to pattern;Decreased step length - right;Decreased step length - left General Gait Details: Pain limiting ambulation this session.  patient not wanting to put weight through L LE. Max encouragement provided    Exercises Total Joint Exercises Quad Sets: AROM;Left;10 reps Heel Slides: AAROM;Left;10 reps Hip ABduction/ADduction: AAROM;Left;10 reps Straight Leg Raises: AAROM;Left;10 reps   PT Diagnosis:    PT Problem List:   PT Treatment Interventions:     PT Goals (current goals can now be found in the care plan section)    Visit Information  Last PT Received On: 07/18/13 Assistance Needed: +2 History of Present Illness: pt s/p L TKA    Subjective Data      Cognition  Cognition Arousal/Alertness: Awake/alert Behavior During Therapy: Anxious Overall Cognitive Status: Within Functional Limits for tasks assessed    Balance     End of Session PT - End of Session Equipment Utilized During Treatment: Gait belt Activity Tolerance: Patient limited by pain Patient left: in chair;with call bell/phone within reach Nurse Communication: Mobility status   GP     Fredrich Birks 07/18/2013, 12:48 PM 07/18/2013 Fredrich Birks PTA [phone] pager [phone] office

## 2013-07-19 LAB — PROTIME-INR
INR: 2.05 — AB (ref 0.00–1.49)
PROTHROMBIN TIME: 22.5 s — AB (ref 11.6–15.2)

## 2013-07-19 LAB — BODY FLUID CULTURE: CULTURE: NO GROWTH

## 2013-07-19 MED ORDER — WARFARIN SODIUM 2.5 MG PO TABS
2.5000 mg | ORAL_TABLET | Freq: Once | ORAL | Status: AC
  Administered 2013-07-19: 2.5 mg via ORAL
  Filled 2013-07-19 (×2): qty 1

## 2013-07-19 NOTE — Progress Notes (Signed)
   CARE MANAGEMENT NOTE 07/19/2013  Patient:  Leslie Ball, Leslie Ball   Account Number:  [id number]  Date Initiated:  07/16/2013  Documentation initiated by:  Vance Peper  Subjective/Objective Assessment:   73 yr old female s/p left total knee arthroplasty.     Action/Plan:   CM spoke with Patient concerning HH and DME, preopertively setup with [facility], no changes. Has rolling walker, 3in1 and CPM to be delivered. Has family support at discharge.   Anticipated DC Date:  07/17/2013   Anticipated DC Plan:  HOME W HOME HEALTH SERVICES      DC Planning Services  CM consult      [facility]  HOME HEALTH  DURABLE MEDICAL EQUIPMENT   Choice offered to / List presented to:  C-1 Patient   DME arranged  CPM      DME agency  [employer]     HH arranged  HH-2 PT  HH-4 NURSE'S AIDE      HH agency  [facility]   Status of service:  In process, will continue to follow Medicare Important Message given?   (If response is "NO", the following Medicare IM given date fields will be blank) Date Medicare IM given:   Date Additional Medicare IM given:    Discharge Disposition:  HOME W HOME HEALTH SERVICES  Per UR Regulation:    If discussed at Long Length of Stay Meetings, dates discussed:    Comments:  07/19/13 17:20 CM spoke with pt's son, Doreatha Martin [phone] who is requesting help for his mother.  Order for Aide placed per CM consult protocol.  [employer] notified of add-on.  Son would like CM from the weekday to discuss with him what insurance covers as I stated I did not have access to this information on weekends.  This CM will handoff this request to weekday CM.  CM will continue to follow for disposition needs.  Freddy Jaksch, BSN, CM [phone].

## 2013-07-19 NOTE — Progress Notes (Signed)
PT Cancellation Note  Patient Details Name: Leslie Ball MRN: [id number] DOB: May 19, 1941   Cancelled Treatment:    Reason Eval/Treat Not Completed: Patient declined, no reason specified. Patient just back in CPM and requested to hold at this time. Will followup in AM   Robinette, Adline Potter 07/19/2013, 1:59 PM

## 2013-07-19 NOTE — Progress Notes (Signed)
Subjective: 4 Days Post-Op Procedure(s) (LRB): TOTAL KNEE ARTHROPLASTY- left (Left) Patient reports pain as 6 on 0-10 scale.    Objective: Vital signs in last 24 hours: Temp:  [97.7 F (36.5 C)-98.7 F (37.1 C)] 98.7 F (37.1 C) (02/28 0610) Pulse Rate:  [65-74] 74 (02/28 0610) Resp:  [16-18] 16 (02/28 0841) BP: (104-123)/(54-65) 123/65 mmHg (02/28 0610) SpO2:  [95 %-100 %] 97 % (02/28 0905) FiO2 (%):  [21 %] 21 % (02/28 0905)  Intake/Output from previous day: 02/27 0701 - 02/28 0700 In: 960 [P.O.:960] Out: -  Intake/Output this shift: Total I/O In: 120 [P.O.:120] Out: -    Recent Labs  07/17/13 0512 07/18/13 0407  HGB 8.1* 7.9*    Recent Labs  07/17/13 0512 07/18/13 0407  WBC 5.9 5.3  RBC 3.38* 3.31*  HCT 26.3* 25.9*  PLT 253 237   No results found for this basename: NA, K, CL, CO2, BUN, CREATININE, GLUCOSE, CALCIUM,  in the last 72 hours  Recent Labs  07/18/13 0407 07/19/13 0427  INR 1.90* 2.05*    Neurologically intact Sensation intact distally Compartment soft  Assessment/Plan: 4 Days Post-Op Procedure(s) (LRB): TOTAL KNEE ARTHROPLASTY- left (Left) Up with therapy, slow progress in PT-expect D/C on Monday  [facility], Leslie Ball 07/19/2013, 11:29 AM

## 2013-07-19 NOTE — Progress Notes (Signed)
Assessment: L TKA  Anticoag: VTE prophx. S/p L TKA. Her baseline Hg 10.1, PTLC 396, INR 1.22 (Noted: She was on coumadin previous admit with large increase in INR after 5mg  dose X 3). INR has been 1.22>>1.47>>1.85 > 1.9 > 2.05 (today). Post op Hgb 7.9 overall stable on 02/27  Goal of Therapy:  INR 2-3 Monitor platelets by anticoagulation protocol: Yes  Plan:  Coumadin to 2.5mg  po x 1 today. Likely d/c home Monday

## 2013-07-19 NOTE — Progress Notes (Signed)
Orthopedic Tech Progress Note Patient Details:  Leslie Ball 02-May-1941 [id number] Patient already in CPM when checked at 1500 for Ortho Tech CPM rounds CPM Left Knee CPM Left Knee: On Left Knee Flexion (Degrees): 55 Left Knee Extension (Degrees): 0 Additional Comments: 5 hours total   Asia R Thompson 07/19/2013, 3:31 PM

## 2013-07-19 NOTE — Progress Notes (Signed)
Physical Therapy Treatment Patient Details Name: Leslie Ball MRN: [id number] DOB: [birth year] Today's Date: 07/19/2013 Time: 4098-1191 PT Time Calculation (min): 33 min  PT Assessment / Plan / Recommendation  History of Present Illness pt s/p L TKA   PT Comments   Patient able to make good progress with ambulation today. Still complains of pain limiting increased ambulation.   Follow Up Recommendations  Home health PT;Supervision/Assistance - 24 hour     Does the patient have the potential to tolerate intense rehabilitation     Barriers to Discharge        Equipment Recommendations  Rolling walker with 5" wheels    Recommendations for Other Services    Frequency 7X/week   Progress towards PT Goals Progress towards PT goals: Progressing toward goals  Plan Current plan remains appropriate    Precautions / Restrictions Precautions Precautions: Fall;Knee Required Braces or Orthoses: Knee Immobilizer - Left Knee Immobilizer - Left: On at all times Restrictions LLE Weight Bearing: Weight bearing as tolerated   Pertinent Vitals/Pain Complained of pain but did not rate. patient repositioned for comfort     Mobility  Bed Mobility Overal bed mobility: Needs Assistance Bed Mobility: Sit to Supine Sit to supine: Min assist General bed mobility comments: assist with L LE Transfers Overall transfer level: Needs assistance Equipment used: Rolling walker (2 wheeled) Sit to Stand: Min assist General transfer comment: Cues for hand placement. A for balance and to ensure stability Ambulation/Gait Ambulation/Gait assistance: Min assist Ambulation Distance (Feet): 30 Feet Assistive device: Rolling walker (2 wheeled) Gait Pattern/deviations: Step-to pattern General Gait Details: Patient able to ambulation somewhat further this session. Cues for posture and steppage with L LE.     Exercises Total Joint Exercises Quad Sets: AROM;Left;10 reps Heel Slides: AAROM;Left;10 reps Hip  ABduction/ADduction: AAROM;Left;10 reps Straight Leg Raises: AAROM;Left;10 reps   PT Diagnosis:    PT Problem List:   PT Treatment Interventions:     PT Goals (current goals can now be found in the care plan section)    Visit Information  Last PT Received On: 07/19/13 Assistance Needed: +1 History of Present Illness: pt s/p L TKA    Subjective Data      Cognition  Cognition Arousal/Alertness: Awake/alert Behavior During Therapy: WFL for tasks assessed/performed Overall Cognitive Status: Within Functional Limits for tasks assessed    Balance     End of Session PT - End of Session Equipment Utilized During Treatment: Gait belt Activity Tolerance: Patient tolerated treatment well;Patient limited by pain Patient left: in bed;with call bell/phone within reach Nurse Communication: Mobility status   GP     Fredrich Birks 07/19/2013, 11:23 AM 07/19/2013 Fredrich Birks PTA [phone] pager [phone] office

## 2013-07-20 LAB — ANAEROBIC CULTURE

## 2013-07-20 LAB — PROTIME-INR
INR: 2.17 — ABNORMAL HIGH (ref 0.00–1.49)
Prothrombin Time: 23.5 seconds — ABNORMAL HIGH (ref 11.6–15.2)

## 2013-07-20 MED ORDER — ALUM & MAG HYDROXIDE-SIMETH 200-200-20 MG/5ML PO SUSP: 30.0000 mL | Freq: Four times a day (QID) | ORAL | Status: DC | PRN

## 2013-07-20 MED ORDER — WARFARIN SODIUM 2.5 MG PO TABS
2.5000 mg | ORAL_TABLET | Freq: Once | ORAL | Status: AC
  Administered 2013-07-20: 2.5 mg via ORAL
  Filled 2013-07-20: qty 1

## 2013-07-20 NOTE — Progress Notes (Signed)
Physical Therapy Treatment Patient Details Name: Leslie Ball MRN: [id number] DOB: 12/28/1940 Today's Date: 07/20/2013 Time: 7829-5621 PT Time Calculation (min): 44 min  PT Assessment / Plan / Recommendation  History of Present Illness pt s/p L TKA   PT Comments   Continuing progress with mobility, amb, showing increasing activity tolerance Pt is hoping to dc tomorrow, and this PT feels like she in on track, will need clarification of assist available to her and if we need to cover stair training   Follow Up Recommendations  Home health PT;Supervision/Assistance - 24 hour     Does the patient have the potential to tolerate intense rehabilitation     Barriers to Discharge        Equipment Recommendations  Rolling walker with 5" wheels    Recommendations for Other Services    Frequency 7X/week   Progress towards PT Goals Progress towards PT goals: Progressing toward goals;Goals met and updated - see care plan  Plan Current plan remains appropriate    Precautions / Restrictions Precautions Precautions: Fall;Knee Required Braces or Orthoses: Knee Immobilizer - Left Knee Immobilizer - Left: On at all times Restrictions Weight Bearing Restrictions: Yes LLE Weight Bearing: Weight bearing as tolerated   Pertinent Vitals/Pain Did not rate pain; but definite pain with flex/ext patient repositioned for comfort     Mobility  Bed Mobility Overal bed mobility: Needs Assistance Bed Mobility: Supine to Sit Supine to sit: Min guard General bed mobility comments: Used rails; Cues and encouragement to perform task without help Transfers Overall transfer level: Needs assistance Equipment used: Rolling walker (2 wheeled) Transfers: Sit to/from Stand Sit to Stand: Min guard;Min assist General transfer comment: Able to stand from bed, pushing from bedrail withou minguard assist form safety, but not requiring actual physical assist; Needing phsyical assist to stand from small chair  without armrests Ambulation/Gait Ambulation/Gait assistance: Min guard Ambulation Distance (Feet): 70 Feet (x2 with one seated rest break) Assistive device: Rolling walker (2 wheeled) Gait Pattern/deviations: Step-to pattern;Trunk flexed General Gait Details: Patient able to ambulation somewhat further this session. Continued  Cues for posture; lowered RW for optimal fit    Exercises Total Joint Exercises Short Arc Quad: AAROM;Left;10 reps Heel Slides: AAROM;Left;10 reps Straight Leg Raises: AAROM;Left;10 reps Goniometric ROM: L knee flex to 60 deg   PT Diagnosis:    PT Problem List:   PT Treatment Interventions:     PT Goals (current goals can now be found in the care plan section) Acute Rehab PT Goals Patient Stated Goal: get better and go home PT Goal Formulation: With patient Time For Goal Achievement: 07/23/13 Potential to Achieve Goals: Good  Visit Information  Last PT Received On: 07/20/13 Assistance Needed: +1 History of Present Illness: pt s/p L TKA    Subjective Data  Subjective: Reports moving is difficult, but she is willing to work Patient Stated Goal: get better and go home   Cognition  Cognition Arousal/Alertness: Awake/alert Behavior During Therapy: WFL for tasks assessed/performed Overall Cognitive Status: Within Functional Limits for tasks assessed    Balance     End of Session PT - End of Session Activity Tolerance: Patient tolerated treatment well Patient left: in chair;with call bell/phone within reach CPM Left Knee CPM Left Knee: On Left Knee Flexion (Degrees): [address]   Leslie Ball     Leslie Ball Leslie Ball, Edgar  07/20/2013, 9:50 AM

## 2013-07-20 NOTE — Progress Notes (Signed)
Subjective: 5 Days Post-Op Procedure(s) (LRB): TOTAL KNEE ARTHROPLASTY- left (Left) Patient reports pain as 3 on 0-10 scale.    Objective: Vital signs in last 24 hours: Temp:  [97.5 F (36.4 C)-98.6 F (37 C)] 97.5 F (36.4 C) (03/01 0628) Pulse Rate:  [64-68] 66 (03/01 0628) Resp:  [16-17] 16 (03/01 0628) BP: (115-141)/(62-70) 141/70 mmHg (03/01 0628) SpO2:  [96 %-100 %] 96 % (03/01 0934)  Intake/Output from previous day: 02/28 0701 - 03/01 0700 In: 720 [P.O.:720] Out: -  Intake/Output this shift: Total I/O In: 240 [P.O.:240] Out: -    Recent Labs  07/18/13 0407  HGB 7.9*    Recent Labs  07/18/13 0407  WBC 5.3  RBC 3.31*  HCT 25.9*  PLT 237   No results found for this basename: NA, K, CL, CO2, BUN, CREATININE, GLUCOSE, CALCIUM,  in the last 72 hours  Recent Labs  07/19/13 0427 07/20/13 0520  INR 2.05* 2.17*  feeling better today with good effort in PT-OK for D/C in am. Does complain of mild epigastric pain and had first BM last night.Abdomen soft, non tender with active BS, chest clear, no nausea-will monitor, try antacids  Neurologically intact ABD soft Intact pulses distally Compartment soft  Assessment/Plan: 5 Days Post-Op Procedure(s) (LRB): TOTAL KNEE ARTHROPLASTY- left (Left) Up with therapy Discharge home with home health in am  [facility], Ramia Sidney W 07/20/2013, 9:58 AM

## 2013-07-20 NOTE — Progress Notes (Signed)
Occupational Therapy Treatment Patient Details Name: Leslie Ball MRN: [id number] DOB: 14-Jul-1940 Today's Date: 07/20/2013 Time: 1941-7408 OT Time Calculation (min): 18 min  OT Assessment / Plan / Recommendation  History of present illness pt s/p L TKA   OT comments  Pt became sick during session-nurse aware. Pt progressing towards goals.   Follow Up Recommendations  Home health OT;Supervision/Assistance - 24 hour    Barriers to Discharge       Equipment Recommendations  3 in 1 bedside comode;Other (comment);Tub/shower bench    Recommendations for Other Services    Frequency Min 2X/week   Progress towards OT Goals Progress towards OT goals: Progressing toward goals-2 goals updated  Plan Discharge plan remains appropriate    Precautions / Restrictions Precautions Precautions: Fall;Knee Required Braces or Orthoses: Knee Immobilizer - Left Knee Immobilizer - Left: On at all times Restrictions Weight Bearing Restrictions: Yes LLE Weight Bearing: Weight bearing as tolerated   Pertinent Vitals/Pain Pain 6/10. Notified nurse.    ADL  Grooming: Wash/dry face;Min guard Where Assessed - Grooming: Supported standing Toilet Transfer: Hydrographic surveyor Method: Sit to Barista: Raised toilet seat with arms (or 3-in-1 over toilet) Toileting - Clothing Manipulation and Hygiene: Min guard Where Assessed - Engineer, mining and Hygiene: Standing;Sit on 3-in-1 or toilet Equipment Used: Gait belt;Knee Immobilizer;Rolling walker Transfers/Ambulation Related to ADLs: Min guard ADL Comments: Educated on dressing technique and explained benefit of reaching down to left foot for ADLs as it increases ROM in knee. Pt ambulated to bathroom to perform toileting and then went to sink and became sick and vomitted in sink. Educated on purpose of knee immobilizer.    OT Diagnosis:    OT Problem List:   OT Treatment Interventions:     OT Goals(current  goals can now be found in the care plan section) Acute Rehab OT Goals Patient Stated Goal: not stated OT Goal Formulation: With patient/family Time For Goal Achievement: 07/25/13 Potential to Achieve Goals: Good  Visit Information  Last OT Received On: 07/20/13 Assistance Needed: +1 History of Present Illness: pt s/p L TKA    Subjective Data      Prior Functioning       Cognition  Cognition Arousal/Alertness: Awake/alert Behavior During Therapy: WFL for tasks assessed/performed Overall Cognitive Status: Within Functional Limits for tasks assessed    Mobility  Bed Mobility Overal bed mobility: Needs Assistance Bed Mobility: Supine to Sit;Sit to Supine Supine to sit: Min guard Sit to supine: Min assist General bed mobility comments: Assistance with LE's when returning to supine position.  Transfers Overall transfer level: Needs assistance Equipment used: Rolling walker (2 wheeled) Transfers: Sit to/from Stand Sit to Stand: Min guard General transfer comment: cues for technique.    Exercises      Balance    End of Session OT - End of Session Equipment Utilized During Treatment: Gait belt;Rolling walker;Left knee immobilizer Activity Tolerance: Other (comment);Patient limited by fatigue (nausea) Patient left: in bed;with call bell/phone within reach;with family/visitor present Nurse Communication: Other (comment) (vomitted in sink) CPM Left Knee CPM Left Knee: Off  GO     Earlie Raveling OTR/L [phone] 07/20/2013, 4:19 PM

## 2013-07-20 NOTE — Progress Notes (Signed)
Assessment: L TKA  Anticoag: VTE prophx. S/p L TKA. Her baseline Hg 10.1, PTLC 396, INR 1.22 (Noted: She was on coumadin previous admit with large increase in INR after 5mg  dose X 3). INR has been 1.22>>1.47>>1.85 > 1.9 > 2.05 > 2.17 (today). Post op Hgb 7.9 overall stable on 02/27  Goal of Therapy:  INR 2-3 Monitor platelets by anticoagulation protocol: Yes  Plan:  Coumadin to 2.5mg  po x 1 today. Likely d/c home Monday

## 2013-07-20 NOTE — Progress Notes (Signed)
Physical Therapy Treatment Patient Details Name: Leslie Ball MRN: [id number] DOB: 09-Dec-1940 Today's Date: 07/20/2013 Time: 4888-9169 PT Time Calculation (min): 33 min  PT Assessment / Plan / Recommendation  History of Present Illness pt s/p L TKA   PT Comments   Activity tolerance this PM limited by nausea, but able to complete stair training; on track for dc tomorrow  Follow Up Recommendations  Home health PT;Supervision/Assistance - 24 hour     Does the patient have the potential to tolerate intense rehabilitation     Barriers to Discharge        Equipment Recommendations  Rolling walker with 5" wheels    Recommendations for Other Services    Frequency 7X/week   Progress towards PT Goals Progress towards PT goals: Progressing toward goals  Plan Current plan remains appropriate    Precautions / Restrictions Precautions Precautions: Fall;Knee Required Braces or Orthoses: Knee Immobilizer - Left Knee Immobilizer - Left: On at all times Restrictions Weight Bearing Restrictions: Yes LLE Weight Bearing: Weight bearing as tolerated   Pertinent Vitals/Pain 6-7/10 L knee patient repositioned for comfort In CPM    Mobility  Bed Mobility Overal bed mobility: Needs Assistance Bed Mobility: Supine to Sit;Sit to Supine Supine to sit: Min guard Sit to supine: Min assist General bed mobility comments: Min Assistance with LE's when returning to supine position.  Transfers Overall transfer level: Needs assistance Equipment used: Rolling walker (2 wheeled) Transfers: Sit to/from Stand Sit to Stand: Min guard General transfer comment: cues for technique. Ambulation/Gait Ambulation/Gait assistance: Min guard Ambulation Distance (Feet): 10 Feet Assistive device: Rolling walker (2 wheeled) Gait Pattern/deviations: Step-to pattern General Gait Details: managing slowly but well Stairs: Yes Stairs assistance: Min guard Stair Management: No rails;Forwards;With walker;Step to  pattern Number of Stairs: 1 General stair comments: verbal and demo cues for sequence and technique    Exercises     PT Diagnosis:    PT Problem List:   PT Treatment Interventions:     PT Goals (current goals can now be found in the care plan section) Acute Rehab PT Goals Patient Stated Goal: Hopes to go to [place] in 5 months PT Goal Formulation: With patient Time For Goal Achievement: 07/23/13 Potential to Achieve Goals: Good  Visit Information  Last PT Received On: 07/20/13 Assistance Needed: +1 History of Present Illness: pt s/p L TKA    Subjective Data  Subjective: reports is very tired; got sick and vomitted earlier Patient Stated Goal: Hopes to go to [place] in 5 months   Cognition  Cognition Arousal/Alertness: Awake/alert Behavior During Therapy: WFL for tasks assessed/performed Overall Cognitive Status: Within Functional Limits for tasks assessed    Balance     End of Session PT - End of Session Activity Tolerance: Patient tolerated treatment well Patient left: in bed;in CPM;with call bell/phone within reach;with family/visitor present Nurse Communication: Mobility status CPM Left Knee CPM Left Knee: Off   GP     Leslie Ball, Leslie Ball [phone]  07/20/2013, 5:03 PM

## 2013-07-20 NOTE — Progress Notes (Signed)
Orthopedic Tech Progress Note Patient Details:  Leslie Ball 02-Jun-1940 [id number] Checked with patient for 1500 CPM rounds. Patient was asleep at the time. Spoke with patient's daughter who was in the room and told her CPM could be applied at a later time.  Patient ID: Leslie Ball, female   DOB: 02-17-1941, 73 y.o.   MRN: [id number]   Orie Rout 07/20/2013, 2:41 PM

## 2013-07-21 DIAGNOSIS — K5900 Constipation, unspecified: Secondary | ICD-10-CM | POA: Clinically undetermined

## 2013-07-21 DIAGNOSIS — R112 Nausea with vomiting, unspecified: Secondary | ICD-10-CM

## 2013-07-21 DIAGNOSIS — R1013 Epigastric pain: Secondary | ICD-10-CM | POA: Diagnosis present

## 2013-07-21 LAB — PROTIME-INR
INR: 2.11 — ABNORMAL HIGH (ref 0.00–1.49)
Prothrombin Time: 23 seconds — ABNORMAL HIGH (ref 11.6–15.2)

## 2013-07-21 LAB — URINALYSIS, ROUTINE W REFLEX MICROSCOPIC
BILIRUBIN URINE: NEGATIVE
GLUCOSE, UA: NEGATIVE mg/dL
HGB URINE DIPSTICK: NEGATIVE
Ketones, ur: NEGATIVE mg/dL
Leukocytes, UA: NEGATIVE
NITRITE: NEGATIVE
Protein, ur: NEGATIVE mg/dL
SPECIFIC GRAVITY, URINE: 1.019 (ref 1.005–1.030)
Urobilinogen, UA: 1 mg/dL (ref 0.0–1.0)
pH: 8.5 — ABNORMAL HIGH (ref 5.0–8.0)

## 2013-07-21 MED ORDER — PROMETHAZINE HCL 12.5 MG PO TABS: 50.0000 mg | ORAL_TABLET | Freq: Four times a day (QID) | ORAL | Status: DC | PRN

## 2013-07-21 MED ORDER — METHOCARBAMOL 500 MG PO TABS: 500.0000 mg | ORAL_TABLET | Freq: Four times a day (QID) | ORAL | Status: DC | PRN

## 2013-07-21 MED ORDER — WARFARIN SODIUM 5 MG PO TABS: 5.0000 mg | ORAL_TABLET | Freq: Every day | ORAL | Status: DC

## 2013-07-21 MED ORDER — OMEPRAZOLE 40 MG PO CPDR: 40.0000 mg | DELAYED_RELEASE_CAPSULE | Freq: Every day | ORAL | Status: DC

## 2013-07-21 MED ORDER — HYDROCODONE-ACETAMINOPHEN 10-325 MG PO TABS: 1.0000 | ORAL_TABLET | ORAL | Status: DC | PRN

## 2013-07-21 MED ORDER — DSS 100 MG PO CAPS: 100.0000 mg | ORAL_CAPSULE | Freq: Two times a day (BID) | ORAL | Status: DC

## 2013-07-21 MED ORDER — WARFARIN SODIUM 3 MG PO TABS
3.0000 mg | ORAL_TABLET | Freq: Once | ORAL | Status: AC
  Administered 2013-07-21: 3 mg via ORAL
  Filled 2013-07-21: qty 1

## 2013-07-21 MED ORDER — ALUM & MAG HYDROXIDE-SIMETH 200-200-20 MG/5ML PO SUSP: 30.0000 mL | Freq: Four times a day (QID) | ORAL | Status: DC | PRN

## 2013-07-21 NOTE — Discharge Summary (Signed)
Physician Discharge Summary  Patient ID: Leslie Ball MRN: [id number] DOB/AGE: [birth year] 73 y.o.  Admit date: 07/15/2013 Discharge date: 07/21/2013  Admission Diagnoses:  Active Problems:   Arthritis of knee   Discharge Diagnoses:  Same  Surgeries: Procedure(s): TOTAL KNEE ARTHROPLASTY- left on 07/15/2013   Consultants:    Discharged Condition: Stable  Hospital Course: Leslie Ball is an 73 y.o. female who was admitted 07/15/2013 with a chief complaint of eft knee pain, and found to have a diagnosis of left knee arthritis.  They were brought to the operating room on 07/15/2013 and underwent the above named procedures. She made slow but steady progress in PT and was dced home POD 6 with therapeutic INR.Follow up one week   Antibiotics given:  Anti-infectives   Start     Dose/Rate Route Frequency Ordered Stop   07/15/13 1930  ceFAZolin (ANCEF) IVPB 1 g/50 mL premix     1 g 100 mL/hr over 30 Minutes Intravenous 3 times per day 07/15/13 1719 07/16/13 0628   07/15/13 0600  ceFAZolin (ANCEF) IVPB 2 g/50 mL premix     2 g 100 mL/hr over 30 Minutes Intravenous On call to O.R. 07/14/13 1407 07/15/13 1127    .  Recent vital signs:  Filed Vitals:   07/21/13 0557  BP: 125/70  Pulse: 53  Temp: 98.5 F (36.9 C)  Resp: 16    Recent laboratory studies:  Results for orders placed during the hospital encounter of 07/15/13  ANAEROBIC CULTURE      Result Value Ref Range   Specimen Description FLUID SYNOVIAL LEFT KNEE     Special Requests FLUID ON SWAB PT ON ANCEF     Gram Stain       Value: MODERATE WBC PRESENT,BOTH PMN AND MONONUCLEAR     NO SQUAMOUS EPITHELIAL CELLS SEEN     NO ORGANISMS SEEN     Performed at [facility]   Culture       Value: NO ANAEROBES ISOLATED     Performed at [facility]   Report Status 07/20/2013 FINAL    BODY FLUID CULTURE      Result Value Ref Range   Specimen Description FLUID SYNOVIAL LEFT KNEE     Special Requests FLUID  ON SWAB PT ON ANCEF     Gram Stain       Value: WBC PRESENT,BOTH PMN AND MONONUCLEAR     NO ORGANISMS SEEN     Performed at [facility]   Culture       Value: NO GROWTH 3 DAYS     Performed at [facility]   Report Status 07/19/2013 FINAL    PROTIME-INR      Result Value Ref Range   Prothrombin Time 17.4 (*) 11.6 - 15.2 seconds   INR 1.47  0.00 - 1.49  CBC      Result Value Ref Range   WBC 6.2  4.0 - 10.5 K/uL   RBC 3.55 (*) 3.87 - 5.11 MIL/uL   Hemoglobin 8.5 (*) 12.0 - 15.0 g/dL   HCT 09.7 (*) 35.3 - 29.9 %   MCV 77.7 (*) 78.0 - 100.0 fL   MCH 23.9 (*) 26.0 - 34.0 pg   MCHC 30.8  30.0 - 36.0 g/dL   RDW 24.2 (*) 68.3 - 41.9 %   Platelets 337  150 - 400 K/uL  BASIC METABOLIC PANEL      Result Value Ref Range   Sodium 140  137 -  147 mEq/L   Potassium 4.3  3.7 - 5.3 mEq/L   Chloride 102  96 - 112 mEq/L   CO2 25  19 - 32 mEq/L   Glucose, Bld 141 (*) 70 - 99 mg/dL   BUN 16  6 - 23 mg/dL   Creatinine, Ser 2.02  0.50 - 1.10 mg/dL   Calcium 8.5  8.4 - 54.2 mg/dL   GFR calc non Af Amer 82 (*) >90 mL/min   GFR calc Af Amer >90  >90 mL/min  PROTIME-INR      Result Value Ref Range   Prothrombin Time 20.8 (*) 11.6 - 15.2 seconds   INR 1.85 (*) 0.00 - 1.49  CBC      Result Value Ref Range   WBC 5.9  4.0 - 10.5 K/uL   RBC 3.38 (*) 3.87 - 5.11 MIL/uL   Hemoglobin 8.1 (*) 12.0 - 15.0 g/dL   HCT 70.6 (*) 23.7 - 62.8 %   MCV 77.8 (*) 78.0 - 100.0 fL   MCH 24.0 (*) 26.0 - 34.0 pg   MCHC 30.8  30.0 - 36.0 g/dL   RDW 31.5 (*) 17.6 - 16.0 %   Platelets 253  150 - 400 K/uL  PROTIME-INR      Result Value Ref Range   Prothrombin Time 21.2 (*) 11.6 - 15.2 seconds   INR 1.90 (*) 0.00 - 1.49  CBC      Result Value Ref Range   WBC 5.3  4.0 - 10.5 K/uL   RBC 3.31 (*) 3.87 - 5.11 MIL/uL   Hemoglobin 7.9 (*) 12.0 - 15.0 g/dL   HCT 73.7 (*) 10.6 - 26.9 %   MCV 78.2  78.0 - 100.0 fL   MCH 23.9 (*) 26.0 - 34.0 pg   MCHC 30.5  30.0 - 36.0 g/dL   RDW 48.5 (*) 46.2 - 70.3 %    Platelets 237  150 - 400 K/uL  PROTIME-INR      Result Value Ref Range   Prothrombin Time 22.5 (*) 11.6 - 15.2 seconds   INR 2.05 (*) 0.00 - 1.49  PROTIME-INR      Result Value Ref Range   Prothrombin Time 23.5 (*) 11.6 - 15.2 seconds   INR 2.17 (*) 0.00 - 1.49  PROTIME-INR      Result Value Ref Range   Prothrombin Time 23.0 (*) 11.6 - 15.2 seconds   INR 2.11 (*) 0.00 - 1.49    Discharge Medications:     Medication List    STOP taking these medications       traMADol 50 MG tablet  Commonly known as:  ULTRAM      TAKE these medications       bisoprolol 10 MG tablet  Commonly known as:  ZEBETA  Take 10 mg by mouth at bedtime.     brinzolamide 1 % ophthalmic suspension  Commonly known as:  AZOPT  Place 1 drop into the left eye 3 (three) times daily.     DSS 100 MG Caps  Take 100 mg by mouth 2 (two) times daily.     Fluticasone-Salmeterol 250-50 MCG/DOSE Aepb  Commonly known as:  ADVAIR  Inhale 1 puff into the lungs daily as needed (for shortness of breath).     hydrochlorothiazide 12.5 MG capsule  Commonly known as:  MICROZIDE  Take 12.5 mg by mouth daily after supper.     HYDROcodone-acetaminophen 10-325 MG per tablet  Commonly known as:  NORCO  Take 1 tablet by  mouth every 4 (four) hours as needed for moderate pain.     methocarbamol 500 MG tablet  Commonly known as:  ROBAXIN  Take 1 tablet (500 mg total) by mouth every 6 (six) hours as needed for muscle spasms.     theophylline 300 MG 12 hr tablet  Commonly known as:  THEODUR  Take 150 mg by mouth at bedtime.     Travoprost (BAK Free) 0.004 % Soln ophthalmic solution  Commonly known as:  TRAVATAN  Place 1 drop into the left eye at bedtime.        Diagnostic Studies: No results found.  Disposition: 06-Home-Health Care Svc      Discharge Orders   Future Orders Complete By Expires   Call MD / Call 911  As directed    Comments:     If you experience chest pain or shortness of breath, CALL 911  and be transported to the hospital emergency room.  If you develope a fever above 101 F, pus (white drainage) or increased drainage or redness at the wound, or calf pain, call your surgeon's office.   Constipation Prevention  As directed    Comments:     Drink plenty of fluids.  Prune juice may be helpful.  You may use a stool softener, such as Colace (over the counter) 100 mg twice a day.  Use MiraLax (over the counter) for constipation as needed.   Diet - low sodium heart healthy  As directed    Discharge instructions  As directed    Comments:     Weight bearing as tolerated with crutches Keep incision dry CPM machine 6 hours per day   Increase activity slowly as tolerated  As directed          Signed: DEAN,GREGORY SCOTT 07/21/2013, 10:05 AM

## 2013-07-21 NOTE — Progress Notes (Signed)
Occupational Therapy Treatment Patient Details Name: Leslie Ball MRN: [id number] DOB: [birth year] Today's Date: 07/21/2013 Time: 1438-8875 OT Time Calculation (min): 10 min (PT stepped in towards end of session-OT stepped out to provided education to pt's son-OT with pt 8 minutes before turning over to PT)  OT Assessment / Plan / Recommendation  History of present illness pt s/p L TKA   OT comments  Pt progressing towards goals. Provided education to pt and pt's son.   Follow Up Recommendations  Home health OT;Supervision/Assistance - 24 hour    Barriers to Discharge       Equipment Recommendations  3 in 1 bedside comode;Other (comment);Tub/shower bench    Recommendations for Other Services    Frequency Min 2X/week   Progress towards OT Goals Progress towards OT goals: Progressing toward goals  Plan Discharge plan remains appropriate    Precautions / Restrictions Precautions Precautions: Fall;Knee Required Braces or Orthoses: Knee Immobilizer - Left Knee Immobilizer - Left: On at all times Restrictions Weight Bearing Restrictions: Yes LLE Weight Bearing: Weight bearing as tolerated   Pertinent Vitals/Pain No apparent distress.    ADL  Lower Body Dressing: Set up;Supervision/safety Where Assessed - Lower Body Dressing: Supported sit to stand Toilet Transfer: Supervision/safety Statistician Method: Sit to Barista: Bedside commode Equipment Used: Gait belt;Knee Immobilizer;Rolling walker Transfers/Ambulation Related to ADLs: Supervision ADL Comments: Educated on dressing technique and pt donned/doffed socks and donned pants. Educated to wear knee immobilizer when she stands. Recommended sitting to get dressed and standing in front of chair/bed with walker in front when pulling up LB clothing. Spoke with pt's son and recommended having rugs picked up and discussed use of bag on walker. Recommended family being with her when getting in and out of  tub. Pt's son said pt did fine with tub transfer last time and he feels comfortable with this. Reviewed dressing tips with son. Educated on benefit of reaching down to don/doff left sock as it increases ROM in knee.    OT Diagnosis:    OT Problem List:   OT Treatment Interventions:     OT Goals(current goals can now be found in the care plan section) Acute Rehab OT Goals Patient Stated Goal: go home OT Goal Formulation: With patient/family Time For Goal Achievement: 07/25/13 Potential to Achieve Goals: Good  Visit Information  Last OT Received On: 07/21/13 Assistance Needed: +1 History of Present Illness: pt s/p L TKA    Subjective Data      Prior Functioning       Cognition  Cognition Arousal/Alertness: Awake/alert Behavior During Therapy: WFL for tasks assessed/performed Overall Cognitive Status: Within Functional Limits for tasks assessed    Mobility  Transfers Overall transfer level: Needs assistance Equipment used: Rolling walker (2 wheeled) Transfers: Sit to/from Stand Sit to Stand: Supervision General transfer comment: cues for technique.    Exercises      Balance    End of Session OT - End of Session Equipment Utilized During Treatment: Gait belt;Rolling walker;Left knee immobilizer Activity Tolerance: Patient tolerated treatment well Patient left: Other (comment) (with PT)  GO     Earlie Raveling OTR/L [phone] 07/21/2013, 11:14 AM

## 2013-07-21 NOTE — Progress Notes (Signed)
Subjective: Pt stable - making progress with mobilization   Objective: Vital signs in last 24 hours: Temp:  [98 F (36.7 C)-98.5 F (36.9 C)] 98.5 F (36.9 C) (03/02 0557) Pulse Rate:  [53-68] 53 (03/02 0557) Resp:  [16-18] 16 (03/02 0557) BP: (122-140)/(63-70) 125/70 mmHg (03/02 0557) SpO2:  [95 %-96 %] 96 % (03/02 0830)  Intake/Output from previous day: 03/01 0701 - 03/02 0700 In: 600 [P.O.:600] Out: -  Intake/Output this shift: Total I/O In: 240 [P.O.:240] Out: -   Exam:  Neurovascular intact Sensation intact distally Intact pulses distally  Labs: No results found for this basename: HGB,  in the last 72 hours No results found for this basename: WBC, RBC, HCT, PLT,  in the last 72 hours No results found for this basename: NA, K, CL, CO2, BUN, CREATININE, GLUCOSE, CALCIUM,  in the last 72 hours  Recent Labs  07/20/13 0520 07/21/13 0630  INR 2.17* 2.11*    Assessment/Plan: inr ok - ready for dc   Kate Sweetman SCOTT 07/21/2013, 10:02 AM

## 2013-07-21 NOTE — Progress Notes (Signed)
Patient discharged to home accompanied by family. Discharge instructions and rx given and explained and patient stated understanding. IV was removed and patient left unit in a stable condition via wheelchair. 

## 2013-07-21 NOTE — Progress Notes (Signed)
Physical Therapy Treatment Patient Details Name: Leslie Ball MRN: [id number] DOB: Jun 30, 1940 Today's Date: 07/21/2013 Time: 9407-6808 PT Time Calculation (min): 25 min  PT Assessment / Plan / Recommendation  History of Present Illness pt s/p L TKA   PT Comments   Good progress with amb; and noted good L stance stability without KI; Should be able to dc home today from PT standpoint  Follow Up Recommendations  Home health PT;Supervision/Assistance - 24 hour     Does the patient have the potential to tolerate intense rehabilitation     Barriers to Discharge        Equipment Recommendations  Rolling walker with 5" wheels    Recommendations for Other Services    Frequency 7X/week   Progress towards PT Goals Progress towards PT goals: Progressing toward goals  Plan Current plan remains appropriate    Precautions / Restrictions Precautions Precautions: Fall;Knee Required Braces or Orthoses: Knee Immobilizer - Left Knee Immobilizer - Left: On at all times Restrictions Weight Bearing Restrictions: Yes LLE Weight Bearing: Weight bearing as tolerated   Pertinent Vitals/Pain Reports pain L knee; did not rate patient repositioned for comfort and optimal knee ext    Mobility  Transfers Overall transfer level: Needs assistance Equipment used: Rolling walker (2 wheeled) Transfers: Sit to/from Stand Sit to Stand: Supervision General transfer comment: cues for technique. Ambulation/Gait Ambulation/Gait assistance: Supervision;Min guard Ambulation Distance (Feet): 120 Feet Assistive device: Rolling walker (2 wheeled) Gait Pattern/deviations: Step-through pattern (emerging) General Gait Details: Cues to extend bil hips fro fully upright standing; First, longer walk with KI, then walked about 20 without KI, and no noted buckling L knee    Exercises Total Joint Exercises Heel Slides: AAROM;Left;10 reps Straight Leg Raises: AAROM;Left;10 reps   PT Diagnosis:    PT Problem List:    PT Treatment Interventions:     PT Goals (current goals can now be found in the care plan section) Acute Rehab PT Goals Patient Stated Goal: go home PT Goal Formulation: With patient Time For Goal Achievement: 07/23/13 Potential to Achieve Goals: Good  Visit Information  Last PT Received On: 07/21/13 Assistance Needed: +1 History of Present Illness: pt s/p L TKA    Subjective Data  Subjective: Is happy to go home Patient Stated Goal: go home   Cognition  Cognition Arousal/Alertness: Awake/alert Behavior During Therapy: WFL for tasks assessed/performed Overall Cognitive Status: Within Functional Limits for tasks assessed    Balance     End of Session PT - End of Session Activity Tolerance: Patient tolerated treatment well Patient left: in chair;with call bell/phone within reach Nurse Communication: Mobility status;Patient requests pain meds CPM Left Knee CPM Left Knee: On   GP     Van Clines [facility] [place], [place] [phone]  07/21/2013, 2:30 PM

## 2013-07-21 NOTE — Progress Notes (Signed)
[facility] Medical Consultation  Leslie Ball UJW:[id number] DOB: 1940-10-10 DOA: 07/15/2013 PCP: Leslie German, MD  Requesting physician: Leslie Ball Date of consultation:  07/21/13 Reason for consultation: nausea  Impression/Recommendations   Nausea with vomiting: likely medication related. Repeat UA negative. Has been constipated for about a week but had a bowel movement yesterday. May have been contributing. Tolerating a diet currently. No vomiting. Also has some epigastric pain. May be related to Celebrex. Recommend stopping Celebrex. Okay for discharge. I have written a prescription for Protonix and Phenergan as needed. Discussed with Dr. August Ball Active Problems:   Arthritis of knee   Unspecified constipation   Abdominal pain, epigastric: See above. Will treat empirically with Protonix.  Thanks, Dr. August Ball for the consult  Chief Complaint: Nausea  HPI:  73 year old Middle Leslie Ball admitted for total knee replacement. Postoperatively, has had intermittent nausea and vomiting. Reportedly, patient's son is concerned and I have been consulted to assist. Patient reports vomiting yesterday. None today. She thinks maybe medications may be the culprit, possibly opiates. She has been on Celebrex postoperatively. She is complaining of mild epigastric discomfort. She's had no hematemesis or melena. She has had constipation but had a bowel movement yesterday. This seemed to help her nausea.   Review of Systems:  Systems review. As above otherwise negative.  Past Medical History  Diagnosis Date  . Hypertension   . Asthma   . Constipation     occasionally  . Neuromuscular disorder     right hand has "pins and needles" feeling at times  . 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 GERD (gastroesophageal reflux disease)     rare use of Pepto bismol  . Anemia   . Arthritis     "left knee; probably left shoulder &  elbow" (07/15/2013)   Past Surgical History  Procedure Laterality Date  . 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 Total knee arthroplasty Left 07/15/2013  . Joint replacement    . Eye surgery Right   . Refractive surgery Bilateral   . Cataract extraction w/ intraocular lens implant Bilateral   . Retinal detachment surgery Right   . Total knee arthroplasty Left 07/15/2013    Procedure: TOTAL KNEE ARTHROPLASTY- left;  Surgeon: Cammy Copa, MD;  Location: [facility] OR;  Service: Orthopedics;  Laterality: Left;   Social History:  reports that she has never smoked. She has never used smokeless tobacco. She reports that she does not drink alcohol or use illicit drugs.  Allergies  Allergen Reactions  . Chlorhexidine Itching    REACTION: redness   History reviewed. No pertinent family history.  Prior to Admission medications   Medication Sig Start Date End Date Taking? Authorizing Provider  bisoprolol (ZEBETA) 10 MG tablet Take 10 mg by mouth at bedtime.   Yes Historical Provider, MD  brinzolamide (AZOPT) 1 % ophthalmic suspension Place 1 drop into the left eye 3 (three) times daily.   Yes Historical Provider, MD  Fluticasone-Salmeterol (ADVAIR) 250-50 MCG/DOSE AEPB Inhale 1 puff into the lungs daily as needed (for shortness of breath).    Yes Historical Provider, MD  hydrochlorothiazide (MICROZIDE) 12.5 MG capsule Take 12.5 mg by mouth daily after supper.    Yes Historical Provider, MD  theophylline (THEODUR) 300  MG 12 hr tablet Take 150 mg by mouth at bedtime.   Yes Historical Provider, MD  Travoprost, BAK Free, (TRAVATAN) 0.004 % SOLN ophthalmic solution Place 1 drop into the left eye at bedtime.   Yes Historical Provider, MD  alum & mag hydroxide-simeth (MAALOX/MYLANTA) 200-200-20 MG/5ML suspension Take 30 mLs by mouth every 6 (six) hours as needed for  indigestion or heartburn. 07/21/13   Cammy Copa, MD  docusate sodium 100 MG CAPS Take 100 mg by mouth 2 (two) times daily. 07/21/13   Cammy Copa, MD  HYDROcodone-acetaminophen (NORCO) 10-325 MG per tablet Take 1 tablet by mouth every 4 (four) hours as needed for moderate pain. 07/21/13   Cammy Copa, MD  methocarbamol (ROBAXIN) 500 MG tablet Take 1 tablet (500 mg total) by mouth every 6 (six) hours as needed for muscle spasms. 07/21/13   Cammy Copa, MD  omeprazole (PRILOSEC) 40 MG capsule Take 1 capsule (40 mg total) by mouth daily. 07/21/13   Christiane Ha, MD  promethazine (PHENERGAN) 12.5 MG tablet Take 4 tablets (50 mg total) by mouth every 6 (six) hours as needed for nausea or vomiting. 07/21/13   Christiane Ha, MD  warfarin (COUMADIN) 5 MG tablet Take 1 tablet (5 mg total) by mouth daily. 07/21/13   Cammy Copa, MD   Physical Exam: Blood pressure 132/62, pulse 69, temperature 98 F (36.7 C), temperature source Oral, resp. rate 16, height 5\' 2"  (1.575 m), weight 83.462 kg (184 lb), SpO2 99.00%. Filed Vitals:   07/21/13 1400  BP: 132/62  Pulse: 69  Temp: 98 F (36.7 C)  Resp: 16   BP 132/62  Pulse 69  Temp(Src) 98 F (36.7 C) (Oral)  Resp 16  Ht 5\' 2"  (1.575 m)  Wt 83.462 kg (184 lb)  BMI 33.65 kg/m2  SpO2 99%  General Appearance:    Alert, cooperative, no distress, appears stated age  Head:    Normocephalic, without obvious abnormality, atraumatic  Eyes:    PERRL, conjunctiva/corneas clear, EOM's intact, fundi    benign, both eyes     Nose:   Nares normal, septum midline, mucosa normal, no drainage    or sinus tenderness  Throat:   Lips, mucosa, and tongue normal; teeth and gums normal  Neck:   Supple, symmetrical, trachea midline, no adenopathy;    thyroid:  no enlargement/tenderness/nodules; no carotid   bruit or JVD  Back:     Symmetric, no curvature, ROM normal, no CVA tenderness  Lungs:     Clear to auscultation bilaterally,  respirations unlabored  Chest Wall:    No tenderness or deformity   Heart:    Regular rate and rhythm, S1 and S2 normal, no murmur, rub   or gallop     Abdomen:     Soft, non-tender, bowel sounds active all four quadrants,    no masses, no organomegaly  Genitalia:    deferred  Rectal:    deferred  Extremities:   Extremities normal, atraumatic, no cyanosis or edema  Pulses:   2+ and symmetric all extremities  Skin:   Skin color, texture, turgor normal, no rashes or lesions  Lymph nodes:   Cervical, supraclavicular, and axillary nodes normal  Neurologic:   CNII-XII intact, normal strength, sensation and reflexes    throughout    Psych: normal affect Labs on Admission:  Basic Metabolic Panel:  Recent Labs Lab 07/16/13 0450  NA 140  K 4.3  CL 102  CO2 25  GLUCOSE 141*  BUN 16  CREATININE 0.76  CALCIUM 8.5   Liver Function Tests: No results found for this basename: AST, ALT, ALKPHOS, BILITOT, PROT, ALBUMIN,  in the last 168 hours No results found for this basename: LIPASE, AMYLASE,  in the last 168 hours No results found for this basename: AMMONIA,  in the last 168 hours CBC:  Recent Labs Lab 07/16/13 0450 07/17/13 0512 07/18/13 0407  WBC 6.2 5.9 5.3  HGB 8.5* 8.1* 7.9*  HCT 27.6* 26.3* 25.9*  MCV 77.7* 77.8* 78.2  PLT 337 253 237   Cardiac Enzymes: No results found for this basename: CKTOTAL, CKMB, CKMBINDEX, TROPONINI,  in the last 168 hours BNP: No components found with this basename: POCBNP,  CBG: No results found for this basename: GLUCAP,  in the last 168 hours  Radiological Exams on Admission: No results found.  Urinalysis    Component Value Date/Time   COLORURINE YELLOW 07/21/2013 1625   APPEARANCEUR CLOUDY* 07/21/2013 1625   LABSPEC 1.019 07/21/2013 1625   PHURINE 8.5* 07/21/2013 1625   GLUCOSEU NEGATIVE 07/21/2013 1625   HGBUR NEGATIVE 07/21/2013 1625   BILIRUBINUR NEGATIVE 07/21/2013 1625   KETONESUR NEGATIVE 07/21/2013 1625   PROTEINUR NEGATIVE 07/21/2013  1625   UROBILINOGEN 1.0 07/21/2013 1625   NITRITE NEGATIVE 07/21/2013 1625   LEUKOCYTESUR NEGATIVE 07/21/2013 1625     Hollin Crewe L [facility]  07/21/2013, 4:07 PM

## 2013-07-21 NOTE — Progress Notes (Signed)
Assessment: L TKA  Anticoag: VTE prophx. S/p L TKA. Her baseline Hg 10.1, PTLC 396, INR 1.22 (Noted: She was on coumadin previous admit with large increase in INR after 5mg  dose X 3). INR is 2.11 (today). Post op Hgb 7.9 overall stable on 02/27  Goal of Therapy:  INR 2-3 Monitor platelets by anticoagulation protocol: Yes  Plan:  Coumadin to 3 mg po x 1 today. Education has been completed  3/27, PharmD, BCPS Clinical Pharmacist Pager: [phone] 07/21/2013 2:05 PM

## 2015-12-09 ENCOUNTER — Encounter: Payer: Medicaid Other | Admitting: Psychology

## 2015-12-09 ENCOUNTER — Ambulatory Visit: Payer: Medicaid Other | Attending: Ophthalmology | Admitting: Occupational Therapy

## 2015-12-09 DIAGNOSIS — H53413 Scotoma involving central area, bilateral: Secondary | ICD-10-CM | POA: Diagnosis present

## 2015-12-09 NOTE — Therapy (Signed)
[facility] [address] [place], [place], [address] Phone: [phone]   Fax:  [phone]  Occupational Therapy Evaluation  Patient Details  Name: Leslie Ball MRN: [id number] Date of Birth: March 11, 1941 Referring Provider: Dr. Janet Berlin  Encounter Date: 12/09/2015      OT End of Session - 12/09/15 1523    Visit Number 1   Number of Visits 1   Date for OT Re-Evaluation --  n/a   Authorization Type medicaid   OT Start Time 1404   OT Stop Time 1509   OT Time Calculation (min) 65 min   Activity Tolerance Patient tolerated treatment well   Behavior During Therapy [facility] for tasks assessed/performed      Past Medical History  Diagnosis Date  . Hypertension   . Asthma   . Constipation     occasionally  . Neuromuscular disorder     right hand has "pins and needles" feeling at times  . 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 GERD (gastroesophageal reflux disease)     rare use of Pepto bismol  . Anemia   . Arthritis     "left knee; probably left shoulder & elbow" (07/15/2013)    Past Surgical History  Procedure Laterality Date  . Total knee arthroplasty Right 04/15/2013    Procedure: TOTAL KNEE ARTHROPLASTY;  Surgeon: Cammy Copa, MD;  Location: [facility] OR;  Service: Orthopedics;  Laterality: Right;  . Total knee arthroplasty Left 07/15/2013  . Joint replacement    . Eye surgery Right   . Refractive surgery Bilateral   . Cataract extraction w/ intraocular lens implant Bilateral   . Retinal detachment surgery Right   . Total knee arthroplasty Left 07/15/2013    Procedure: TOTAL KNEE ARTHROPLASTY- left;  Surgeon: Cammy Copa, MD;  Location: [facility] OR;  Service: Orthopedics;  Laterality: Left;    There were no vitals filed for this visit.      Subjective Assessment - 12/09/15 1408    Subjective  Pt with macular  degeneration and glaucoma would like to be able to read easier   Pertinent History see Epic   Patient Stated Goals read  better   Currently in Pain? Yes   Pain Score 5    Pain Location Generalized  joint pain   Pain Descriptors / Indicators Aching   Pain Onset More than a month ago   Aggravating Factors  movement   Pain Relieving Factors meds   Multiple Pain Sites No           [facility] OT Assessment - 12/09/15 0001    Assessment   Diagnosis macular degeneration, glaucoma   Referring Provider Dr. Janet Berlin   Onset Date 06/24/15   Precautions   Precautions Fall   Precaution Comments visual impairment   Home  Environment   Family/patient expects to be discharged to: Private residence   Home Layout One level   Lives With Alone   Prior Function   Level of Independence Independent with basic ADLs   Leisure reading, cooking   ADL   ADL comments Pt. is modified independent with all basic ADLs   IADL   Meal Prep Plans, prepares and serves adequate meals independently   Mobility   Mobility Status Independent  needs assist in unfamiliar environments   Vision - History   Visual History Macular degeneration  glaucoma   Patient Visual Report Central vision impairment;Peripheral vision impairment   Vision Assessment   Vision Assessment Vision tested   Per MD/OD Report OD NLP, OS 20/200   Reading Acuity 20/160   Patient has diffculty with activities due to visual impairment Adjusting stove/washing machine dials;Reading bills   Comment Pt reports she has been unable to read well for approx. 4 years                  OT Treatments/Exercises (OP) - 12/09/15 0001    ADLs   ADL Comments Pt was educated in use of a 3x stand magnifier with LED light. She demonstrates ability to read continuous text(Pt attempted use of 4x stand and handheld magnifiers as well but pt preferred 3x stand) Pt's son was provided with information for purchase. Therapist discussed importance of  contrast , good lighting and avoiding clutter on the floor for increased safety. Pt/ son verbalize understanding.               OT Education - 12/09/15 1643    Education provided Yes   Education Details eccentric viewing, info regarding macular degeneration/ glaucoma, use of hi marks on stove, use of 3x stand magnifier with LED   Person(s) Educated Patient;Child(ren)   Methods Explanation;Demonstration;Verbal cues   Comprehension Verbalized understanding;Returned demonstration                    Plan - 12/09/15 1636    Clinical Impression Statement Pt with macular degeneration and glaucoma presents with visual impairments which impede pt's reading ability and ADL/IADL performance. Pt can benefit from skilled occupational therapy to maximiize independence with reading and ADLs.   Rehab Potential Good   OT Frequency One time visit   OT Treatment/Interventions Self-care/ADL training;Patient/family education;Visual/perceptual remediation/compensation;DME and/or AE instruction   Plan Pt/ son were provided with education on day of evaluation and therefore no goals were set. Additional occupational therapy is not recommended at this time. Pt/ son were provided with info for purchase of a 3x stand magnifier.   Consulted and Agree with Plan of Care Patient;Family member/caregiver   Family Member Consulted son      Patient will benefit from skilled therapeutic intervention in order to improve the following deficits and impairments:  Impaired vision/preception  Visit Diagnosis: Scotoma involving central area, bilateral - Plan: Ot plan of care cert/re-cert    Problem List Patient Active Problem List   Diagnosis Date Noted  . Nausea with vomiting 07/21/2013  . Unspecified constipation 07/21/2013  . Abdominal pain, epigastric 07/21/2013  . Arthritis of knee 07/15/2013  . Arthritis of left knee 04/15/2013    RINE,KATHRYN 12/09/2015, 4:50 PM Keene Breath, OTR/L [phone] Phone: [phone] 4:50 PM 12/09/2015 [facility] [address] [place], [place], [address] Phone: [phone]   Fax:  [phone]  Name: Leslie Ball MRN: [id number] Date of Birth: 02-Nov-1940

## 2016-05-04 ENCOUNTER — Ambulatory Visit (INDEPENDENT_AMBULATORY_CARE_PROVIDER_SITE_OTHER): Payer: Medicaid Other

## 2016-05-04 ENCOUNTER — Ambulatory Visit (INDEPENDENT_AMBULATORY_CARE_PROVIDER_SITE_OTHER): Payer: Medicaid Other | Admitting: Orthopedic Surgery

## 2016-05-04 ENCOUNTER — Encounter (INDEPENDENT_AMBULATORY_CARE_PROVIDER_SITE_OTHER): Payer: Self-pay

## 2016-05-04 ENCOUNTER — Encounter (INDEPENDENT_AMBULATORY_CARE_PROVIDER_SITE_OTHER): Payer: Self-pay | Admitting: Orthopedic Surgery

## 2016-05-04 DIAGNOSIS — G8929 Other chronic pain: Secondary | ICD-10-CM | POA: Diagnosis not present

## 2016-05-04 DIAGNOSIS — M1610 Unilateral primary osteoarthritis, unspecified hip: Secondary | ICD-10-CM

## 2016-05-04 DIAGNOSIS — R1907 Generalized intra-abdominal and pelvic swelling, mass and lump: Secondary | ICD-10-CM | POA: Diagnosis not present

## 2016-05-04 DIAGNOSIS — M25562 Pain in left knee: Secondary | ICD-10-CM | POA: Diagnosis not present

## 2016-05-04 DIAGNOSIS — M479 Spondylosis, unspecified: Secondary | ICD-10-CM | POA: Diagnosis not present

## 2016-05-04 MED ORDER — ACETAMINOPHEN-CODEINE #3 300-30 MG PO TABS: 1.0000 | ORAL_TABLET | Freq: Four times a day (QID) | ORAL | 0 refills | Status: DC | PRN

## 2016-05-04 NOTE — Progress Notes (Signed)
Office Visit Note   Patient: Leslie Ball           Date of Birth: 08-01-1940           MRN: [id number] Visit Date: 05/04/2016 Requested by: Fleet Contras, MD [address] [place], [place] [address] PCP: Dorrene German, MD  Subjective: Chief Complaint  Patient presents with  . Left Knee - Pain    HPI patient is a 75 year old patient with 2-3 month history of left knee and leg pain.  She is really localizing most of the pain to the thigh.  Denies any right leg symptoms.  Is having some posterior buttock pain as well.  States that the pain comes and goes but it is definitely worse with ambulation.  She used to walk several times around the basketball court but is been unable to do so recently.  Son is with her he states that she slow down several months ago.  She denies any numbness and tingling in the leg and states that sitting is okay but when she tries to get up and around it is very painful.  She takes tramadol and meloxicam for the problem which helps some.  Denies any fever or chills.              Review of Systems All systems reviewed are negative as they relate to the chief complaint within the history of present illness.  Patient denies  fevers or chills.    Assessment & Plan: Visit Diagnoses:  1. Chronic pain of left knee   2. Hip arthritis   3. Arthritis of back   4. Generalized abdominal mass     Plan: Patient has multiple problems today.  Primary among them is left leg pain which could be hip arthritis related or could be back related.  She is having some radicular symptoms and signs as conveyed in translation through her son.  She has some groin pain with internal/external rotation of the left leg but not the right.  To sort this out I would like to do a diagnostic and therapeutic hip injection on the left with Dr. Alvester Morin and then order an MRI scan on the lumbar spine to rule out left-sided nerve compression.  In regards to the calcified abdominal mass within the  bony pelvis she needs CT scan with and without contrast to evaluate that with likely referral back to her primary care provider after that is done.  Follow-Up Instructions: Return for after MRI.   Orders:  Orders Placed This Encounter  Procedures  . XR KNEE 3 VIEW LEFT  . XR HIP UNILAT W OR W/O PELVIS 2-3 VIEWS LEFT  . XR Lumbar Spine Complete  . CT ABDOMEN PELVIS W WO CONTRAST  . Ambulatory referral to Physical Medicine Rehab   Meds ordered this encounter  Medications  . acetaminophen-codeine (TYLENOL #3) 300-30 MG tablet    Sig: Take 1 tablet by mouth every 6 (six) hours as needed for moderate pain.    Dispense:  30 tablet    Refill:  0      Procedures: No procedures performed   Clinical Data: No additional findings.  Objective: Vital Signs: There were no vitals taken for this visit.  Physical Exam   Constitutional: Patient appears well-developed HEENT:  Head: Normocephalic Eyes:EOM are normal Neck: Normal range of motion Cardiovascular: Normal rate Pulmonary/chest: Effort normal Neurologic: Patient is alert Skin: Skin is warm Psychiatric: Patient has normal mood and affect    Ortho Exam  patient has increased body mass index and Janel emergent gait to the left.  Pedal pulses palpable.  Knee range of motion is excellent with no effusion bilaterally.  No warmth to the knees.  Groin pain is present with internal/external rotation the left leg and less so with the right leg.  Patient has no nerve retention signs good ankle dorsiflexion plantar flexion quite hamstring strength.  Has reasonable hip flexion strength.  Specialty Comments:  No specialty comments available.  Imaging: Xr Hip Unilat W Or W/o Pelvis 2-3 Views Left  Result Date: 05/04/2016 AP pelvis lateral left hip reviewed.  End-stage arthritis is present in both hips worse on the left-hand side.  Calcific bony mass within the pelvis is noted.  Degenerative changes in the lower lumbar spine also seen.  SI  joints appear normal.  No other abnormalities within the bones of the pelvis.  Xr Lumbar Spine Complete  Result Date: 05/04/2016 AP lateral lumbar spine reviewed.  Again noted is a large calcific mass within the bony pelvis.  There is facet arthritis and mild scoliosis is present.  Degenerative disc disease is present.  No evidence of compression fracture.  Xr Knee 3 View Left  Result Date: 05/04/2016 AP lateral merchant left knee reviewed.  Stemmed tibial component in good position with no evidence of loosening.  Femoral component on the left also in good position with no evidence of loosening.  Patella is well centered in the trochlear groove on the merchant view.  No other bony abnormalities such as stress reaction stress fracture seen in the knee on the left    PMFS History: Patient Active Problem List   Diagnosis Date Noted  . Nausea with vomiting 07/21/2013  . Unspecified constipation 07/21/2013  . Abdominal pain, epigastric 07/21/2013  . Arthritis of knee 07/15/2013  . Arthritis of left knee 04/15/2013   Past Medical History:  Diagnosis Date  . Anemia   . Arthritis    "left knee; probably left shoulder & elbow" (07/15/2013)  . Asthma   . 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 Constipation    occasionally  . GERD (gastroesophageal reflux disease)    rare use of Pepto bismol  . Hypertension   . Neuromuscular disorder (HCC)    right hand has "pins and needles" feeling at times    No family history on file.  Past Surgical History:  Procedure Laterality Date  . CATARACT EXTRACTION W/ INTRAOCULAR LENS IMPLANT Bilateral   . EYE SURGERY Right   . JOINT REPLACEMENT    . REFRACTIVE SURGERY Bilateral   . RETINAL DETACHMENT SURGERY Right   . TOTAL KNEE ARTHROPLASTY Right 04/15/2013   Procedure: TOTAL KNEE ARTHROPLASTY;  Surgeon: Cammy Copa, MD;  Location: [facility];  Service: Orthopedics;   Laterality: Right;  . TOTAL KNEE ARTHROPLASTY Left 07/15/2013  . TOTAL KNEE ARTHROPLASTY Left 07/15/2013   Procedure: TOTAL KNEE ARTHROPLASTY- left;  Surgeon: Cammy Copa, MD;  Location: [facility] OR;  Service: Orthopedics;  Laterality: Left;   Social History   Occupational History  . Not on file.   Social History Main Topics  . Smoking status: Never Smoker  . Smokeless tobacco: Never Used  . Alcohol use No  . Drug use: No  . Sexual activity: Not on file

## 2016-05-09 NOTE — Addendum Note (Signed)
Addended by: Elvina Mattes T on: 05/09/2016 10:12 AM   Modules accepted: Orders

## 2016-05-11 ENCOUNTER — Ambulatory Visit (INDEPENDENT_AMBULATORY_CARE_PROVIDER_SITE_OTHER): Payer: Medicaid Other | Admitting: Physical Medicine and Rehabilitation

## 2016-05-11 ENCOUNTER — Encounter (INDEPENDENT_AMBULATORY_CARE_PROVIDER_SITE_OTHER): Payer: Self-pay | Admitting: Physical Medicine and Rehabilitation

## 2016-05-11 VITALS — BP 132/84

## 2016-05-11 DIAGNOSIS — M25552 Pain in left hip: Secondary | ICD-10-CM

## 2016-05-11 DIAGNOSIS — M1612 Unilateral primary osteoarthritis, left hip: Secondary | ICD-10-CM

## 2016-05-11 NOTE — Progress Notes (Signed)
Leslie Ball - 75 y.o. female MRN [id number]  Date of birth: 12-Apr-1941  Office Visit Note: Visit Date: 05/11/2016 PCP: Dorrene German, MD Referred by: Fleet Contras, MD  Subjective: Chief Complaint  Patient presents with  . Left Hip - Pain   HPI: Leslie Ball is a 75 year old female with end-stage bilateral hip arthritis and left hip pain for a couple of months and worse within the last 2 weeks. Pain radiating down leg to knee. She speaks some English and her son is here interpreting.    ROS Otherwise per HPI.  Assessment & Plan: Visit Diagnoses:  1. Pain in left hip   2. Unilateral primary osteoarthritis, left hip     Plan: Findings:  Left anesthetic and hopefully therapeutic hip arthrogram. As below she did get relief during the anesthetic phase of the injection. She is following up with Dr. August Saucer of her lumbar spine MRI.    Meds & Orders: No orders of the defined types were placed in this encounter.   Orders Placed This Encounter  Procedures  . Large Joint Injection/Arthrocentesis    Follow-up: Return for Physical follow-up with Dr. August Saucer.   Procedures: Hip anesthetic arthrogram Date/Time: 05/11/2016 3:05 PM Performed by: Tyrell Antonio Authorized by: Tyrell Antonio   Consent Given by:  Patient Site marked: the procedure site was marked   Timeout: prior to procedure the correct patient, procedure, and site was verified   Indications:  Pain and diagnostic evaluation Location:  Hip Site:  L hip joint Prep: patient was prepped and draped in usual sterile fashion   Needle Size:  22 G Needle Length:  3.5 inches Approach:  Anterior Ultrasound Guidance: No   Fluoroscopic Guidance: No   Arthrogram: Yes   Medications:  4 mL lidocaine 2 %; 80 mg triamcinolone acetonide 40 MG/ML Aspiration Attempted: Yes   Patient tolerance:  Patient tolerated the procedure well with no immediate complications  Arthrogram demonstrated excellent flow of contrast throughout the  joint surface without extravasation or obvious defect.  The patient had relief of symptoms during the anesthetic phase of the injection.     No notes on file   Clinical History: No specialty comments available.  She reports that she has never smoked. She has never used smokeless tobacco. No results for input(s): HGBA1C, LABURIC in the last 8760 hours.  Objective:  VS:  HT:    WT:   BMI:     BP:132/84  HR: bpm  TEMP: ( )  RESP:  Physical Exam  Musculoskeletal:  Concordant pain with hip rotation on the left    Ortho Exam Imaging: No results found.  Past Medical/Family/Surgical/Social History: Medications & Allergies reviewed per EMR Patient Active Problem List   Diagnosis Date Noted  . Nausea with vomiting 07/21/2013  . Unspecified constipation 07/21/2013  . Abdominal pain, epigastric 07/21/2013  . Arthritis of knee 07/15/2013  . Arthritis of left knee 04/15/2013   Past Medical History:  Diagnosis Date  . Anemia   . Arthritis    "left knee; probably left shoulder & elbow" (07/15/2013)  . Asthma   . 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 Constipation    occasionally  . GERD (gastroesophageal reflux disease)    rare use of Pepto bismol  . Hypertension   . Neuromuscular disorder (HCC)    right hand has "pins and needles" feeling at times  History reviewed. No pertinent family history. Past Surgical History:  Procedure Laterality Date  . CATARACT EXTRACTION W/ INTRAOCULAR LENS IMPLANT Bilateral   . EYE SURGERY Right   . JOINT REPLACEMENT    . REFRACTIVE SURGERY Bilateral   . RETINAL DETACHMENT SURGERY Right   . TOTAL KNEE ARTHROPLASTY Right 04/15/2013   Procedure: TOTAL KNEE ARTHROPLASTY;  Surgeon: Cammy Copa, MD;  Location: [facility] OR;  Service: Orthopedics;  Laterality: Right;  . TOTAL KNEE ARTHROPLASTY Left 07/15/2013  . TOTAL KNEE ARTHROPLASTY Left 07/15/2013   Procedure: TOTAL  KNEE ARTHROPLASTY- left;  Surgeon: Cammy Copa, MD;  Location: [facility] OR;  Service: Orthopedics;  Laterality: Left;   Social History   Occupational History  . Not on file.   Social History Main Topics  . Smoking status: Never Smoker  . Smokeless tobacco: Never Used  . Alcohol use No  . Drug use: No  . Sexual activity: Not on file

## 2016-05-11 NOTE — Patient Instructions (Signed)

## 2016-05-12 MED ORDER — LIDOCAINE HCL 2 % IJ SOLN
4.0000 mL | INTRAMUSCULAR | Status: AC | PRN
  Administered 2016-05-11: 4 mL

## 2016-05-12 MED ORDER — TRIAMCINOLONE ACETONIDE 40 MG/ML IJ SUSP
80.0000 mg | INTRAMUSCULAR | Status: AC | PRN
  Administered 2016-05-11: 80 mg via INTRA_ARTICULAR

## 2016-05-18 ENCOUNTER — Ambulatory Visit
Admission: RE | Admit: 2016-05-18 | Discharge: 2016-05-18 | Disposition: A | Discharge status: Home / Self Care | Payer: Medicaid Other | Source: Ambulatory Visit | Attending: Orthopedic Surgery | Admitting: Orthopedic Surgery

## 2016-05-18 DIAGNOSIS — R1907 Generalized intra-abdominal and pelvic swelling, mass and lump: Secondary | ICD-10-CM

## 2016-05-18 MED ORDER — IOPAMIDOL (ISOVUE-300) INJECTION 61%
100.0000 mL | Freq: Once | INTRAVENOUS | Status: AC | PRN
  Administered 2016-05-18: 100 mL via INTRAVENOUS

## 2016-06-08 ENCOUNTER — Ambulatory Visit (INDEPENDENT_AMBULATORY_CARE_PROVIDER_SITE_OTHER): Payer: Medicaid Other | Admitting: Orthopedic Surgery

## 2016-06-15 ENCOUNTER — Encounter (INDEPENDENT_AMBULATORY_CARE_PROVIDER_SITE_OTHER): Payer: Self-pay | Admitting: Orthopedic Surgery

## 2016-06-15 ENCOUNTER — Encounter (INDEPENDENT_AMBULATORY_CARE_PROVIDER_SITE_OTHER): Payer: Self-pay

## 2016-06-15 ENCOUNTER — Ambulatory Visit (INDEPENDENT_AMBULATORY_CARE_PROVIDER_SITE_OTHER): Payer: Medicaid Other | Admitting: Orthopedic Surgery

## 2016-06-15 DIAGNOSIS — M545 Low back pain: Secondary | ICD-10-CM

## 2016-06-15 DIAGNOSIS — M1712 Unilateral primary osteoarthritis, left knee: Secondary | ICD-10-CM | POA: Diagnosis not present

## 2016-06-15 DIAGNOSIS — G8929 Other chronic pain: Secondary | ICD-10-CM | POA: Diagnosis not present

## 2016-06-15 DIAGNOSIS — M1612 Unilateral primary osteoarthritis, left hip: Secondary | ICD-10-CM

## 2016-06-16 MED ORDER — ACETAMINOPHEN-CODEINE #3 300-30 MG PO TABS: 1.0000 | ORAL_TABLET | Freq: Four times a day (QID) | ORAL | 0 refills | Status: DC | PRN

## 2016-06-16 NOTE — Progress Notes (Signed)
Office Visit Note   Patient: Leslie Ball           Date of Birth: [birth year]           MRN: [id number] Visit Date: 06/15/2016 Requested by: Fleet Contras, MD [address] [place], [place] [address] PCP: Dorrene German, MD  Subjective: Chief Complaint  Patient presents with  . Left Hip - Pain, Follow-up  . Left Knee - Pain, Follow-up    HPI back and left leg pain patient is a 76 year old patient with back and left leg pain.  Inside [place] she's had a CT scan because of some calcification in the abdominal region.  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  She had a left hip joint injection 05/11/2016 which gave her 2 weeks of relief.  Now she is having increasing pain.  She is also reporting a clicking.  I examined that I think it is coming from the left hip and not the knee.  She is taking tramadol Tylenol and Robaxin.  I reviewed her lumbar MRI scan she has grade 1 spondylolisthesis.  She also has real significant hip arthritis left worse than right.              Review of Systems All systems reviewed are negative as they relate to the chief complaint within the history of present illness.  Patient denies  fevers or chills.    Assessment & Plan: Visit Diagnoses:  1. Arthritis of left knee   2. Primary osteoarthritis of left hip   3. Chronic low back pain, unspecified back pain laterality, with sciatica presence unspecified     Plan: Impression is left hip arthritis which I think is giving her most of her symptoms.  She may also have a component of back stenosis spondylolisthesis could be contributing.  We'll check MRI of L-spine to an epidural steroid injection and likely schedule her for hip replacement after those interventions.  I'm going to refill her Tylenol No. 3.  Follow-Up Instructions: Return for after MRI.   Orders:  Orders Placed This Encounter  Procedures  . MR Lumbar Spine w/o contrast   No orders of the defined types were placed in this  encounter.     Procedures: No procedures performed   Clinical Data: No additional findings.  Objective: Vital Signs: There were no vitals taken for this visit.  Physical Exam   Constitutional: Patient appears well-developed HEENT:  Head: Normocephalic Eyes:EOM are normal Neck: Normal range of motion Cardiovascular: Normal rate Pulmonary/chest: Effort normal Neurologic: Patient is alert Skin: Skin is warm Psychiatric: Patient has normal mood and affect    Ortho Exam patient does have hip arthritis and hip pain with internal/external rotation of the left leg.  Both knees have excellent range of motion full extension to the past 90 of flexion.  Collaterals are stable.  Pedal pulses palpable.  She does have an antalgic Trendelenburg type gait to the left.  No nerve retention signs noted.  No real definite paresthesias L1 S1 bilaterally.  Specialty Comments:  No specialty comments available.  Imaging: No results found.   PMFS History: Patient Active Problem List   Diagnosis Date Noted  . Primary osteoarthritis of left hip 06/15/2016  . Nausea with vomiting 07/21/2013  . Unspecified constipation 07/21/2013  . Abdominal pain, epigastric 07/21/2013  . Arthritis of knee 07/15/2013  . Arthritis of left knee 04/15/2013   Past Medical History:  Diagnosis Date  . Anemia   . Arthritis    "  left knee; probably left shoulder & elbow" (07/15/2013)  . Asthma   . 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 Constipation    occasionally  . GERD (gastroesophageal reflux disease)    rare use of Pepto bismol  . Hypertension   . Neuromuscular disorder (HCC)    right hand has "pins and needles" feeling at times    No family history on file.  Past Surgical History:  Procedure Laterality Date  . CATARACT EXTRACTION W/ INTRAOCULAR LENS IMPLANT Bilateral   . EYE SURGERY Right   . JOINT REPLACEMENT    .  REFRACTIVE SURGERY Bilateral   . RETINAL DETACHMENT SURGERY Right   . TOTAL KNEE ARTHROPLASTY Right 04/15/2013   Procedure: TOTAL KNEE ARTHROPLASTY;  Surgeon: Cammy Copa, MD;  Location: [facility] OR;  Service: Orthopedics;  Laterality: Right;  . TOTAL KNEE ARTHROPLASTY Left 07/15/2013  . TOTAL KNEE ARTHROPLASTY Left 07/15/2013   Procedure: TOTAL KNEE ARTHROPLASTY- left;  Surgeon: Cammy Copa, MD;  Location: [facility] OR;  Service: Orthopedics;  Laterality: Left;   Social History   Occupational History  . Not on file.   Social History Main Topics  . Smoking status: Never Smoker  . Smokeless tobacco: Never Used  . Alcohol use No  . Drug use: No  . Sexual activity: Not on file

## 2016-06-19 ENCOUNTER — Telehealth (INDEPENDENT_AMBULATORY_CARE_PROVIDER_SITE_OTHER): Payer: Self-pay | Admitting: Orthopedic Surgery

## 2016-06-19 DIAGNOSIS — M5441 Lumbago with sciatica, right side: Principal | ICD-10-CM

## 2016-06-19 DIAGNOSIS — G8929 Other chronic pain: Secondary | ICD-10-CM

## 2016-06-19 DIAGNOSIS — M5442 Lumbago with sciatica, left side: Principal | ICD-10-CM

## 2016-06-19 NOTE — Telephone Encounter (Signed)
Patient's son Sam calling to find out if the MRI is necessary for the purpose of locating where injections will be given by Dr. Alvester Morin. If yes, he is asking if he must bring his mother back in the office.  He states it is increasingly difficult to get her in and out of the car and would like not to put her through that unless truly necessary.  If the results do not require her coming in, are you able to advise and then proceed with scheduling injections?   Follow up to discuss MRI  has been made  Feb 2, one day after MRI.  Keep or Cancel follow up? Please advise.

## 2016-06-19 NOTE — Telephone Encounter (Signed)
Per conversation with Dr August Saucer patient does not need to return for post MRI appt with him.  Patient to see Dr Alvester Morin.  MRI follow up cancelled.  Sam (pt's son) is aware of cancelled appt.

## 2016-06-22 ENCOUNTER — Ambulatory Visit
Admission: RE | Admit: 2016-06-22 | Discharge: 2016-06-22 | Disposition: A | Discharge status: Home / Self Care | Payer: Medicaid Other | Source: Ambulatory Visit | Attending: Orthopedic Surgery | Admitting: Orthopedic Surgery

## 2016-06-22 ENCOUNTER — Other Ambulatory Visit (INDEPENDENT_AMBULATORY_CARE_PROVIDER_SITE_OTHER): Payer: Self-pay | Admitting: Orthopedic Surgery

## 2016-06-22 DIAGNOSIS — M545 Low back pain: Principal | ICD-10-CM

## 2016-06-22 DIAGNOSIS — Z77018 Contact with and (suspected) exposure to other hazardous metals: Secondary | ICD-10-CM

## 2016-06-22 DIAGNOSIS — G8929 Other chronic pain: Secondary | ICD-10-CM

## 2016-06-23 ENCOUNTER — Ambulatory Visit (INDEPENDENT_AMBULATORY_CARE_PROVIDER_SITE_OTHER): Payer: Medicaid Other | Admitting: Orthopedic Surgery

## 2016-06-26 ENCOUNTER — Telehealth (INDEPENDENT_AMBULATORY_CARE_PROVIDER_SITE_OTHER): Payer: Self-pay | Admitting: Orthopedic Surgery

## 2016-06-26 ENCOUNTER — Encounter (INDEPENDENT_AMBULATORY_CARE_PROVIDER_SITE_OTHER): Payer: Self-pay | Admitting: Orthopedic Surgery

## 2016-06-26 NOTE — Telephone Encounter (Signed)
Please call son and discuss MRI results, and advise on referral to [facility], thanks

## 2016-06-26 NOTE — Progress Notes (Signed)
I called Leslie Ball her son and discuss the MRI report.  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  However I think the majority of her symptoms are coming from her hip.  There was mention on the MRI scan about possible myeloma.  They recommended SPEP and UPEP.  I conveyed that information to Leslie Ball and he will arrange an appointment with Dr.Avebure to  look into that possibility.  If that is negative and if the back shot does not help majority of her symptoms which I expect will be the case then we can set her up for hip replacement at that time.

## 2016-06-26 NOTE — Telephone Encounter (Signed)
Sam (pt's son) calling regarding appt for injections with [place].  He states she is in tremendous pain.  She had the MRI Thursday and is awaiting an appt time to bring her in.  cb  [phone]

## 2016-06-26 NOTE — Telephone Encounter (Signed)
Faxed to Dr Concepcion Elk

## 2016-06-26 NOTE — Telephone Encounter (Signed)
I called Sam her son and discuss the MRI report.  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  However I think the majority of her symptoms are coming from her hip.  There was mention on the MRI scan about possible myeloma.  They recommended SPEP and UPEP.  I conveyed that information to Sam and he will arrange an appointment with Dr.Avebure to  look into that possibility.  If that is negative and if the back shot does not help majority of her symptoms which I expect will be the case then we can set her up for hip replacement at that time. This info sent to Dr Concepcion Elk

## 2016-07-03 ENCOUNTER — Encounter (INDEPENDENT_AMBULATORY_CARE_PROVIDER_SITE_OTHER): Payer: Medicaid Other | Admitting: Physical Medicine and Rehabilitation

## 2016-07-06 ENCOUNTER — Encounter (INDEPENDENT_AMBULATORY_CARE_PROVIDER_SITE_OTHER): Payer: Self-pay | Admitting: Physical Medicine and Rehabilitation

## 2016-07-06 ENCOUNTER — Ambulatory Visit (INDEPENDENT_AMBULATORY_CARE_PROVIDER_SITE_OTHER): Payer: Self-pay

## 2016-07-06 ENCOUNTER — Ambulatory Visit (INDEPENDENT_AMBULATORY_CARE_PROVIDER_SITE_OTHER): Payer: Medicaid Other | Admitting: Physical Medicine and Rehabilitation

## 2016-07-06 VITALS — BP 149/87 | Temp 98.2°F

## 2016-07-06 DIAGNOSIS — M5416 Radiculopathy, lumbar region: Secondary | ICD-10-CM

## 2016-07-06 MED ORDER — LIDOCAINE HCL (PF) 1 % IJ SOLN: 0.3300 mL | Freq: Once | INTRAMUSCULAR | Status: DC

## 2016-07-06 MED ORDER — METHYLPREDNISOLONE ACETATE 80 MG/ML IJ SUSP
80.0000 mg | Freq: Once | INTRAMUSCULAR | Status: AC
  Administered 2016-07-06: 80 mg

## 2016-07-06 NOTE — Patient Instructions (Signed)

## 2016-07-06 NOTE — Progress Notes (Signed)
Leslie Ball - 76 y.o. female MRN [id number]  Date of birth: November 29, 1940  Office Visit Note: Visit Date: 07/06/2016 PCP: Dorrene German, MD Referred by: Fleet Contras, MD  Subjective: Chief Complaint  Patient presents with  . Lower Back - Pain   HPI: Leslie Ball is a pleasant 76 year old female patient of Dr. Diamantina Providence. She is present with her son who provides translation and interpretation. He states that she did fairly well with hip injection that lasted for about a week and the pain came back. She states it was pretty decent relief during that time. She does have fairly severe arthritis of the hip. She also has severe arthritis of the lower spine particularly at L4-5. There is no focal nerve compression or central stenosis but there is quite a bit of facet arthropathy and some lateral recess narrowing. Dr. August Saucer requests a diagnostic epidural injection to see how much of this pain radiating into the left hip might be from the spine. We explained this to the patient and they do want to proceed with that today.   left side lower back pain into hip. Son states she did ok with last injection for around 1 week and then pain right back the way it was.   ROS Otherwise per HPI.  Assessment & Plan: Visit Diagnoses:  1. Lumbar radiculopathy     Plan: Findings:  Left L4-5 intralaminar epidural steroid injection.    Meds & Orders:  Meds ordered this encounter  Medications  . methylPREDNISolone acetate (DEPO-MEDROL) injection 80 mg  . lidocaine (PF) (XYLOCAINE) 1 % injection 0.3 mL    Orders Placed This Encounter  Procedures  . XR C-ARM NO REPORT  . Epidural Steroid injection    Follow-up: Return for scheduled follow up with Dr. August Saucer.   Procedures: No procedures performed  Lumbar Epidural Steroid Injection - Interlaminar Approach with Fluoroscopic Guidance  Patient: Leslie Ball      Date of Birth: 07/08/1940 MRN: [id number] PCP: Dorrene German, MD      Visit Date:  07/06/2016   Universal Protocol:    Date/Time: 02/16/182:48 PM  Consent Given By: the patient  Position: PRONE  Additional Comments: Vital signs were monitored before and after the procedure. Patient was prepped and draped in the usual sterile fashion. The correct patient, procedure, and site was verified.   Injection Procedure Details:  Procedure Site One Meds Administered:  Meds ordered this encounter  Medications  . methylPREDNISolone acetate (DEPO-MEDROL) injection 80 mg  . lidocaine (PF) (XYLOCAINE) 1 % injection 0.3 mL     Laterality: Left  Location/Site:  L4-L5  Needle size: 20 G  Needle type: Tuohy  Needle Placement: Paramedian epidural  Findings:  -Contrast Used: 1 mL iohexol 180 mg iodine/mL   -Comments: Excellent flow of contrast into the epidural space.  Procedure Details: Using a paramedian approach from the side mentioned above, the region overlying the inferior lamina was localized under fluoroscopic visualization and the soft tissues overlying this structure were infiltrated with 4 ml. of 1% Lidocaine without Epinephrine. The Tuohy needle was inserted into the epidural space using a paramedian approach.   The epidural space was localized using loss of resistance along with lateral and bi-planar fluoroscopic views.  After negative aspirate for air, blood, and CSF, a 2 ml. volume of Isovue-250 was injected into the epidural space and the flow of contrast was observed. Radiographs were obtained for documentation purposes.    The injectate was administered into the level  noted above.   Additional Comments:  The patient tolerated the procedure well No complications occurred Dressing: Band-Aid    Post-procedure details: Patient was observed during the procedure. Post-procedure instructions were reviewed.  Patient left the clinic in stable condition.    Clinical History: No specialty comments available.  She reports that she has never smoked. She  has never used smokeless tobacco. No results for input(s): HGBA1C, LABURIC in the last 8760 hours.  Objective:  VS:  HT:    WT:   BMI:     BP:(!) 149/87  HR: bpm  TEMP:98.2 F (36.8 C)( )  RESP:98 % Physical Exam  Musculoskeletal:  Patient has significant painful weightbearing on the left hip and painful range of motion. She ambulates fairly slowly with a wide-based gait and is actually fairly frail. She has good distal strength but with bilateral edema.    Ortho Exam Imaging: Xr C-arm No Report  Result Date: 07/06/2016 Please see Notes or Procedures tab for imaging impression.   Past Medical/Family/Surgical/Social History: Medications & Allergies reviewed per EMR Patient Active Problem List   Diagnosis Date Noted  . Primary osteoarthritis of left hip 06/15/2016  . Nausea with vomiting 07/21/2013  . Unspecified constipation 07/21/2013  . Abdominal pain, epigastric 07/21/2013  . Arthritis of knee 07/15/2013  . Arthritis of left knee 04/15/2013   Past Medical History:  Diagnosis Date  . Anemia   . Arthritis    "left knee; probably left shoulder & elbow" (07/15/2013)  . Asthma   . 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 Constipation    occasionally  . GERD (gastroesophageal reflux disease)    rare use of Pepto bismol  . Hypertension   . Neuromuscular disorder (HCC)    right hand has "pins and needles" feeling at times   History reviewed. No pertinent family history. Past Surgical History:  Procedure Laterality Date  . CATARACT EXTRACTION W/ INTRAOCULAR LENS IMPLANT Bilateral   . EYE SURGERY Right   . JOINT REPLACEMENT    . REFRACTIVE SURGERY Bilateral   . RETINAL DETACHMENT SURGERY Right   . TOTAL KNEE ARTHROPLASTY Right 04/15/2013   Procedure: TOTAL KNEE ARTHROPLASTY;  Surgeon: Cammy Copa, MD;  Location: [facility] OR;  Service: Orthopedics;  Laterality: Right;  . TOTAL KNEE ARTHROPLASTY  Left 07/15/2013  . TOTAL KNEE ARTHROPLASTY Left 07/15/2013   Procedure: TOTAL KNEE ARTHROPLASTY- left;  Surgeon: Cammy Copa, MD;  Location: [facility] OR;  Service: Orthopedics;  Laterality: Left;   Social History   Occupational History  . Not on file.   Social History Main Topics  . Smoking status: Never Smoker  . Smokeless tobacco: Never Used  . Alcohol use No  . Drug use: No  . Sexual activity: Not on file

## 2016-07-07 NOTE — Procedures (Signed)
Lumbar Epidural Steroid Injection - Interlaminar Approach with Fluoroscopic Guidance  Patient: Leslie Ball      Date of Birth: Jun 20, 1940 MRN: [id number] PCP: Dorrene German, MD      Visit Date: 07/06/2016   Universal Protocol:    Date/Time: 02/16/182:48 PM  Consent Given By: the patient  Position: PRONE  Additional Comments: Vital signs were monitored before and after the procedure. Patient was prepped and draped in the usual sterile fashion. The correct patient, procedure, and site was verified.   Injection Procedure Details:  Procedure Site One Meds Administered:  Meds ordered this encounter  Medications  . methylPREDNISolone acetate (DEPO-MEDROL) injection 80 mg  . lidocaine (PF) (XYLOCAINE) 1 % injection 0.3 mL     Laterality: Left  Location/Site:  L4-L5  Needle size: 20 G  Needle type: Tuohy  Needle Placement: Paramedian epidural  Findings:  -Contrast Used: 1 mL iohexol 180 mg iodine/mL   -Comments: Excellent flow of contrast into the epidural space.  Procedure Details: Using a paramedian approach from the side mentioned above, the region overlying the inferior lamina was localized under fluoroscopic visualization and the soft tissues overlying this structure were infiltrated with 4 ml. of 1% Lidocaine without Epinephrine. The Tuohy needle was inserted into the epidural space using a paramedian approach.   The epidural space was localized using loss of resistance along with lateral and bi-planar fluoroscopic views.  After negative aspirate for air, blood, and CSF, a 2 ml. volume of Isovue-250 was injected into the epidural space and the flow of contrast was observed. Radiographs were obtained for documentation purposes.    The injectate was administered into the level noted above.   Additional Comments:  The patient tolerated the procedure well No complications occurred Dressing: Band-Aid    Post-procedure details: Patient was observed during the  procedure. Post-procedure instructions were reviewed.  Patient left the clinic in stable condition.

## 2016-07-12 ENCOUNTER — Ambulatory Visit (INDEPENDENT_AMBULATORY_CARE_PROVIDER_SITE_OTHER): Payer: Medicaid Other | Admitting: Orthopedic Surgery

## 2016-07-20 ENCOUNTER — Ambulatory Visit (INDEPENDENT_AMBULATORY_CARE_PROVIDER_SITE_OTHER): Payer: Medicaid Other | Admitting: Orthopedic Surgery

## 2016-07-20 ENCOUNTER — Ambulatory Visit (INDEPENDENT_AMBULATORY_CARE_PROVIDER_SITE_OTHER): Payer: Self-pay | Admitting: Orthopedic Surgery

## 2016-07-24 ENCOUNTER — Ambulatory Visit (INDEPENDENT_AMBULATORY_CARE_PROVIDER_SITE_OTHER): Payer: Medicaid Other | Admitting: Orthopedic Surgery

## 2016-07-24 ENCOUNTER — Other Ambulatory Visit (INDEPENDENT_AMBULATORY_CARE_PROVIDER_SITE_OTHER): Payer: Self-pay | Admitting: Orthopedic Surgery

## 2016-07-24 ENCOUNTER — Encounter (INDEPENDENT_AMBULATORY_CARE_PROVIDER_SITE_OTHER): Payer: Self-pay | Admitting: Orthopedic Surgery

## 2016-07-24 DIAGNOSIS — M1612 Unilateral primary osteoarthritis, left hip: Secondary | ICD-10-CM

## 2016-07-24 DIAGNOSIS — M5442 Lumbago with sciatica, left side: Secondary | ICD-10-CM | POA: Diagnosis not present

## 2016-07-24 DIAGNOSIS — G8929 Other chronic pain: Secondary | ICD-10-CM

## 2016-07-26 NOTE — Progress Notes (Signed)
Office Visit Note   Patient: Leslie Ball           Date of Birth: September 11, 1940           MRN: [id number] Visit Date: 07/24/2016 Requested by: Fleet Contras, MD [address] [place], [place] [address] PCP: Dorrene German, MD  Subjective: Chief Complaint  Patient presents with  . Lower Back - Pain    HPI: Gift is a 76 year old patient here with her son Sam to review results of hip injections and back injections.  She's taking tramadol for her symptoms.  She does have an overactive bladder.  In general she reports significantly  decreased walking ability over the past 2 months.  She localizes pain to the groin and the posterior buttock which runs down the leg but not below the knee.  There is no numbness associated with this.  She had about 50% relief with hip injection in December and about 50% relief of her pain with a back injection of this month.  Start for her to sit and difficult for her to stand and walk.  She has occasional right leg symptoms.  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  She does have a past medical history significant for asthma.  No family history of DVT or pulmonary embolism.              ROS: All systems reviewed are negative as they relate to the chief complaint within the history of present illness.  Patient denies  fevers or chills.   Assessment & Plan: Visit Diagnoses:  1. Chronic left-sided low back pain with left-sided sciatica     Plan: Impression is left hip arthritis.  I think that based on her exam and response to injections absence of paresthesias and absence of symptoms below the knee that is most likely most of her symptoms are coming from her hip.  She has end-stage hip arthritis and a lot of groin pain with internal/external rotation of that left leg.  We discussed operative and nonoperative options and she and her son are very insistent upon hip replacement which I think will be her best option.  The risks and benefits  of that hip replacement are discussed including not limited to infection or vessel damage dislocation leg length inequality.  Patient understands risks and benefits and wishes to proceed.  All questions answered  Follow-Up Instructions: No Follow-up on file.   Orders:  No orders of the defined types were placed in this encounter.  No orders of the defined types were placed in this encounter.     Procedures: No procedures performed   Clinical Data: No additional findings.  Objective: Vital Signs: There were no vitals taken for this visit.  Physical Exam:   Constitutional: Patient appears well-developed HEENT:  Head: Normocephalic Eyes:EOM are normal Neck: Normal range of motion Cardiovascular: Normal rate Pulmonary/chest: Effort normal Neurologic: Patient is alert Skin: Skin is warm Psychiatric: Patient has normal mood and affect    Ortho Exam: Orthopedic exam demonstrates groin pain with internal/external rotation of the left leg but not the right.  No nerve retention signs.  Palpable pedal pulses.  She has good ankle dorsi flexion plantar flexion strength in approximately equal leg lengths.  No other masses lymph adenopathy or skin changes noted in the left hip region.  No pain with forward lateral bending.  Specialty Comments:  No specialty comments available.  Imaging: No results found.   PMFS History:  Patient Active Problem List   Diagnosis Date Noted  . Primary osteoarthritis of left hip 06/15/2016  . Nausea with vomiting 07/21/2013  . Unspecified constipation 07/21/2013  . Abdominal pain, epigastric 07/21/2013  . Arthritis of knee 07/15/2013  . Arthritis of left knee 04/15/2013   Past Medical History:  Diagnosis Date  . Anemia   . Arthritis    "left knee; probably left shoulder & elbow" (07/15/2013)  . Asthma   . 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 Constipation     occasionally  . GERD (gastroesophageal reflux disease)    rare use of Pepto bismol  . Hypertension   . Neuromuscular disorder (HCC)    right hand has "pins and needles" feeling at times    No family history on file.  Past Surgical History:  Procedure Laterality Date  . CATARACT EXTRACTION W/ INTRAOCULAR LENS IMPLANT Bilateral   . EYE SURGERY Right   . JOINT REPLACEMENT    . REFRACTIVE SURGERY Bilateral   . RETINAL DETACHMENT SURGERY Right   . TOTAL KNEE ARTHROPLASTY Right 04/15/2013   Procedure: TOTAL KNEE ARTHROPLASTY;  Surgeon: Cammy Copa, MD;  Location: [facility] OR;  Service: Orthopedics;  Laterality: Right;  . TOTAL KNEE ARTHROPLASTY Left 07/15/2013  . TOTAL KNEE ARTHROPLASTY Left 07/15/2013   Procedure: TOTAL KNEE ARTHROPLASTY- left;  Surgeon: Cammy Copa, MD;  Location: [facility] OR;  Service: Orthopedics;  Laterality: Left;   Social History   Occupational History  . Not on file.   Social History Main Topics  . Smoking status: Never Smoker  . Smokeless tobacco: Never Used  . Alcohol use No  . Drug use: No  . Sexual activity: Not on file

## 2016-07-26 NOTE — Pre-Procedure Instructions (Signed)
Leslie Ball  07/26/2016      [facility] - [place], [place] - [address] AT [facility] [address] [facility] [address] [place] [address] Phone: [phone] Fax: [phone]  [facility] - [place], [place] - [address] AT [facility] OF [facility] & [address] [place] [address] Phone: [phone] Fax: [phone]    Your procedure is scheduled on March 9  Report to [facility] Admitting at 0530 A.M.  Call this number if you have problems the morning of surgery:  [phone]   Remember:  Do not eat food or drink liquids after midnight.   Take these medicines the morning of surgery with A SIP OF WATER eye drops, Fluticasone-Salmeterol (ADVAIR), metoprolol succinate (TOPROL-XL)  7 days prior to surgery STOP taking any MOBIC Aspirin, Aleve, Naproxen, Ibuprofen, Motrin, Advil, Goody's, BC's, all herbal medications, fish oil, and all vitamins    Do not wear jewelry, make-up or nail polish.  Do not wear lotions, powders, or perfumes, or deoderant.  Do not shave 48 hours prior to surgery.  Men may shave face and neck.  Do not bring valuables to the hospital.  [facility] is not responsible for any belongings or valuables.  Contacts, dentures or bridgework may not be worn into surgery.  Leave your suitcase in the car.  After surgery it may be brought to your room.  For patients admitted to the hospital, discharge time will be determined by your treatment team.  Patients discharged the day of surgery will not be allowed to drive home.    Special instructions:   [facility]- Preparing For Surgery  Before surgery, you can play an important role. Because skin is not sterile, your skin needs to be as free of germs as possible. You can reduce the number of germs on your skin by washing with CHG (chlorahexidine gluconate) Soap before surgery.  CHG is an antiseptic cleaner which kills germs and bonds with the  skin to continue killing germs even after washing.  Please do not use if you have an allergy to CHG or antibacterial soaps. If your skin becomes reddened/irritated stop using the CHG.  Do not shave (including legs and underarms) for at least 48 hours prior to first CHG shower. It is OK to shave your face.  Please follow these instructions carefully.   1. Shower the NIGHT BEFORE SURGERY and the MORNING OF SURGERY with CHG.   2. If you chose to wash your hair, wash your hair first as usual with your normal shampoo.  3. After you shampoo, rinse your hair and body thoroughly to remove the shampoo.  4. Use CHG as you would any other liquid soap. You can apply CHG directly to the skin and wash gently with a scrungie or a clean washcloth.   5. Apply the CHG Soap to your body ONLY FROM THE NECK DOWN.  Do not use on open wounds or open sores. Avoid contact with your eyes, ears, mouth and genitals (private parts). Wash genitals (private parts) with your normal soap.  6. Wash thoroughly, paying special attention to the area where your surgery will be performed.  7. Thoroughly rinse your body with warm water from the neck down.  8. DO NOT shower/wash with your normal soap after using and rinsing off the CHG Soap.  9. Pat yourself dry with a CLEAN TOWEL.   10. Wear CLEAN PAJAMAS  11. Place CLEAN SHEETS on your bed the night of your first shower and DO NOT SLEEP WITH PETS.    Day of Surgery: Do not apply any deodorants/lotions. Please wear clean clothes to the hospital/surgery center.      Please read over the following fact sheets that you were given.

## 2016-07-27 ENCOUNTER — Encounter (HOSPITAL_COMMUNITY): Payer: Self-pay

## 2016-07-27 ENCOUNTER — Encounter (HOSPITAL_COMMUNITY)
Admission: RE | Admit: 2016-07-27 | Discharge: 2016-07-27 | Disposition: A | Discharge status: Home / Self Care | Payer: Medicaid Other | Source: Ambulatory Visit | Attending: Orthopedic Surgery | Admitting: Orthopedic Surgery

## 2016-07-27 DIAGNOSIS — I10 Essential (primary) hypertension: Secondary | ICD-10-CM

## 2016-07-27 DIAGNOSIS — M1612 Unilateral primary osteoarthritis, left hip: Secondary | ICD-10-CM | POA: Insufficient documentation

## 2016-07-27 DIAGNOSIS — R9431 Abnormal electrocardiogram [ECG] [EKG]: Secondary | ICD-10-CM

## 2016-07-27 DIAGNOSIS — R1013 Epigastric pain: Secondary | ICD-10-CM | POA: Insufficient documentation

## 2016-07-27 DIAGNOSIS — Z01810 Encounter for preprocedural cardiovascular examination: Secondary | ICD-10-CM | POA: Insufficient documentation

## 2016-07-27 DIAGNOSIS — M1712 Unilateral primary osteoarthritis, left knee: Secondary | ICD-10-CM

## 2016-07-27 DIAGNOSIS — M069 Rheumatoid arthritis, unspecified: Secondary | ICD-10-CM

## 2016-07-27 DIAGNOSIS — Z01812 Encounter for preprocedural laboratory examination: Secondary | ICD-10-CM | POA: Insufficient documentation

## 2016-07-27 HISTORY — DX: Rheumatoid arthritis, unspecified: M06.9

## 2016-07-27 LAB — BASIC METABOLIC PANEL
Anion gap: 11 (ref 5–15)
BUN: 23 mg/dL — ABNORMAL HIGH (ref 6–20)
CHLORIDE: 100 mmol/L — AB (ref 101–111)
CO2: 25 mmol/L (ref 22–32)
Calcium: 9 mg/dL (ref 8.9–10.3)
Creatinine, Ser: 0.89 mg/dL (ref 0.44–1.00)
Glucose, Bld: 107 mg/dL — ABNORMAL HIGH (ref 65–99)
POTASSIUM: 3.6 mmol/L (ref 3.5–5.1)
Sodium: 136 mmol/L (ref 135–145)

## 2016-07-27 LAB — CBC
HCT: 36.3 % (ref 36.0–46.0)
Hemoglobin: 11.4 g/dL — ABNORMAL LOW (ref 12.0–15.0)
MCH: 25.3 pg — ABNORMAL LOW (ref 26.0–34.0)
MCHC: 31.4 g/dL (ref 30.0–36.0)
MCV: 80.5 fL (ref 78.0–100.0)
PLATELETS: 430 10*3/uL — AB (ref 150–400)
RBC: 4.51 MIL/uL (ref 3.87–5.11)
RDW: 18.2 % — AB (ref 11.5–15.5)
WBC: 5.9 10*3/uL (ref 4.0–10.5)

## 2016-07-27 LAB — SURGICAL PCR SCREEN
MRSA, PCR: NEGATIVE
STAPHYLOCOCCUS AUREUS: NEGATIVE

## 2016-07-27 NOTE — Progress Notes (Signed)
Patient is allergic to he CHG instructed patietn to bathe with mild soap the night and more of surgery

## 2016-07-27 NOTE — Progress Notes (Signed)
PCP - Luci Bank Cardiologist - denies  Chest x-ray - denies EKG - 07/27/16 Stress Test denies-  ECHO - denies Cardiac Cath - denies  Patient verbalized understanding of instructions with help of an interpreter     Patient denies shortness of breath, fever, cough and chest pain at PAT appointment   Patient verbalized understanding of instructions that was given to them at the PAT appointment. Patient expressed that there were no further questions.  Patient was also instructed that they will need to review over the PAT instructions again at home before the surgery.

## 2016-07-27 NOTE — Anesthesia Preprocedure Evaluation (Addendum)
Anesthesia Evaluation  Patient identified by MRN, date of birth, ID band Patient awake    Reviewed: Allergy & Precautions, NPO status , Patient's Chart, lab work & pertinent test results, reviewed documented beta blocker date and time   Airway Mallampati: II  TM Distance: >3 FB Neck ROM: Full    Dental  (+) Dental Advisory Given   Pulmonary asthma ,    breath sounds clear to auscultation       Cardiovascular hypertension, Pt. on medications and Pt. on home beta blockers  Rhythm:Regular Rate:Normal     Neuro/Psych negative neurological ROS     GI/Hepatic Neg liver ROS, GERD  ,  Endo/Other  negative endocrine ROS  Renal/GU negative Renal ROS     Musculoskeletal  (+) Arthritis ,   Abdominal   Peds  Hematology  (+) anemia ,   Anesthesia Other Findings   Reproductive/Obstetrics                            Lab Results  Component Value Date   WBC 5.9 07/27/2016   HGB 11.4 (L) 07/27/2016   HCT 36.3 07/27/2016   MCV 80.5 07/27/2016   PLT 430 (H) 07/27/2016   Lab Results  Component Value Date   CREATININE 0.89 07/27/2016   BUN 23 (H) 07/27/2016   NA 136 07/27/2016   K 3.6 07/27/2016   CL 100 (L) 07/27/2016   CO2 25 07/27/2016    Anesthesia Physical Anesthesia Plan  ASA: II  Anesthesia Plan: General   Post-op Pain Management:    Induction: Intravenous  Airway Management Planned: Oral ETT  Additional Equipment:   Intra-op Plan:   Post-operative Plan: Extubation in OR  Informed Consent: I have reviewed the patients History and Physical, chart, labs and discussed the procedure including the risks, benefits and alternatives for the proposed anesthesia with the patient or authorized representative who has indicated his/her understanding and acceptance.   Dental advisory given  Plan Discussed with:   Anesthesia Plan Comments:        Anesthesia Quick Evaluation

## 2016-07-28 ENCOUNTER — Inpatient Hospital Stay (HOSPITAL_COMMUNITY)
Admission: RE | Admit: 2016-07-28 | Discharge: 2016-08-01 | DRG: 470 | Disposition: A | Discharge status: Home Health | Payer: Medicaid Other | Source: Ambulatory Visit | Attending: Orthopedic Surgery | Admitting: Orthopedic Surgery

## 2016-07-28 ENCOUNTER — Inpatient Hospital Stay (HOSPITAL_COMMUNITY): Payer: Medicaid Other | Admitting: Anesthesiology

## 2016-07-28 ENCOUNTER — Inpatient Hospital Stay (HOSPITAL_COMMUNITY): Payer: Medicaid Other

## 2016-07-28 ENCOUNTER — Encounter (HOSPITAL_COMMUNITY)
Admission: RE | Disposition: A | Discharge status: Home Health | Payer: Self-pay | Source: Ambulatory Visit | Attending: Orthopedic Surgery

## 2016-07-28 DIAGNOSIS — D649 Anemia, unspecified: Secondary | ICD-10-CM | POA: Diagnosis present

## 2016-07-28 DIAGNOSIS — I10 Essential (primary) hypertension: Secondary | ICD-10-CM | POA: Diagnosis present

## 2016-07-28 DIAGNOSIS — D62 Acute posthemorrhagic anemia: Secondary | ICD-10-CM | POA: Diagnosis not present

## 2016-07-28 DIAGNOSIS — H409 Unspecified glaucoma: Secondary | ICD-10-CM | POA: Diagnosis present

## 2016-07-28 DIAGNOSIS — Z9842 Cataract extraction status, left eye: Secondary | ICD-10-CM | POA: Diagnosis not present

## 2016-07-28 DIAGNOSIS — Z79899 Other long term (current) drug therapy: Secondary | ICD-10-CM

## 2016-07-28 DIAGNOSIS — Z419 Encounter for procedure for purposes other than remedying health state, unspecified: Secondary | ICD-10-CM

## 2016-07-28 DIAGNOSIS — Z9841 Cataract extraction status, right eye: Secondary | ICD-10-CM | POA: Diagnosis not present

## 2016-07-28 DIAGNOSIS — M1612 Unilateral primary osteoarthritis, left hip: Principal | ICD-10-CM | POA: Diagnosis present

## 2016-07-28 DIAGNOSIS — K219 Gastro-esophageal reflux disease without esophagitis: Secondary | ICD-10-CM | POA: Diagnosis present

## 2016-07-28 DIAGNOSIS — Z7951 Long term (current) use of inhaled steroids: Secondary | ICD-10-CM | POA: Diagnosis not present

## 2016-07-28 DIAGNOSIS — J45909 Unspecified asthma, uncomplicated: Secondary | ICD-10-CM | POA: Diagnosis present

## 2016-07-28 DIAGNOSIS — Z96653 Presence of artificial knee joint, bilateral: Secondary | ICD-10-CM | POA: Diagnosis present

## 2016-07-28 DIAGNOSIS — Z9109 Other allergy status, other than to drugs and biological substances: Secondary | ICD-10-CM | POA: Diagnosis not present

## 2016-07-28 DIAGNOSIS — M1610 Unilateral primary osteoarthritis, unspecified hip: Secondary | ICD-10-CM | POA: Diagnosis present

## 2016-07-28 DIAGNOSIS — Z961 Presence of intraocular lens: Secondary | ICD-10-CM | POA: Diagnosis present

## 2016-07-28 DIAGNOSIS — Z9889 Other specified postprocedural states: Secondary | ICD-10-CM

## 2016-07-28 HISTORY — PX: TOTAL KNEE ARTHROPLASTY: SHX125

## 2016-07-28 HISTORY — PX: TOTAL HIP ARTHROPLASTY: SHX124

## 2016-07-28 LAB — URINALYSIS, ROUTINE W REFLEX MICROSCOPIC
BILIRUBIN URINE: NEGATIVE
Glucose, UA: NEGATIVE mg/dL
KETONES UR: NEGATIVE mg/dL
LEUKOCYTES UA: NEGATIVE
NITRITE: NEGATIVE
PROTEIN: NEGATIVE mg/dL
Specific Gravity, Urine: 1.019 (ref 1.005–1.030)
pH: 6 (ref 5.0–8.0)

## 2016-07-28 SURGERY — ARTHROPLASTY, HIP, TOTAL,POSTERIOR APPROACH
Anesthesia: General | Site: Hip | Laterality: Left

## 2016-07-28 MED ORDER — BUPIVACAINE HCL (PF) 0.25 % IJ SOLN
INTRAMUSCULAR | Status: AC
  Filled 2016-07-28: qty 30

## 2016-07-28 MED ORDER — HYDROCHLOROTHIAZIDE 12.5 MG PO CAPS
12.5000 mg | ORAL_CAPSULE | Freq: Every day | ORAL | Status: DC
  Filled 2016-07-28: qty 1

## 2016-07-28 MED ORDER — CEFAZOLIN SODIUM-DEXTROSE 2-4 GM/100ML-% IV SOLN
INTRAVENOUS | Status: AC
  Filled 2016-07-28: qty 100

## 2016-07-28 MED ORDER — FENTANYL CITRATE (PF) 100 MCG/2ML IJ SOLN
INTRAMUSCULAR | Status: AC
  Filled 2016-07-28: qty 2

## 2016-07-28 MED ORDER — ROCURONIUM BROMIDE 50 MG/5ML IV SOSY
PREFILLED_SYRINGE | INTRAVENOUS | Status: AC
  Filled 2016-07-28: qty 5

## 2016-07-28 MED ORDER — LATANOPROST 0.005 % OP SOLN
1.0000 [drp] | Freq: Every day | OPHTHALMIC | Status: DC
Start: 2016-07-28 — End: 2016-08-01
  Administered 2016-07-28 – 2016-07-31 (×5): 1 [drp] via OPHTHALMIC
  Filled 2016-07-28: qty 2.5

## 2016-07-28 MED ORDER — LIDOCAINE 2% (20 MG/ML) 5 ML SYRINGE
INTRAMUSCULAR | Status: AC
  Filled 2016-07-28: qty 5

## 2016-07-28 MED ORDER — FENTANYL CITRATE (PF) 100 MCG/2ML IJ SOLN
INTRAMUSCULAR | Status: DC | PRN
  Administered 2016-07-28 (×3): 25 ug via INTRAVENOUS
  Administered 2016-07-28: 50 ug via INTRAVENOUS
  Administered 2016-07-28: 100 ug via INTRAVENOUS
  Administered 2016-07-28: 25 ug via INTRAVENOUS
  Administered 2016-07-28 (×2): 50 ug via INTRAVENOUS
  Administered 2016-07-28 (×2): 25 ug via INTRAVENOUS

## 2016-07-28 MED ORDER — CEFAZOLIN SODIUM-DEXTROSE 2-4 GM/100ML-% IV SOLN
2.0000 g | INTRAVENOUS | Status: AC
  Administered 2016-07-28: 2 g via INTRAVENOUS

## 2016-07-28 MED ORDER — GLYCOPYRROLATE 0.2 MG/ML IJ SOLN
INTRAMUSCULAR | Status: DC | PRN
  Administered 2016-07-28 (×2): 0.1 mg via INTRAVENOUS

## 2016-07-28 MED ORDER — CEFAZOLIN SODIUM-DEXTROSE 2-4 GM/100ML-% IV SOLN
2.0000 g | Freq: Four times a day (QID) | INTRAVENOUS | Status: AC
  Administered 2016-07-28 (×2): 2 g via INTRAVENOUS
  Filled 2016-07-28 (×2): qty 100

## 2016-07-28 MED ORDER — PROPOFOL 10 MG/ML IV BOLUS
INTRAVENOUS | Status: AC
  Filled 2016-07-28: qty 20

## 2016-07-28 MED ORDER — ONDANSETRON HCL 4 MG/2ML IJ SOLN: 4.0000 mg | Freq: Four times a day (QID) | INTRAMUSCULAR | Status: DC | PRN

## 2016-07-28 MED ORDER — PROPOFOL 1000 MG/100ML IV EMUL
INTRAVENOUS | Status: AC
  Filled 2016-07-28: qty 200

## 2016-07-28 MED ORDER — ONDANSETRON HCL 4 MG/2ML IJ SOLN
INTRAMUSCULAR | Status: DC | PRN
  Administered 2016-07-28: 4 mg via INTRAVENOUS

## 2016-07-28 MED ORDER — ACETAMINOPHEN 650 MG RE SUPP: 650.0000 mg | Freq: Four times a day (QID) | RECTAL | Status: DC | PRN

## 2016-07-28 MED ORDER — HYDROMORPHONE HCL 1 MG/ML IJ SOLN
INTRAMUSCULAR | Status: AC
  Administered 2016-07-28: 0.5 mg via INTRAVENOUS
  Filled 2016-07-28: qty 0.5

## 2016-07-28 MED ORDER — PROPOFOL 10 MG/ML IV BOLUS
INTRAVENOUS | Status: DC | PRN
  Administered 2016-07-28 (×2): 10 mg via INTRAVENOUS
  Administered 2016-07-28: 200 mg via INTRAVENOUS
  Administered 2016-07-28: 20 mg via INTRAVENOUS
  Administered 2016-07-28: 10 mg via INTRAVENOUS

## 2016-07-28 MED ORDER — BUPIVACAINE LIPOSOME 1.3 % IJ SUSP
20.0000 mL | INTRAMUSCULAR | Status: AC
  Administered 2016-07-28: 20 mL
  Filled 2016-07-28: qty 20

## 2016-07-28 MED ORDER — ACETAMINOPHEN 325 MG PO TABS
650.0000 mg | ORAL_TABLET | Freq: Four times a day (QID) | ORAL | Status: DC | PRN
  Administered 2016-07-29 (×2): 650 mg via ORAL
  Filled 2016-07-28 (×2): qty 2

## 2016-07-28 MED ORDER — LACTATED RINGERS IV SOLN
INTRAVENOUS | Status: DC | PRN
  Administered 2016-07-28 (×3): via INTRAVENOUS

## 2016-07-28 MED ORDER — SODIUM CHLORIDE 0.9 % IJ SOLN
INTRAMUSCULAR | Status: DC | PRN
  Administered 2016-07-28: 20 mL via INTRAVENOUS

## 2016-07-28 MED ORDER — MIDAZOLAM HCL 2 MG/2ML IJ SOLN
INTRAMUSCULAR | Status: AC
  Filled 2016-07-28: qty 2

## 2016-07-28 MED ORDER — ONDANSETRON HCL 4 MG/2ML IJ SOLN
INTRAMUSCULAR | Status: AC
  Filled 2016-07-28: qty 2

## 2016-07-28 MED ORDER — RIVAROXABAN 10 MG PO TABS
10.0000 mg | ORAL_TABLET | Freq: Every day | ORAL | Status: DC
  Administered 2016-07-29 – 2016-08-01 (×4): 10 mg via ORAL
  Filled 2016-07-28 (×4): qty 1

## 2016-07-28 MED ORDER — HYDROMORPHONE HCL 1 MG/ML IJ SOLN
INTRAMUSCULAR | Status: AC
  Filled 2016-07-28: qty 0.5

## 2016-07-28 MED ORDER — SODIUM CHLORIDE 0.9 % IR SOLN
Status: DC | PRN
  Administered 2016-07-28: 3000 mL

## 2016-07-28 MED ORDER — HYDROMORPHONE HCL 1 MG/ML IJ SOLN
0.2500 mg | INTRAMUSCULAR | Status: DC | PRN
  Administered 2016-07-28 (×3): 0.5 mg via INTRAVENOUS

## 2016-07-28 MED ORDER — TRANEXAMIC ACID 1000 MG/10ML IV SOLN
2000.0000 mg | INTRAVENOUS | Status: AC
  Administered 2016-07-28: 2000 mg via TOPICAL
  Filled 2016-07-28: qty 20

## 2016-07-28 MED ORDER — METOPROLOL SUCCINATE ER 25 MG PO TB24
25.0000 mg | ORAL_TABLET | Freq: Every day | ORAL | Status: DC
  Administered 2016-07-29 – 2016-08-01 (×3): 25 mg via ORAL
  Filled 2016-07-28 (×4): qty 1

## 2016-07-28 MED ORDER — MENTHOL 3 MG MT LOZG: 1.0000 | LOZENGE | OROMUCOSAL | Status: DC | PRN

## 2016-07-28 MED ORDER — ROCURONIUM BROMIDE 100 MG/10ML IV SOLN
INTRAVENOUS | Status: DC | PRN
  Administered 2016-07-28: 50 mg via INTRAVENOUS
  Administered 2016-07-28: 10 mg via INTRAVENOUS

## 2016-07-28 MED ORDER — POTASSIUM CHLORIDE IN NACL 20-0.9 MEQ/L-% IV SOLN
INTRAVENOUS | Status: AC
  Administered 2016-07-28 – 2016-07-29 (×2): via INTRAVENOUS
  Filled 2016-07-28 (×2): qty 1000

## 2016-07-28 MED ORDER — TRAMADOL HCL 50 MG PO TABS: 50.0000 mg | ORAL_TABLET | Freq: Four times a day (QID) | ORAL | Status: DC | PRN

## 2016-07-28 MED ORDER — OXYCODONE HCL 5 MG PO TABS
ORAL_TABLET | ORAL | Status: AC
  Administered 2016-07-28: 10 mg via ORAL
  Filled 2016-07-28: qty 2

## 2016-07-28 MED ORDER — DEXAMETHASONE SODIUM PHOSPHATE 10 MG/ML IJ SOLN
INTRAMUSCULAR | Status: AC
  Filled 2016-07-28: qty 1

## 2016-07-28 MED ORDER — PHENYLEPHRINE HCL 10 MG/ML IJ SOLN
INTRAVENOUS | Status: DC | PRN
  Administered 2016-07-28: 10 ug/min via INTRAVENOUS

## 2016-07-28 MED ORDER — PHENOL 1.4 % MT LIQD: 1.0000 | OROMUCOSAL | Status: DC | PRN

## 2016-07-28 MED ORDER — PHENYLEPHRINE 40 MCG/ML (10ML) SYRINGE FOR IV PUSH (FOR BLOOD PRESSURE SUPPORT)
PREFILLED_SYRINGE | INTRAVENOUS | Status: AC
  Filled 2016-07-28: qty 10

## 2016-07-28 MED ORDER — OXYCODONE HCL 5 MG PO TABS
5.0000 mg | ORAL_TABLET | ORAL | Status: DC | PRN
  Administered 2016-07-28 – 2016-08-01 (×15): 10 mg via ORAL
  Filled 2016-07-28 (×17): qty 2

## 2016-07-28 MED ORDER — SUGAMMADEX SODIUM 200 MG/2ML IV SOLN
INTRAVENOUS | Status: AC
  Filled 2016-07-28: qty 2

## 2016-07-28 MED ORDER — BUPIVACAINE HCL (PF) 0.25 % IJ SOLN
INTRAMUSCULAR | Status: DC | PRN
  Administered 2016-07-28: 30 mL

## 2016-07-28 MED ORDER — ALBUMIN HUMAN 5 % IV SOLN
INTRAVENOUS | Status: DC | PRN
  Administered 2016-07-28 (×3): via INTRAVENOUS

## 2016-07-28 MED ORDER — THEOPHYLLINE ER 300 MG PO TB12
150.0000 mg | ORAL_TABLET | Freq: Every day | ORAL | Status: DC
  Administered 2016-07-28 – 2016-07-31 (×4): 150 mg via ORAL
  Filled 2016-07-28 (×4): qty 1

## 2016-07-28 MED ORDER — DEXAMETHASONE SODIUM PHOSPHATE 10 MG/ML IJ SOLN
INTRAMUSCULAR | Status: DC | PRN
  Administered 2016-07-28: 8 mg via INTRAVENOUS

## 2016-07-28 MED ORDER — ONDANSETRON HCL 4 MG PO TABS: 4.0000 mg | ORAL_TABLET | Freq: Four times a day (QID) | ORAL | Status: DC | PRN

## 2016-07-28 MED ORDER — TRANEXAMIC ACID 1000 MG/10ML IV SOLN
1000.0000 mg | INTRAVENOUS | Status: AC
  Administered 2016-07-28: 1000 mg via INTRAVENOUS
  Filled 2016-07-28: qty 10

## 2016-07-28 MED ORDER — FENTANYL CITRATE (PF) 100 MCG/2ML IJ SOLN
INTRAMUSCULAR | Status: AC
  Filled 2016-07-28: qty 4

## 2016-07-28 MED ORDER — METOCLOPRAMIDE HCL 5 MG PO TABS: 5.0000 mg | ORAL_TABLET | Freq: Three times a day (TID) | ORAL | Status: DC | PRN

## 2016-07-28 MED ORDER — PHENYLEPHRINE HCL 10 MG/ML IJ SOLN
INTRAMUSCULAR | Status: DC | PRN
  Administered 2016-07-28 (×3): .08 mg via INTRAVENOUS

## 2016-07-28 MED ORDER — METOCLOPRAMIDE HCL 5 MG/ML IJ SOLN: 5.0000 mg | Freq: Three times a day (TID) | INTRAMUSCULAR | Status: DC | PRN

## 2016-07-28 MED ORDER — MOMETASONE FURO-FORMOTEROL FUM 200-5 MCG/ACT IN AERO
2.0000 | INHALATION_SPRAY | Freq: Two times a day (BID) | RESPIRATORY_TRACT | Status: DC
  Administered 2016-07-28 – 2016-08-01 (×6): 2 via RESPIRATORY_TRACT
  Filled 2016-07-28: qty 8.8

## 2016-07-28 MED ORDER — POVIDONE-IODINE 7.5 % EX SOLN
Freq: Once | CUTANEOUS | Status: DC
  Filled 2016-07-28: qty 118

## 2016-07-28 MED ORDER — PROMETHAZINE HCL 25 MG/ML IJ SOLN: 6.2500 mg | INTRAMUSCULAR | Status: DC | PRN

## 2016-07-28 MED ORDER — 0.9 % SODIUM CHLORIDE (POUR BTL) OPTIME
TOPICAL | Status: DC | PRN
  Administered 2016-07-28: 1000 mL

## 2016-07-28 MED ORDER — LIDOCAINE HCL (CARDIAC) 20 MG/ML IV SOLN
INTRAVENOUS | Status: DC | PRN
Start: 2016-07-28 — End: 2016-07-28
  Administered 2016-07-28: 100 mg via INTRAVENOUS

## 2016-07-28 MED ORDER — DSS 100 MG PO CAPS: 100.0000 mg | ORAL_CAPSULE | Freq: Two times a day (BID) | ORAL | Status: DC

## 2016-07-28 MED ORDER — SUGAMMADEX SODIUM 200 MG/2ML IV SOLN
INTRAVENOUS | Status: DC | PRN
  Administered 2016-07-28: 170 mg via INTRAVENOUS

## 2016-07-28 MED ORDER — MORPHINE SULFATE (PF) 2 MG/ML IV SOLN: 2.0000 mg | INTRAVENOUS | Status: DC | PRN

## 2016-07-28 MED ORDER — BRINZOLAMIDE 1 % OP SUSP
1.0000 [drp] | Freq: Every day | OPHTHALMIC | Status: DC
  Administered 2016-07-29 – 2016-08-01 (×4): 1 [drp] via OPHTHALMIC
  Filled 2016-07-28: qty 10

## 2016-07-28 SURGICAL SUPPLY — 77 items
BLADE CLIPPER SURG (BLADE) IMPLANT
BLADE SAW RECIP 87.9 MT (BLADE) IMPLANT
BLADE SAW SGTL 18X1.27X75 (BLADE) ×2 IMPLANT
BRUSH FEMORAL CANAL (MISCELLANEOUS) ×2 IMPLANT
CAPT HIP TOTAL 2 ×2 IMPLANT
CLSR STERI-STRIP ANTIMIC 1/2X4 (GAUZE/BANDAGES/DRESSINGS) ×2 IMPLANT
CONT SPEC 4OZ CLIKSEAL STRL BL (MISCELLANEOUS) ×2 IMPLANT
COVER BACK TABLE 24X17X13 BIG (DRAPES) IMPLANT
DRAPE IMP U-DRAPE 54X76 (DRAPES) ×2 IMPLANT
DRAPE INCISE IOBAN 66X45 STRL (DRAPES) IMPLANT
DRAPE ORTHO SPLIT 77X108 STRL (DRAPES) ×2
DRAPE PROXIMA HALF (DRAPES) ×2 IMPLANT
DRAPE SURG 17X23 STRL (DRAPES) IMPLANT
DRAPE SURG ORHT 6 SPLT 77X108 (DRAPES) ×2 IMPLANT
DRAPE U-SHAPE 47X51 STRL (DRAPES) ×2 IMPLANT
DRILL BIT 7/64X5 (BIT) IMPLANT
DRSG AQUACEL AG ADV 3.5X10 (GAUZE/BANDAGES/DRESSINGS) ×2 IMPLANT
DRSG MEPILEX BORDER 4X12 (GAUZE/BANDAGES/DRESSINGS) ×2 IMPLANT
DURAPREP 26ML APPLICATOR (WOUND CARE) ×2 IMPLANT
ELECT BLADE 6.5 EXT (BLADE) IMPLANT
ELECT CAUTERY BLADE 6.4 (BLADE) ×2 IMPLANT
ELECT REM PT RETURN 9FT ADLT (ELECTROSURGICAL) ×2
ELECTRODE REM PT RTRN 9FT ADLT (ELECTROSURGICAL) ×1 IMPLANT
EVACUATOR 1/8 PVC DRAIN (DRAIN) IMPLANT
FACESHIELD WRAPAROUND (MASK) ×2 IMPLANT
GLOVE BIO SURGEON ST LM GN SZ9 (GLOVE) ×2 IMPLANT
GLOVE BIOGEL PI IND STRL 8 (GLOVE) ×1 IMPLANT
GLOVE BIOGEL PI INDICATOR 8 (GLOVE) ×1
GLOVE SURG ORTHO 8.0 STRL STRW (GLOVE) ×2 IMPLANT
GOWN STRL REUS W/ TWL LRG LVL3 (GOWN DISPOSABLE) ×1 IMPLANT
GOWN STRL REUS W/ TWL XL LVL3 (GOWN DISPOSABLE) ×1 IMPLANT
GOWN STRL REUS W/TWL LRG LVL3 (GOWN DISPOSABLE) ×1
GOWN STRL REUS W/TWL XL LVL3 (GOWN DISPOSABLE) ×1
HANDPIECE INTERPULSE COAX TIP (DISPOSABLE) ×1
HOOD PEEL AWAY FACE SHEILD DIS (HOOD) ×6 IMPLANT
IMMOBILIZER KNEE 20 (SOFTGOODS) IMPLANT
IMMOBILIZER KNEE 22 UNIV (SOFTGOODS) IMPLANT
IMMOBILIZER KNEE 24 THIGH 36 (MISCELLANEOUS) IMPLANT
IMMOBILIZER KNEE 24 UNIV (MISCELLANEOUS)
KIT BASIN OR (CUSTOM PROCEDURE TRAY) ×2 IMPLANT
KIT ROOM TURNOVER OR (KITS) ×2 IMPLANT
MANIFOLD NEPTUNE II (INSTRUMENTS) ×2 IMPLANT
NDL SUT .5 MAYO 1.404X.05X (NEEDLE) IMPLANT
NEEDLE 22X1 1/2 (OR ONLY) (NEEDLE) ×4 IMPLANT
NEEDLE HYPO 25GX1X1/2 BEV (NEEDLE) IMPLANT
NEEDLE MAYO TAPER (NEEDLE)
NS IRRIG 1000ML POUR BTL (IV SOLUTION) ×4 IMPLANT
PACK TOTAL JOINT (CUSTOM PROCEDURE TRAY) ×2 IMPLANT
PACK UNIVERSAL I (CUSTOM PROCEDURE TRAY) ×2 IMPLANT
PAD ARMBOARD 7.5X6 YLW CONV (MISCELLANEOUS) ×4 IMPLANT
PASSER SUT SWANSON 36MM LOOP (INSTRUMENTS) IMPLANT
PILLOW ABDUCTION HIP (SOFTGOODS) IMPLANT
PIN STEINMAN 3/16 (PIN) IMPLANT
PRESSURIZER FEMORAL UNIV (MISCELLANEOUS) IMPLANT
SET HNDPC FAN SPRY TIP SCT (DISPOSABLE) ×1 IMPLANT
SPONGE LAP 18X18 X RAY DECT (DISPOSABLE) ×2 IMPLANT
SPONGE LAP 4X18 X RAY DECT (DISPOSABLE) ×2 IMPLANT
STAPLER VISISTAT 35W (STAPLE) IMPLANT
SUCTION FRAZIER HANDLE 10FR (MISCELLANEOUS) ×2
SUCTION TUBE FRAZIER 10FR DISP (MISCELLANEOUS) ×2 IMPLANT
SUT ETHIBOND NAB CT1 #1 30IN (SUTURE) ×4 IMPLANT
SUT VIC AB 0 CT1 27 (SUTURE) ×3
SUT VIC AB 0 CT1 27XBRD ANBCTR (SUTURE) ×3 IMPLANT
SUT VIC AB 1 CT1 27 (SUTURE) ×8
SUT VIC AB 1 CT1 27XBRD ANBCTR (SUTURE) ×8 IMPLANT
SUT VIC AB 2-0 CT1 27 (SUTURE) ×3
SUT VIC AB 2-0 CT1 TAPERPNT 27 (SUTURE) ×3 IMPLANT
SYR 30ML LL (SYRINGE) ×4 IMPLANT
SYR CONTROL 10ML LL (SYRINGE) IMPLANT
TOWEL OR 17X24 6PK STRL BLUE (TOWEL DISPOSABLE) ×2 IMPLANT
TOWEL OR 17X26 10 PK STRL BLUE (TOWEL DISPOSABLE) ×2 IMPLANT
TOWER CARTRIDGE SMART MIX (DISPOSABLE) IMPLANT
TRAY CATH 16FR W/PLASTIC CATH (SET/KITS/TRAYS/PACK) IMPLANT
TRAY FOLEY CATH 14FRSI W/METER (CATHETERS) ×2 IMPLANT
TRAY FOLEY W/METER SILVER 16FR (SET/KITS/TRAYS/PACK) IMPLANT
WATER STERILE IRR 1000ML POUR (IV SOLUTION) IMPLANT
YANKAUER SUCT BULB TIP NO VENT (SUCTIONS) ×2 IMPLANT

## 2016-07-28 NOTE — Op Note (Signed)
NAME:  Leslie Ball, Leslie Ball NO.:  [id number]  MEDICAL RECORD NO.:  [id number]  LOCATION:  PERIO                        FACILITY:  [facility]  PHYSICIAN:  Burnard Bunting, M.D.    DATE OF BIRTH:  [birth year]  DATE OF PROCEDURE: DATE OF DISCHARGE:                              OPERATIVE REPORT   PREOPERATIVE DIAGNOSIS:  Left hip arthritis.  POSTOPERATIVE DIAGNOSIS:  Left hip arthritis.  PROCEDURE:  Left total hip replacement utilizing DePuy components Pinnacle acetabular cup 48 mm with +4 liner, two 6.5 x 25 mm cancellous bone screws, 1 hole eliminator, size 11 Corail stem and ceramic femoral head 32 mm +1.  SURGEON ATTENDING:  Burnard Bunting, M.D.  ASSIST:  Patrick Jupiter, RNFA.  INDICATIONS:  Leslie Ball is a 76 year old patient with left hip arthritis refractory to nonoperative management, presents for operative management after explanation of risks and benefits.  PROCEDURE IN DETAIL:  The patient was brought to the operating room where general endotracheal anesthetic was induced.  Preoperative antibiotics were administered.  Time-out was called.  The patient was placed on the Hana bed with the legs in neutral rotation.  Bony prominences were padded.  Some of her abdominal tissue, which was overlying was taped back.  The area was then pre scrubbed with alcohol and Betadine, which was then allowed to air dry and prepped with DuraPrep solution and a wall drape was utilized with [place].  Time-out was called.  Preoperative radiographs demonstrated the leg lengths as well as offset.  At this time, an incision was made 2 cm posterior and distal to the anterior superior iliac crest.  Skin and subcu tissue were sharply divided.  Fascia lata was encountered.  This was divided and plane was developed anteriorly between the rectus and the tensor fascia lata.  The crossing circumflex vessels were identified and coagulated. Superior Cobra retractor placed over the superior femoral neck  after elevating some of the rectus.  The inferior femoral neck was then retracted with another blunt Cobra retractor.  Pericapsular fat was removed.  Bleeding points encountered and controlled using electrocautery.  Capsulotomy in an L shape was then performed and tagged with a #1 Ethibond suture.  Both limbs were tagged.  At this time, the leg was externally rotated, and the capsular dissection was taken down to the lesser trochanter.  The retractors were then placed inside the femoral neck region.  The femoral neck cut was then made and then revisited to about slightly less than a fingerbreadth above the top of the lesser.  The head was removed.  At this time, under fluoroscopic guidance, the neck cut was deemed to be sufficient.  Right angle retractor placed at about the 10 o'clock position on the acetabulum and then another one at the 4 o'clock position on the left acetabulum. Labrum was excised.  Inferior capsule was released.  The acetabulum was then reamed under fluoroscopic guidance in about 45 degrees of abduction and 10 degrees of anteversion.  Cup was then placed.  A 48 mm press-fit cup was placed on the 47 mm reamed acetabulum.  Good press-fit was achieved.  Two screws were placed, one at the 3 o'clock, one at the  4 o'clock position, 25 mm.  The hole eliminator was also placed and a +4 liner was placed.  At this time, attention was directed toward the femur.  The anterior retractor was placed around the femoral neck.  The retractor was then placed between the capsule and the trochanteric musculature.  Capsule was then opened like a door and the conjoint tendon and piriformis tendons were released.  This allowed more external rotation of the hip.  The hip was then taken in the extension and adduction for improved visualization.  The femur was then prepped and broached up to size 11, which under fluoroscopic guidance gave good fill of the canal and in good stability.  The broach  was removed after reducing it and determining stability.  True component was then placed with same stability and parameters maintained.  The patient was stable with internal rotation to 50 degrees as well as external rotation to a 90 and 45 degrees of extension.  At this time, thorough irrigation was performed.  A ceramic ball head was utilized.  Capsule was then should be noted that tranexamic acid sponges were placed 2 times during the case to help with the bleeding.  Exparel was then injected into the capsule as well as the skin.  The capsule was then closed using a #1 Vicryl suture.  The fascia lata was then closed in watertight fashion using #1 running Vicryl suture.  Zero Vicryl suture, 2-0 Vicryl suture, and running 3-0 Monocryl was then used to close the skin.  The patient tolerated procedure well without immediate complications, transferred to the recovery room in stable condition.     Burnard Bunting, M.D.     GSD/MEDQ  D:  07/28/2016  T:  07/28/2016  Job:  [id number]

## 2016-07-28 NOTE — Progress Notes (Signed)
   Met w/ patient and family regarding patient 's request for [protestant] communion.  Per nurse, pt. has no dietary restrictions.  Patient not part of a local parish.   Will follow-up (07/31/16)  - Rev. Weed MDiv ThM

## 2016-07-28 NOTE — Transfer of Care (Signed)
Immediate Anesthesia Transfer of Care Note  Patient: Leslie Ball  Procedure(s) Performed: Procedure(s): LEFT ANTERIOR TOTAL HIP ARTHROPLASTY (Left)  Patient Location: PACU  Anesthesia Type:General  Level of Consciousness: awake, alert , oriented and patient cooperative  Airway & Oxygen Therapy: Patient Spontanous Breathing and Patient connected to face mask oxygen  Post-op Assessment: Report given to RN and Post -op Vital signs reviewed and stable  Post vital signs: Reviewed and stable  Last Vitals:  Vitals:   07/28/16 0640  BP: (!) 177/85  Pulse: 73  Resp: 20  Temp: 36.6 C    Last Pain:  Vitals:   07/28/16 0642  PainSc: 7          Complications: No apparent anesthesia complications

## 2016-07-28 NOTE — Anesthesia Procedure Notes (Signed)
Procedure Name: Intubation Date/Time: 07/28/2016 7:53 AM Performed by: Wilder Glade Pre-anesthesia Checklist: Patient identified, Emergency Drugs available, Suction available, Patient being monitored and Timeout performed Patient Re-evaluated:Patient Re-evaluated prior to inductionOxygen Delivery Method: Circle system utilized Preoxygenation: Pre-oxygenation with 100% oxygen Intubation Type: IV induction Ventilation: Mask ventilation without difficulty Laryngoscope Size: Miller and 2 Grade View: Grade I Tube type: Oral Tube size: 7.0 mm Number of attempts: 1 Airway Equipment and Method: Stylet Placement Confirmation: ETT inserted through vocal cords under direct vision,  positive ETCO2 and breath sounds checked- equal and bilateral Secured at: 22 cm Tube secured with: Tape Dental Injury: Teeth and Oropharynx as per pre-operative assessment

## 2016-07-28 NOTE — Anesthesia Postprocedure Evaluation (Addendum)
Anesthesia Post Note  Patient: Leslie Ball  Procedure(s) Performed: Procedure(s) (LRB): LEFT ANTERIOR TOTAL HIP ARTHROPLASTY (Left)  Patient location during evaluation: PACU Anesthesia Type: General Level of consciousness: awake and alert Pain management: pain level controlled Vital Signs Assessment: post-procedure vital signs reviewed and stable Respiratory status: spontaneous breathing, nonlabored ventilation, respiratory function stable and patient connected to nasal cannula oxygen Cardiovascular status: blood pressure returned to baseline and stable Postop Assessment: no signs of nausea or vomiting Anesthetic complications: no       Last Vitals:  Vitals:   07/28/16 1330 07/28/16 1437  BP: 131/66 (!) 139/55  Pulse: 67 67  Resp: 12 16  Temp:  36.7 C    Last Pain:  Vitals:   07/28/16 1437  TempSrc: Oral  PainSc:                  Kennieth Rad

## 2016-07-28 NOTE — H&P (Signed)
TOTAL HIP ADMISSION H&P  Patient is admitted for left total hip arthroplasty.  Subjective:  Chief Complaint: left hip pain  HPI: Leslie Ball, 76 y.o. female, has a history of pain and functional disability in the left hip(s) due to arthritis and patient has failed non-surgical conservative treatments for greater than 12 weeks to include NSAID's and/or analgesics, corticosteriod injections, use of assistive devices and activity modification.  Onset of symptoms was abrupt starting 1 years ago with rapidlly worsening course since that time.The patient noted no past surgery on the left hip(s).  Patient currently rates pain in the left hip at 10 out of 10 with activity. Patient has night pain, worsening of pain with activity and weight bearing, trendelenberg gait, pain that interfers with activities of daily living and pain with passive range of motion. Patient has evidence of subchondral cysts, subchondral sclerosis and joint space narrowing by imaging studies. This condition presents safety issues increasing the risk of falls. This patient has had Successful bilateral knee replacements within the past several years.  There is no current active infection.  Patient Active Problem List   Diagnosis Date Noted  . Primary osteoarthritis of left hip 06/15/2016  . Nausea with vomiting 07/21/2013  . Unspecified constipation 07/21/2013  . Abdominal pain, epigastric 07/21/2013  . Arthritis of knee 07/15/2013  . Arthritis of left knee 04/15/2013   Past Medical History:  Diagnosis Date  . Anemia   . Arthritis    "left knee; probably left shoulder & elbow" (07/15/2013)  . Asthma   . 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 Constipation    occasionally  . GERD (gastroesophageal reflux disease)    rare use of Pepto bismol  . Hypertension   . Neuromuscular disorder (HCC)    right hand has "pins and needles" feeling at times  .  RA (rheumatoid arthritis) (HCC)     Past Surgical History:  Procedure Laterality Date  . CATARACT EXTRACTION W/ INTRAOCULAR LENS IMPLANT Bilateral   . EYE SURGERY Right   . JOINT REPLACEMENT    . REFRACTIVE SURGERY Bilateral   . RETINAL DETACHMENT SURGERY Right   . TOTAL KNEE ARTHROPLASTY Right 04/15/2013   Procedure: TOTAL KNEE ARTHROPLASTY;  Surgeon: Cammy Copa, MD;  Location: [facility] OR;  Service: Orthopedics;  Laterality: Right;  . TOTAL KNEE ARTHROPLASTY Left 07/15/2013  . TOTAL KNEE ARTHROPLASTY Left 07/15/2013   Procedure: TOTAL KNEE ARTHROPLASTY- left;  Surgeon: Cammy Copa, MD;  Location: [facility] OR;  Service: Orthopedics;  Laterality: Left;    Facility-Administered Medications Prior to Admission  Medication Dose Route Frequency Provider Last Rate Last Dose  . lidocaine (PF) (XYLOCAINE) 1 % injection 0.3 mL  0.3 mL Other Once Tyrell Antonio, MD       Prescriptions Prior to Admission  Medication Sig Dispense Refill Last Dose  . brinzolamide (AZOPT) 1 % ophthalmic suspension Place 1 drop into the left eye daily.    07/28/2016 at Unknown time  . docusate sodium 100 MG CAPS Take 100 mg by mouth 2 (two) times daily. (Patient taking differently: Take 100 mg by mouth 2 (two) times daily as needed (for constipation). ) 10 capsule 0 07/27/2016 at Unknown time  . Fluticasone-Salmeterol (ADVAIR) 250-50 MCG/DOSE AEPB Inhale 1 puff into the lungs every 12 (twelve) hours.    07/28/2016 at 0300  . hydrochlorothiazide (MICROZIDE) 12.5 MG capsule Take 12.5 mg  by mouth daily after supper.    07/27/2016 at Unknown time  . meloxicam (MOBIC) 7.5 MG tablet Take 7.5 mg by mouth 2 (two) times daily as needed. For pain.  5 Past Week at Unknown time  . metoprolol succinate (TOPROL-XL) 25 MG 24 hr tablet Take 25 mg by mouth daily.  5 07/28/2016 at 0300  . theophylline (THEODUR) 300 MG 12 hr tablet Take 150 mg by mouth at bedtime.   07/27/2016 at Unknown time  . traMADol (ULTRAM) 50 MG tablet Take 50 mg by mouth  every 6 (six) hours as needed (FOR PAIN.).    Past Week at Unknown time  . Travoprost, BAK Free, (TRAVATAN) 0.004 % SOLN ophthalmic solution Place 1 drop into both eyes at bedtime.    07/28/2016 at Unknown time  . acetaminophen-codeine (TYLENOL #3) 300-30 MG tablet Take 1 tablet by mouth every 6 (six) hours as needed for moderate pain. (Patient not taking: Reported on 07/25/2016) 30 tablet 0 Not Taking at Unknown time   Allergies  Allergen Reactions  . Chlorhexidine Itching    REACTION: redness    Social History  Substance Use Topics  . Smoking status: Never Smoker  . Smokeless tobacco: Never Used  . Alcohol use No    No family history on file.   Review of Systems  Musculoskeletal: Positive for joint pain.  All other systems reviewed and are negative.   Objective:  Physical Exam  Constitutional: She appears well-developed.  HENT:  Head: Normocephalic.  Eyes: Pupils are equal, round, and reactive to light.  Neck: Normal range of motion.  Cardiovascular: Normal rate.   Respiratory: Effort normal.  Neurological: She is alert.  Skin: Skin is warm.  Psychiatric: She has a normal mood and affect.  Examination of the left hip demonstrates groin pain with internal/external rotation of the leg.  Good ankle dorsiflexion plantar flexion strength.  Palpable pedal pulses.  Approximately equal leg lengths.  Trendelenburg gait is present.  Vital signs in last 24 hours: Temp:  [97.9 F (36.6 C)] 97.9 F (36.6 C) (03/09 0640) Pulse Rate:  [73-83] 73 (03/09 0640) Resp:  [20] 20 (03/09 0640) BP: (131-177)/(74-85) 177/85 (03/09 0640) SpO2:  [99 %-100 %] 100 % (03/09 0640) Weight:  [182 lb 8 oz (82.8 kg)] 182 lb 8 oz (82.8 kg) (03/08 1117)  Labs:   Estimated body mass index is 35.64 kg/m as calculated from the following:   Height as of 07/27/16: 5' (1.524 m).   Weight as of 07/27/16: 182 lb 8 oz (82.8 kg).   Imaging Review Plain radiographs demonstrate severe degenerative joint disease of  the left hip(s). The bone quality appears to be fair for age and reported activity level.  Assessment/Plan:  End stage arthritis, left hip(s)  The patient history, physical examination, clinical judgement of the provider and imaging studies are consistent with end stage degenerative joint disease of the left hip(s) and total hip arthroplasty is deemed medically necessary. The treatment options including medical management, injection therapy, arthroscopy and arthroplasty were discussed at length. The risks and benefits of total hip arthroplasty were presented and reviewed. The risks due to aseptic loosening, infection, stiffness, dislocation/subluxation,  thromboembolic complications and other imponderables were discussed.  The patient acknowledged the explanation, agreed to proceed with the plan and consent was signed. Patient is being admitted for inpatient treatment for surgery, pain control, PT, OT, prophylactic antibiotics, VTE prophylaxis, progressive ambulation and ADL's and discharge planning.The patient is planning to be discharged home with home  health services

## 2016-07-28 NOTE — Brief Op Note (Signed)
07/28/2016  11:20 AM  PATIENT:  Lysle Dingwall Rorrer  76 y.o. female  PRE-OPERATIVE DIAGNOSIS:  LEFT HIP OSTEOARTHRITIS  POST-OPERATIVE DIAGNOSIS:  LEFT HIP OSTEOARTHRITIS  PROCEDURE:  Procedure(s): LEFT ANTERIOR TOTAL HIP ARTHROPLASTY  SURGEON:  Surgeon(s): Cammy Copa, MD  ASSISTANT: Patrick Jupiter rnfa  ANESTHESIA:   general  EBL: 650 ml    Total I/O In: 1500 [I.V.:1000; IV Piggyback:500] Out: 970 [Urine:170; Blood:800]  BLOOD ADMINISTERED: none  DRAINS: none   LOCAL MEDICATIONS USED: marcaine exparel  SPECIMEN:  No Specimen  COUNTS:  YES  TOURNIQUET:  * No tourniquets in log *  DICTATION: .Other Dictation: Dictation Number [id number]  PLAN OF CARE: Admit to inpatient   PATIENT DISPOSITION:  PACU - hemodynamically stable

## 2016-07-29 LAB — URINE CULTURE

## 2016-07-29 LAB — BASIC METABOLIC PANEL
Anion gap: 7 (ref 5–15)
BUN: 12 mg/dL (ref 6–20)
CO2: 26 mmol/L (ref 22–32)
Calcium: 8.1 mg/dL — ABNORMAL LOW (ref 8.9–10.3)
Chloride: 107 mmol/L (ref 101–111)
Creatinine, Ser: 0.69 mg/dL (ref 0.44–1.00)
GFR calc Af Amer: >60 mL/min (ref >60)
GLUCOSE: 114 mg/dL — AB (ref 65–99)
Potassium: 3.5 mmol/L (ref 3.5–5.1)
Sodium: 140 mmol/L (ref 135–145)

## 2016-07-29 LAB — CBC
HCT: 24 % — ABNORMAL LOW (ref 36.0–46.0)
HEMOGLOBIN: 7.3 g/dL — AB (ref 12.0–15.0)
MCH: 24.6 pg — ABNORMAL LOW (ref 26.0–34.0)
MCHC: 30.4 g/dL (ref 30.0–36.0)
MCV: 80.8 fL (ref 78.0–100.0)
PLATELETS: 316 10*3/uL (ref 150–400)
RBC: 2.97 MIL/uL — AB (ref 3.87–5.11)
RDW: 18 % — ABNORMAL HIGH (ref 11.5–15.5)
WBC: 8.2 10*3/uL (ref 4.0–10.5)

## 2016-07-29 LAB — HEMOGLOBIN AND HEMATOCRIT, BLOOD
HCT: 23.9 % — ABNORMAL LOW (ref 36.0–46.0)
HEMOGLOBIN: 7.4 g/dL — AB (ref 12.0–15.0)

## 2016-07-29 LAB — PREPARE RBC (CROSSMATCH)

## 2016-07-29 MED ORDER — SODIUM CHLORIDE 0.9 % IV SOLN
Freq: Once | INTRAVENOUS | Status: AC
  Administered 2016-07-29: 16:00:00 via INTRAVENOUS

## 2016-07-29 NOTE — Progress Notes (Signed)
Physical Therapy Treatment Patient Details Name: Leslie Ball MRN: [id number] DOB: 10/02/1940 Today's Date: 07/29/2016    History of Present Illness pt is a 76 yo female admitted for elective L THA. PMH significant for R eye blindness, GERD, HTN, RA, L TKA 2015 and R TKA 2014.     PT Comments    Pt is receiving IV blood transfusion when PT arrives. RN is okay with therapy mobilizing patient to bathroom and getting up in chair for dinner. Pt is able to increase gait distance and improved transfers noted this session. Pt continues to be limited by pain and fatigue. Family is helpful with translation when needed.     Follow Up Recommendations  Home health PT;Supervision for mobility/OOB     Equipment Recommendations  Rolling walker with 5" wheels;3in1 (PT)    Recommendations for Other Services       Precautions / Restrictions Precautions Precautions: None Restrictions Weight Bearing Restrictions: Yes LLE Weight Bearing: Weight bearing as tolerated    Mobility  Bed Mobility Overal bed mobility: Needs Assistance Bed Mobility: Supine to Sit     Supine to sit: Mod assist Sit to supine: Min assist   General bed mobility comments: Assist for LEs and trunk elevation from supine to sit. Cues for hand placement and technique.   Transfers Overall transfer level: Needs assistance Equipment used: Rolling walker (2 wheeled) Transfers: Sit to/from Stand Sit to Stand: Min assist Stand pivot transfers: Min assist       General transfer comment: Min assist to boost up and for standing balance. Cues for hand placement and technique with RW.  Ambulation/Gait Ambulation/Gait assistance: Min assist Ambulation Distance (Feet): 30 Feet Assistive device: Rolling walker (2 wheeled) Gait Pattern/deviations: Step-through pattern;Decreased weight shift to right;Antalgic Gait velocity: decreased Gait velocity interpretation: Below normal speed for age/gender General Gait Details: Cues  for sequencing, Min A for safety and to maneuver equipment.    Stairs            Wheelchair Mobility    Modified Rankin (Stroke Patients Only)       Balance Overall balance assessment: Needs assistance Sitting-balance support: Feet supported;No upper extremity supported Sitting balance-Leahy Scale: Fair     Standing balance support: Single extremity supported Standing balance-Leahy Scale: Fair Standing balance comment: Able to complete peri care in standing with one hand supported                    Cognition Arousal/Alertness: Awake/alert Behavior During Therapy: WFL for tasks assessed/performed Overall Cognitive Status: Within Functional Limits for tasks assessed                      Exercises Total Joint Exercises Ankle Circles/Pumps: AROM;Both;10 reps;Supine Quad Sets: AROM;10 reps;Supine;Left    General Comments        Pertinent Vitals/Pain Pain Assessment: 0-10 Pain Score: 7  Pain Location: L hip Pain Descriptors / Indicators: Grimacing;Guarding;Aching;Sore Pain Intervention(s): Monitored during session;Premedicated before session;Ice applied    Home Living Family/patient expects to be discharged to:: Private residence Living Arrangements: Alone Available Help at Discharge: Family Type of Home: Apartment Home Access: Stairs to enter Entrance Stairs-Rails: None Home Layout: One level Home Equipment: Environmental consultant - 2 wheels;Bedside commode Additional Comments: will hire help or have her son and his wife help at discahrge. Doesn't want Rehab    Prior Function Level of Independence: Independent with assistive device(s)      Comments: Has weakness in upper extremities. Can use  RW   PT Goals (current goals can now be found in the care plan section) Acute Rehab PT Goals Patient Stated Goal: to go home with help PT Goal Formulation: With patient Time For Goal Achievement: 08/05/16 Potential to Achieve Goals: Good Progress towards PT goals:  Progressing toward goals    Frequency    7X/week      PT Plan Current plan remains appropriate    Co-evaluation             End of Session Equipment Utilized During Treatment: Gait belt Activity Tolerance: Patient limited by pain Patient left: in chair;with call bell/phone within reach;with family/visitor present Nurse Communication: Mobility status PT Visit Diagnosis: Difficulty in walking, not elsewhere classified (R26.2);Pain Pain - Right/Left: Left Pain - part of body: Hip     Time: 3810-1751 PT Time Calculation (min) (ACUTE ONLY): 20 min  Charges:  $Gait Training: 8-22 mins                    G Codes:       Colin Broach PT, DPT  [phone]  07/29/2016, 4:37 PM

## 2016-07-29 NOTE — Evaluation (Signed)
Occupational Therapy Evaluation Patient Details Name: Leslie Ball MRN: [id number] DOB: December 10, 1940 Today's Date: 07/29/2016    History of Present Illness pt is a 76 yo female admitted for elective L THA. PMH significant for R eye blindness, GERD, HTN, RA, L TKA 2015 and R TKA 2014.    Clinical Impression   Pt reports she was independent with ADL PTA. Currently pt requires min assist for ADL and functional mobility with the exception of mod assist for LB ADL. Began ADL, safety, and hip education. Pt planning to d/c home alone; family to supervise 24/7 for ~1 week initially. Recommending HHOT for follow up to maximize independence and safety with ADL and functional mobility upon return home. Pt would benefit from continued skilled OT to address established goals.    Follow Up Recommendations  Home health OT;Supervision/Assistance - 24 hour (initially)    Equipment Recommendations  Tub/shower seat;Tub/shower bench (pending progress with tub transfer)    Recommendations for Other Services       Precautions / Restrictions Precautions Precautions: None Restrictions Weight Bearing Restrictions: Yes LLE Weight Bearing: Weight bearing as tolerated      Mobility Bed Mobility Overal bed mobility: Needs Assistance Bed Mobility: Supine to Sit;Sit to Supine     Supine to sit: Mod assist Sit to supine: Min assist   General bed mobility comments: Assist for LEs and trunk elevation from supine to sit. Cues for hand placement and technique.   Transfers Overall transfer level: Needs assistance Equipment used: Rolling walker (2 wheeled) Transfers: Sit to/from Stand Sit to Stand: Min assist Stand pivot transfers: Min assist       General transfer comment: Min assist to boost up and for standing balance. Cues for hand placement and technique with RW.    Balance Overall balance assessment: Needs assistance Sitting-balance support: Feet supported;Bilateral upper extremity  supported Sitting balance-Leahy Scale: Fair     Standing balance support: Single extremity supported Standing balance-Leahy Scale: Fair Standing balance comment: Able to complete peri care in standing with one hand supported                            ADL Overall ADL's : Needs assistance/impaired Eating/Feeding: Set up;Sitting   Grooming: Minimal assistance;Standing Grooming Details (indicate cue type and reason): for standing balance Upper Body Bathing: Min guard;Sitting   Lower Body Bathing: Moderate assistance;Sit to/from stand   Upper Body Dressing : Min guard;Sitting   Lower Body Dressing: Moderate assistance;Sit to/from stand   Toilet Transfer: Minimal assistance;Ambulation;BSC;RW   Toileting- Clothing Manipulation and Hygiene: Set up;Sit to/from stand;Minimal assistance Toileting - Clothing Manipulation Details (indicate cue type and reason): assist for standing balance     Functional mobility during ADLs: Minimal assistance;Rolling walker       Vision         Perception     Praxis      Pertinent Vitals/Pain Pain Assessment: 0-10 Pain Score: 7  Pain Location: L hip Pain Descriptors / Indicators: Grimacing;Guarding;Aching;Sore Pain Intervention(s): Monitored during session     Hand Dominance Right   Extremity/Trunk Assessment Upper Extremity Assessment Upper Extremity Assessment: Generalized weakness   Lower Extremity Assessment Lower Extremity Assessment: Defer to PT evaluation LLE Deficits / Details: Pt with normal post op pain and weakness. At least 3/5 ankle and 2/5 knee and hip per gross functional assessment       Communication Communication Communication: Prefers language other than English   Cognition Arousal/Alertness: Awake/alert  Behavior During Therapy: WFL for tasks assessed/performed Overall Cognitive Status: Within Functional Limits for tasks assessed                     General Comments       Exercises        Shoulder Instructions      Home Living Family/patient expects to be discharged to:: Private residence Living Arrangements: Alone Available Help at Discharge: Family Type of Home: Apartment Home Access: Stairs to enter Secretary/administrator of Steps: 1 Entrance Stairs-Rails: None Home Layout: One level     Bathroom Shower/Tub: Chief Strategy Officer: Standard Bathroom Accessibility: Yes   Home Equipment: Environmental consultant - 2 wheels;Bedside commode   Additional Comments: will hire help or have her son and his wife help at discahrge. Doesn't want Rehab      Prior Functioning/Environment Level of Independence: Independent with assistive device(s)        Comments: Has weakness in upper extremities. Can use RW        OT Problem List: Decreased strength;Decreased range of motion;Impaired balance (sitting and/or standing);Decreased knowledge of use of DME or AE;Decreased knowledge of precautions;Pain      OT Treatment/Interventions: Self-care/ADL training;DME and/or AE instruction;Therapeutic activities;Patient/family education;Balance training    OT Goals(Current goals can be found in the care plan section) Acute Rehab OT Goals Patient Stated Goal: to go home with help OT Goal Formulation: With patient/family Time For Goal Achievement: 08/12/16 Potential to Achieve Goals: Good ADL Goals Pt Will Perform Lower Body Bathing: with supervision;sit to/from stand Pt Will Perform Lower Body Dressing: with supervision;sit to/from stand Pt Will Transfer to Toilet: with supervision;ambulating;bedside commode (over toilet) Pt Will Perform Toileting - Clothing Manipulation and hygiene: with supervision;sit to/from stand Pt Will Perform Tub/Shower Transfer: Tub transfer;with supervision;ambulating;rolling walker (tub bench vs shower chair)  OT Frequency: Min 2X/week   Barriers to D/C:            Co-evaluation              End of Session Equipment Utilized During  Treatment: Engineer, water Communication: Mobility status  Activity Tolerance: Patient tolerated treatment well Patient left: in bed;with call bell/phone within reach;with family/visitor present  OT Visit Diagnosis: Other abnormalities of gait and mobility (R26.89);Pain Pain - Right/Left: Left Pain - part of body: Hip                ADL either performed or assessed with clinical judgement  Time: 1137-1200 OT Time Calculation (min): 23 min Charges:  OT General Charges $OT Visit: 1 Procedure OT Evaluation $OT Eval Moderate Complexity: 1 Procedure OT Treatments $Self Care/Home Management : 8-22 mins G-Codes:     Fredric Mare A. Brett Albino, M.S., OTR/L Pager: [phone]  Gaye Alken 07/29/2016, 2:06 PM

## 2016-07-29 NOTE — Evaluation (Signed)
Physical Therapy Evaluation Patient Details Name: Leslie Ball MRN: [id number] DOB: [birth year] Today's Date: 07/29/2016   History of Present Illness  pt is a 76 yo female admitted for elective L THA. PMH significant for R eye blindness, GERD, HTN, RA, L TKA 2015 and R TKA 2014.   Clinical Impression  Pt is POD 1 following the above procedure. Prior to admission, pt was living alone in a apartment and receiving occasional assistance from her son and daughter in law. Pt requires Min a for majority of mobility this session and is limited by pain. Pt wants to return home and is planning on having her son to assist and possibly hire outside help. Pt will benefit from continued acute PT services in order to address below deficits to assist a a smooth transition home.     Follow Up Recommendations Home health PT;Supervision for mobility/OOB    Equipment Recommendations  Rolling walker with 5" wheels;3in1 (PT)    Recommendations for Other Services       Precautions / Restrictions Precautions Precautions: None Restrictions Weight Bearing Restrictions: Yes LLE Weight Bearing: Weight bearing as tolerated      Mobility  Bed Mobility Overal bed mobility: Needs Assistance Bed Mobility: Supine to Sit     Supine to sit: Min assist     General bed mobility comments: Min A to bring LLE EOB and cues for using railings in order to assist bringing self into sitting  Transfers Overall transfer level: Needs assistance Equipment used: Rolling walker (2 wheeled) Transfers: Sit to/from [employer] Sit to Stand: Min assist Stand pivot transfers: Min assist       General transfer comment: Min A for safety from EOB with RW for sit to stand and pivot transfer. Cues for sequencing  Ambulation/Gait                Stairs            Wheelchair Mobility    Modified Rankin (Stroke Patients Only)       Balance                                              Pertinent Vitals/Pain Pain Assessment: Faces Faces Pain Scale: Hurts whole lot Pain Location: Left hip with mobility Pain Descriptors / Indicators: Moaning;Grimacing;Guarding Pain Intervention(s): Monitored during session;RN gave pain meds during session;Limited activity within patient's tolerance;Repositioned    Home Living Family/patient expects to be discharged to:: Private residence Living Arrangements: Alone Available Help at Discharge: Family Type of Home: Apartment Home Access: Stairs to enter Entrance Stairs-Rails: None Entrance Stairs-Number of Steps: 1 Home Layout: One level Home Equipment: Environmental consultant - 2 wheels;Bedside commode Additional Comments: will hire help or have her son and his wife help at discahrge. Doesn't want Rehab    Prior Function Level of Independence: Independent with assistive device(s)         Comments: Has weakness in upper extremities. Can use RW     Hand Dominance   Dominant Hand: Right    Extremity/Trunk Assessment   Upper Extremity Assessment Upper Extremity Assessment: Defer to OT evaluation    Lower Extremity Assessment Lower Extremity Assessment: LLE deficits/detail LLE Deficits / Details: Pt with normal post op pain and weakness. At least 3/5 ankle and 2/5 knee and hip per gross functional assessment       Communication  Communication: Prefers language other than English  Cognition Arousal/Alertness: Awake/alert Behavior During Therapy: WFL for tasks assessed/performed Overall Cognitive Status: Within Functional Limits for tasks assessed                      General Comments      Exercises     Assessment/Plan    PT Assessment Patient needs continued PT services  PT Problem List Decreased strength;Decreased activity tolerance;Decreased balance;Decreased mobility;Decreased range of motion;Decreased knowledge of use of DME;Pain       PT Treatment Interventions DME instruction;Gait  training;Functional mobility training;Stair training;Therapeutic activities;Therapeutic exercise;Balance training;Patient/family education    PT Goals (Current goals can be found in the Care Plan section)  Acute Rehab PT Goals Patient Stated Goal: to go home with help PT Goal Formulation: With patient Time For Goal Achievement: 08/05/16 Potential to Achieve Goals: Good    Frequency 7X/week   Barriers to discharge        Co-evaluation               End of Session Equipment Utilized During Treatment: Gait belt Activity Tolerance: Patient limited by pain Patient left: in chair;with call bell/phone within reach;with nursing/sitter in room Nurse Communication: Mobility status PT Visit Diagnosis: Difficulty in walking, not elsewhere classified (R26.2);Pain Pain - Right/Left: Left Pain - part of body: Hip         Time: 8144-8185 PT Time Calculation (min) (ACUTE ONLY): 13 min   Charges:   PT Evaluation $PT Eval Moderate Complexity: 1 Procedure     PT G Codes:         Colin Broach PT, DPT  [phone]  07/29/2016, 12:58 PM

## 2016-07-29 NOTE — Discharge Instructions (Signed)
Information on my medicine - XARELTO® (Rivaroxaban) ° °This medication education was reviewed with me or my healthcare representative as part of my discharge preparation.  The pharmacist that spoke with me during my hospital stay was:  Icy Fuhrmann P, RPH ° °Why was Xarelto® prescribed for you? °Xarelto® was prescribed for you to reduce the risk of blood clots forming after orthopedic surgery. The medical term for these abnormal blood clots is venous thromboembolism (VTE). ° °What do you need to know about xarelto® ? °Take your Xarelto® ONCE DAILY at the same time every day. °You may take it either with or without food. ° °If you have difficulty swallowing the tablet whole, you may crush it and mix in applesauce just prior to taking your dose. ° °Take Xarelto® exactly as prescribed by your doctor and DO NOT stop taking Xarelto® without talking to the doctor who prescribed the medication.  Stopping without other VTE prevention medication to take the place of Xarelto® may increase your risk of developing a clot. ° °After discharge, you should have regular check-up appointments with your healthcare provider that is prescribing your Xarelto®.   ° °What do you do if you miss a dose? °If you miss a dose, take it as soon as you remember on the same day then continue your regularly scheduled once daily regimen the next day. Do not take two doses of Xarelto® on the same day.  ° °Important Safety Information °A possible side effect of Xarelto® is bleeding. You should call your healthcare provider right away if you experience any of the following: °? Bleeding from an injury or your nose that does not stop. °? Unusual colored urine (red or dark brown) or unusual colored stools (red or black). °? Unusual bruising for unknown reasons. °? A serious fall or if you hit your head (even if there is no bleeding). ° °Some medicines may interact with Xarelto® and might increase your risk of bleeding while on Xarelto®. To help avoid  this, consult your healthcare provider or pharmacist prior to using any new prescription or non-prescription medications, including herbals, vitamins, non-steroidal anti-inflammatory drugs (NSAIDs) and supplements. ° °This website has more information on Xarelto®: www.xarelto.com. ° ° °

## 2016-07-29 NOTE — Progress Notes (Signed)
Patient ID: Leslie Ball, female   DOB: 1941/03/26, 76 y.o.   MRN: [id number] Postoperative day 1 left hip total hip arthroplasty. Patient complains of some mild pain on the left hip. Plan for physical therapy weightbearing as tolerated. Patient states she has difficulty going bathroom we will have her bladder scanned may require I&O cath.

## 2016-07-30 LAB — CBC
HCT: 25.2 % — ABNORMAL LOW (ref 36.0–46.0)
Hemoglobin: 7.6 g/dL — ABNORMAL LOW (ref 12.0–15.0)
MCH: 25 pg — AB (ref 26.0–34.0)
MCHC: 30.2 g/dL (ref 30.0–36.0)
MCV: 82.9 fL (ref 78.0–100.0)
Platelets: 289 10*3/uL (ref 150–400)
RBC: 3.04 MIL/uL — AB (ref 3.87–5.11)
RDW: 17.8 % — ABNORMAL HIGH (ref 11.5–15.5)
WBC: 7.3 10*3/uL (ref 4.0–10.5)

## 2016-07-30 LAB — TYPE AND SCREEN
ABO/RH(D): O POS
ANTIBODY SCREEN: NEGATIVE
UNIT DIVISION: 0

## 2016-07-30 LAB — URINALYSIS, ROUTINE W REFLEX MICROSCOPIC
Bilirubin Urine: NEGATIVE
Glucose, UA: NEGATIVE mg/dL
KETONES UR: NEGATIVE mg/dL
Leukocytes, UA: NEGATIVE
Nitrite: NEGATIVE
PH: 6 (ref 5.0–8.0)
PROTEIN: NEGATIVE mg/dL
Specific Gravity, Urine: 1.012 (ref 1.005–1.030)

## 2016-07-30 LAB — BPAM RBC
BLOOD PRODUCT EXPIRATION DATE: 201804052359
ISSUE DATE / TIME: 201803101514
Unit Type and Rh: 5100

## 2016-07-30 MED ORDER — POLYETHYLENE GLYCOL 3350 17 G PO PACK
17.0000 g | PACK | Freq: Every day | ORAL | Status: DC
  Administered 2016-07-30 – 2016-08-01 (×3): 17 g via ORAL
  Filled 2016-07-30 (×4): qty 1

## 2016-07-30 MED ORDER — HYDROCHLOROTHIAZIDE 12.5 MG PO CAPS
12.5000 mg | ORAL_CAPSULE | Freq: Every day | ORAL | Status: DC
  Administered 2016-07-31 – 2016-08-01 (×2): 12.5 mg via ORAL
  Filled 2016-07-30 (×2): qty 1

## 2016-07-30 MED ORDER — OXYBUTYNIN CHLORIDE 5 MG PO TABS
5.0000 mg | ORAL_TABLET | Freq: Two times a day (BID) | ORAL | Status: DC
  Administered 2016-07-30 – 2016-08-01 (×4): 5 mg via ORAL
  Filled 2016-07-30 (×4): qty 1

## 2016-07-30 NOTE — Progress Notes (Signed)
Orthopedic Tech Progress Note Patient Details:  Leslie Ball 1940-09-08 [id number]  CPM Left Knee CPM Left Knee: On Left Knee Flexion (Degrees): 40 Left Knee Extension (Degrees): 0   Saul Fordyce 07/30/2016, 5:54 PM

## 2016-07-30 NOTE — Progress Notes (Signed)
Patient ID: Leslie Ball, female   DOB: 1941/04/20, 76 y.o.   MRN: [id number] Postoperative day 2 total hip arthroplasty on the left. Patient states she still having difficulty with urination. I will ordered a bladder scan this morning. I if residual volumes greater than 350 mL.

## 2016-07-30 NOTE — Plan of Care (Signed)
Problem: Bowel/Gastric: Goal: Will not experience complications related to bowel motility Outcome: Progressing No bowel issues reported  Problem: Activity: Goal: Ability to avoid complications of mobility impairment will improve Outcome: Progressing OOB to [facility] with assistance, tolerates well  Goal: Will remain free from falls Outcome: Progressing Safety precautions maintained, no fall noted this shift  Problem: Education: Goal: Knowledge of the prescribed therapeutic regimen will improve Outcome: Progressing SCDs are on no s/s of dvt noted  Problem: Physical Regulation: Goal: Postoperative complications will be avoided or minimized Outcome: Not Progressing No post op. complications noted  Problem: Pain Management: Goal: Pain level will decrease with appropriate interventions Outcome: Progressing Medicated twice for pain, resting in the bed with eyes closed at present time, no acute distress noted

## 2016-07-30 NOTE — Progress Notes (Signed)
Patient c/o urinary urgency. MD notified. Patient to have a urinalysis. Patient to follow up with primary care provider.

## 2016-07-30 NOTE — Progress Notes (Signed)
Physical Therapy Treatment Patient Details Name: Leslie Ball MRN: [id number] DOB: Dec 12, 1940 Today's Date: 07/30/2016    History of Present Illness pt is a 76 yo female admitted for elective L THA. PMH significant for R eye blindness, GERD, HTN, RA, L TKA 2015 and R TKA 2014.     PT Comments    Patient tolerated short distance gait this am. Overall min/mod A for mobility. Posterior lean upon initial stand from recliner. Continue to progress as tolerated.   Follow Up Recommendations  Home health PT;Supervision for mobility/OOB     Equipment Recommendations  Rolling walker with 5" wheels;3in1 (PT)    Recommendations for Other Services       Precautions / Restrictions Precautions Precautions: None Restrictions Weight Bearing Restrictions: Yes LLE Weight Bearing: Weight bearing as tolerated    Mobility  Bed Mobility Overal bed mobility: Needs Assistance Bed Mobility: Sit to Supine       Sit to supine: Mod assist   General bed mobility comments: assist to bring bilat LE inot bed; cues for sequencing and technique  Transfers Overall transfer level: Needs assistance Equipment used: Rolling walker (2 wheeled) Transfers: Sit to/from Stand Sit to Stand: Mod assist;Min assist         General transfer comment: min from [facility] and mod from recliner; assist to power up into standing and gain balance upon stand; pt with posterior lean in standing; cues for hand/foot placement  Ambulation/Gait Ambulation/Gait assistance: Min assist Ambulation Distance (Feet):  (46ft then 79ft) Assistive device: Rolling walker (2 wheeled) Gait Pattern/deviations: Step-through pattern;Decreased weight shift to right;Decreased stride length;Decreased dorsiflexion - right;Decreased dorsiflexion - left;Trunk flexed Gait velocity: decreased   General Gait Details: cues for sequencing, safe proximity of RW, and posture; pt with short step lengths and decreased step height   Stairs             Wheelchair Mobility    Modified Rankin (Stroke Patients Only)       Balance Overall balance assessment: Needs assistance Sitting-balance support: Feet supported;No upper extremity supported Sitting balance-Leahy Scale: Fair     Standing balance support: Single extremity supported Standing balance-Leahy Scale: Fair Standing balance comment: Able to complete peri care in standing with one hand supported                    Cognition Arousal/Alertness: Awake/alert Behavior During Therapy: WFL for tasks assessed/performed Overall Cognitive Status: Within Functional Limits for tasks assessed                      Exercises      General Comments        Pertinent Vitals/Pain Pain Assessment: Faces Faces Pain Scale: Hurts little more Pain Location: L hip Pain Descriptors / Indicators: Grimacing;Guarding;Aching Pain Intervention(s): Limited activity within patient's tolerance;Monitored during session;Premedicated before session;Repositioned    Home Living                      Prior Function            PT Goals (current goals can now be found in the care plan section) Progress towards PT goals: Progressing toward goals    Frequency    7X/week      PT Plan Current plan remains appropriate    Co-evaluation             End of Session Equipment Utilized During Treatment: Gait belt Activity Tolerance: Patient limited by pain Patient left:  with call bell/phone within reach;in bed Nurse Communication: Mobility status PT Visit Diagnosis: Difficulty in walking, not elsewhere classified (R26.2);Pain Pain - Right/Left: Left Pain - part of body: Hip     Time: 2505-3976 PT Time Calculation (min) (ACUTE ONLY): 29 min  Charges:  $Gait Training: 8-22 mins $Therapeutic Activity: 8-22 mins                    G Codes:       Derek Mound, PTA Pager: [phone]   07/30/2016, 11:30 AM

## 2016-07-30 NOTE — Progress Notes (Signed)
Subjective: Pt stable - sitting in chair   Objective: Vital signs in last 24 hours: Temp:  [98.1 F (36.7 C)-99.4 F (37.4 C)] 99.4 F (37.4 C) (03/11 0428) Pulse Rate:  [61-83] 80 (03/11 0428) Resp:  [14-17] 15 (03/10 1726) BP: (93-128)/(41-58) 111/51 (03/11 0428) SpO2:  [93 %-100 %] 98 % (03/11 0926)  Intake/Output from previous day: 03/10 0701 - 03/11 0700 In: 1557.5 [P.O.:600; I.V.:647.5; Blood:310] Out: -  Intake/Output this shift: No intake/output data recorded.  Exam:  Dorsiflexion/Plantar flexion intact  Labs:  Recent Labs  07/29/16 0703 07/29/16 1855 07/30/16 0411  HGB 7.3* 7.4* 7.6*    Recent Labs  07/29/16 0703 07/29/16 1855 07/30/16 0411  WBC 8.2  --  7.3  RBC 2.97*  --  3.04*  HCT 24.0* 23.9* 25.2*  PLT 316  --  289    Recent Labs  07/29/16 0703  NA 140  K 3.5  CL 107  CO2 26  BUN 12  CREATININE 0.69  GLUCOSE 114*  CALCIUM 8.1*   No results for input(s): LABPT, INR in the last 72 hours.  Assessment/Plan: Plan for ua which is pending Possible dc Tuesday hgb ok after 1 u prbc Start cpm to get hip moving   [employer] 07/30/2016, 2:08 PM

## 2016-07-30 NOTE — Progress Notes (Signed)
Physical Therapy Treatment Patient Details Name: Leslie Ball MRN: [id number] DOB: Nov 30, 1940 Today's Date: 07/30/2016    History of Present Illness pt is a 76 yo female admitted for elective L THA. PMH significant for R eye blindness, GERD, HTN, RA, L TKA 2015 and R TKA 2014.     PT Comments    This session focused on HEP. Pt limited by pain and required assist to complete some therex. Pt continues to have most difficulty with transitional movements. Pt with limited ROM of R shoulder and pain in hands when using RW. Continue to progress as tolerated.    Follow Up Recommendations  Home health PT;Supervision for mobility/OOB     Equipment Recommendations  Rolling walker with 5" wheels;3in1 (PT)    Recommendations for Other Services       Precautions / Restrictions Precautions Precautions: None Restrictions Weight Bearing Restrictions: Yes LLE Weight Bearing: Weight bearing as tolerated    Mobility  Bed Mobility Overal bed mobility: Needs Assistance Bed Mobility: Supine to Sit     Supine to sit: Mod assist Sit to supine: Mod assist   General bed mobility comments: assist to mobilize bilat LE and elevate trunk into sitting; pt with limited R shoulder ROM   Transfers Overall transfer level: Needs assistance Equipment used: Rolling walker (2 wheeled) Transfers: Sit to/from Stand Sit to Stand: Min assist         General transfer comment: assist to stand from EOB and BSC; cues for safe hand placement  Ambulation/Gait Ambulation/Gait assistance: Min assist Ambulation Distance (Feet): 10 Feet Assistive device: Rolling walker (2 wheeled) Gait Pattern/deviations: Step-through pattern;Decreased weight shift to right;Decreased stride length;Decreased dorsiflexion - right;Decreased dorsiflexion - left;Trunk flexed Gait velocity: decreased   General Gait Details: cues for sequencing and proximity of RW   Stairs            Wheelchair Mobility    Modified  Rankin (Stroke Patients Only)       Balance Overall balance assessment: Needs assistance Sitting-balance support: Feet supported;No upper extremity supported Sitting balance-Leahy Scale: Fair     Standing balance support: Single extremity supported Standing balance-Leahy Scale: Fair Standing balance comment: Able to complete peri care in standing with one hand supported                    Cognition Arousal/Alertness: Awake/alert Behavior During Therapy: WFL for tasks assessed/performed Overall Cognitive Status: Within Functional Limits for tasks assessed                      Exercises Total Joint Exercises Quad Sets: AROM;Both;10 reps Short Arc Quad: AROM;Left;10 reps Heel Slides: AAROM;Left;10 reps Hip ABduction/ADduction: AAROM;Left;10 reps;AROM;Standing Long Arc Quad: AROM;Left;10 reps Knee Flexion: AROM;Left;10 reps;Standing Marching in Standing: AROM;Left;10 reps;Standing    General Comments General comments (skin integrity, edema, etc.): son and friend present      Pertinent Vitals/Pain Pain Assessment: Faces Faces Pain Scale: Hurts even more Pain Location: L hip Pain Descriptors / Indicators: Grimacing;Guarding;Aching Pain Intervention(s): Limited activity within patient's tolerance;Monitored during session;Repositioned;RN gave pain meds during session    Home Living                      Prior Function            PT Goals (current goals can now be found in the care plan section) Progress towards PT goals: Progressing toward goals    Frequency    7X/week  PT Plan Current plan remains appropriate    Co-evaluation             End of Session Equipment Utilized During Treatment: Gait belt Activity Tolerance: Patient limited by pain Patient left: with call bell/phone within reach;in bed;with family/visitor present Nurse Communication: Mobility status PT Visit Diagnosis: Difficulty in walking, not elsewhere  classified (R26.2);Pain Pain - Right/Left: Left Pain - part of body: Hip     Time: 0867-6195 PT Time Calculation (min) (ACUTE ONLY): 37 min  Charges:  $Gait Training: 8-22 mins $Therapeutic Exercise: 8-22 mins                    G Codes:       Derek Mound, PTA Pager: [phone]   07/30/2016, 4:41 PM

## 2016-07-31 ENCOUNTER — Encounter (HOSPITAL_COMMUNITY): Payer: Self-pay | Admitting: General Practice

## 2016-07-31 MED ORDER — TRAMADOL HCL 50 MG PO TABS: 50.0000 mg | ORAL_TABLET | Freq: Two times a day (BID) | ORAL | 1 refills | Status: DC | PRN

## 2016-07-31 MED ORDER — OXYBUTYNIN CHLORIDE 5 MG PO TABS: 5.0000 mg | ORAL_TABLET | Freq: Two times a day (BID) | ORAL | 0 refills | Status: DC

## 2016-07-31 MED ORDER — RIVAROXABAN 10 MG PO TABS: 10.0000 mg | ORAL_TABLET | Freq: Every day | ORAL | Status: DC

## 2016-07-31 MED ORDER — OXYCODONE HCL 5 MG PO TABS: 5.0000 mg | ORAL_TABLET | ORAL | 0 refills | Status: DC | PRN

## 2016-07-31 NOTE — Care Management Note (Addendum)
Case Management Note  Patient Details  Name: Leslie Ball MRN: [id number] Date of Birth: 1941-02-22  Subjective/Objective:    76 yr old female s/p left total hip arthroplasty.                Action/Plan: Case manager spoke with patient concerning Home health and DME needs. Patient was preoperatively setup with [facility], no changes. Patient states she has a rolling walker and 3in1 from previous surgery. Patient also states she lives alone but has a friend that will come stay with her for 10 days.     Expected Discharge Date:   08/01/16               Expected Discharge Plan:  Home w Home Health Services  In-House Referral:  NA  Discharge planning Services  CM Consult  Post Acute Care Choice:  Home Health, Durable Medical Equipment Choice offered to:  Patient  DME Arranged:  Tub bench DME Agency:  [facility]  HH Arranged:  PT, OT [facility] Agency: [facility] Status of Service:  Completed, signed off  If discussed at [employer], dates discussed:    Additional Comments:  Durenda Guthrie, RN 07/31/2016, 12:26 PM

## 2016-07-31 NOTE — Progress Notes (Signed)
Physical Therapy Treatment Patient Details Name: Leslie Ball MRN: [id number] DOB: [birth year] Today's Date: 07/31/2016    History of Present Illness pt is a 76 yo female admitted for elective L THA. PMH significant for R eye blindness, GERD, HTN, RA, L TKA 2015 and R TKA 2014.     PT Comments    Pt has the most difficulty with bed mobility due to LE pain and limited shoulder ROM   Follow Up Recommendations  Home health PT;Supervision/Assistance - 24 hour;Supervision for mobility/OOB     Equipment Recommendations  Rolling walker with 5" wheels;3in1 (PT)    Recommendations for Other Services       Precautions / Restrictions Precautions Precautions: None Restrictions Weight Bearing Restrictions: Yes LLE Weight Bearing: Weight bearing as tolerated    Mobility  Bed Mobility Overal bed mobility: Needs Assistance      Supine to sit: Mod assist    General bed mobility comments: pt with UE deficits and needs (A) for BIL LE to position on bed. Pt with limited shoulder flexion  Transfers Overall transfer level: Needs assistance Equipment used: Rolling walker (2 wheeled) Transfers: Sit to/from Stand Sit to Stand: Min assist         General transfer comment: multi-modal cueing for hand placement  Ambulation/Gait Ambulation/Gait assistance: Min guard Ambulation Distance (Feet): 38 Feet Assistive device: Rolling walker (2 wheeled) Gait Pattern/deviations: Decreased step length - right;Decreased step length - left;Shuffle;Trunk flexed Gait velocity: decreased   General Gait Details: shuffled gait pattern.  Difficulty with backwards gait when in front of chair going to sit.   Stairs            Wheelchair Mobility    Modified Rankin (Stroke Patients Only)       Balance     Sitting balance-Leahy Scale: Fair     Standing balance support: During functional activity Standing balance-Leahy Scale: Fair Standing balance comment: Able to complete peri  care in standing with one hand supported                    Cognition Arousal/Alertness: Awake/alert Behavior During Therapy: WFL for tasks assessed/performed Overall Cognitive Status: Within Functional Limits for tasks assessed                      Exercises      General Comments        Pertinent Vitals/Pain Pain Assessment: Faces Pain Score: 6  Faces Pain Scale: Hurts even more Pain Location: L Leg Pain Descriptors / Indicators: Guarding Pain Intervention(s): Monitored during session;Premedicated before session;Repositioned    Home Living Family/patient expects to be discharged to:: Unsure Living Arrangements: Alone                  Prior Function            PT Goals (current goals can now be found in the care plan section) Acute Rehab PT Goals Patient Stated Goal: to go home with help PT Goal Formulation: With patient Time For Goal Achievement: 08/05/16 Potential to Achieve Goals: Good Progress towards PT goals: Progressing toward goals    Frequency    7X/week      PT Plan Current plan remains appropriate    Co-evaluation             End of Session Equipment Utilized During Treatment: Gait belt Activity Tolerance: Patient tolerated treatment well Patient left: in chair;with call bell/phone within reach   PT Visit Diagnosis:  Difficulty in walking, not elsewhere classified (R26.2);Pain Pain - Right/Left: Left Pain - part of body: Hip     Time: 7322-0254 PT Time Calculation (min) (ACUTE ONLY): 28 min  Charges:  $Gait Training: 8-22 mins $Therapeutic Activity: 8-22 mins                    G Codes:       Leslie Ball 07/31/2016, 10:51 AM

## 2016-07-31 NOTE — Progress Notes (Addendum)
Physical Therapy Treatment Patient Details Name: Leslie Ball MRN: [id number] DOB: Jun 01, 1940 Today's Date: 07/31/2016    History of Present Illness pt is a 76 yo female admitted for elective L THA. PMH significant for R eye blindness, GERD, HTN, RA, L TKA 2015 and R TKA 2014.     PT Comments    Pt more fatigued than in earlier session of the day, but did initiate stair training for the 1 step entry she has at home.  Pt continues to have the most difficulty with bed mobility requiring MOD A and transfers/ambulates with min/guard.  She needs to be encouraged to try harder things and ambulate farther. Issued illustrated HEP. Con't to recommend HHPT with aide.    Follow Up Recommendations  Home health PT;Supervision/Assistance - 24 hour;Supervision for mobility/OOB     Equipment Recommendations  Rolling walker with 5" wheels;3in1 (PT)    Recommendations for Other Services       Precautions / Restrictions Precautions Precautions: None Restrictions Weight Bearing Restrictions: Yes LLE Weight Bearing: Weight bearing as tolerated    Mobility  Bed Mobility Overal bed mobility: Needs Assistance Bed Mobility: Supine to Sit     Supine to sit: Mod assist    General bed mobility comments: Pt able to initiate better with legs, but still needs with MOD A for legs and trunk with decreased shoulder B ROM  Transfers Overall transfer level: Needs assistance Equipment used: Rolling walker (2 wheeled) Transfers: Sit to/from Stand Sit to Stand: Min guard         General transfer comment: Pt able to stand from bed and BSC with min/guard  Ambulation/Gait Ambulation/Gait assistance: Min guard Ambulation Distance (Feet): 10 Feet Assistive device: Rolling walker (2 wheeled) Gait Pattern/deviations: Decreased step length - right;Decreased step length - left;Shuffle Gait velocity: decreased   General Gait Details: decreased B step length. Limited gait in room.   Stairs Stairs:  Yes   Stair Management: Backwards;Forwards;With walker Number of Stairs: 1 General stair comments: training curb brought to pt's room and practiced forward and backwards with MIN A.  multi-modal cueing for technique and proper sequence  Wheelchair Mobility    Modified Rankin (Stroke Patients Only)       Balance     Sitting balance-Leahy Scale: Fair     Standing balance support: During functional activity Standing balance-Leahy Scale: Fair Standing balance comment: Pt able to stand without UE support                    Cognition Arousal/Alertness: Awake/alert Behavior During Therapy: WFL for tasks assessed/performed Overall Cognitive Status: Within Functional Limits for tasks assessed                      Exercises Total Joint Exercises Ankle Circles/Pumps: AROM;Both;10 reps;Supine Quad Sets: AROM;Both;10 reps Heel Slides: AROM;Left;5 reps;Supine Hip ABduction/ADduction: AAROM;Left;10 reps;Supine    General Comments        Pertinent Vitals/Pain Pain Assessment: Faces Faces Pain Scale: Hurts even more Pain Location: L knee Pain Descriptors / Indicators: Guarding;Grimacing Pain Intervention(s): Limited activity within patient's tolerance;Monitored during session;Repositioned    Home Living                      Prior Function            PT Goals (current goals can now be found in the care plan section) Acute Rehab PT Goals Patient Stated Goal: to go home with help PT  Goal Formulation: With patient Time For Goal Achievement: 08/05/16 Potential to Achieve Goals: Good Progress towards PT goals: Progressing toward goals    Frequency    7X/week      PT Plan Current plan remains appropriate    Co-evaluation             End of Session Equipment Utilized During Treatment: Gait belt Activity Tolerance: Patient tolerated treatment well Patient left: in chair;with call bell/phone within reach (with lunch) Nurse Communication:  Mobility status PT Visit Diagnosis: Difficulty in walking, not elsewhere classified (R26.2);Pain Pain - Right/Left: Left Pain - part of body: Hip     Time: 8101-7510 PT Time Calculation (min) (ACUTE ONLY): 31 min  Charges:  $Gait Training: 8-22 mins $Therapeutic Exercise: 8-22 mins                    G Codes:       Arron Mcnaught LUBECK 07/31/2016, 1:27 PM

## 2016-07-31 NOTE — Progress Notes (Signed)
Subjective: Pt stable - mobilizing slowly - has assist at home   Objective: Vital signs in last 24 hours: Temp:  [98.8 F (37.1 C)] 98.8 F (37.1 C) (03/11 2100) Pulse Rate:  [69-82] 69 (03/12 1050) Resp:  [16] 16 (03/11 2100) BP: (106-118)/(58-79) 106/79 (03/12 1050) SpO2:  [98 %] 98 % (03/11 2100)  Intake/Output from previous day: 03/11 0701 - 03/12 0700 In: 720 [P.O.:720] Out: -  Intake/Output this shift: Total I/O In: 240 [P.O.:240] Out: -   Exam:  Intact pulses distally  Labs:  Recent Labs  07/29/16 0703 07/29/16 1855 07/30/16 0411  HGB 7.3* 7.4* 7.6*    Recent Labs  07/29/16 0703 07/29/16 1855 07/30/16 0411  WBC 8.2  --  7.3  RBC 2.97*  --  3.04*  HCT 24.0* 23.9* 25.2*  PLT 316  --  289    Recent Labs  07/29/16 0703  NA 140  K 3.5  CL 107  CO2 26  BUN 12  CREATININE 0.69  GLUCOSE 114*  CALCIUM 8.1*   No results for input(s): LABPT, INR in the last 72 hours.  Assessment/Plan: Plan for dc to home either Tuesday or Wednesday rx on chart   Burnard Bunting 07/31/2016, 12:29 PM

## 2016-07-31 NOTE — Progress Notes (Signed)
Occupational Therapy Treatment Patient Details Name: Leslie Ball MRN: [id number] DOB: 1940/11/23 Today's Date: 07/31/2016    History of present illness pt is a 76 yo female admitted for elective L THA. PMH significant for R eye blindness, GERD, HTN, RA, L TKA 2015 and R TKA 2014.    OT comments  Pt currently is unable to complete bench transfer and advised sponge bath at this time. Pt currently requires (A) for all basic transfers. Pt will need min (A) to d/c home at this time. Pt requires mod/ max (A) for bed mobility due to limited UB ROM and max cues for sequence for proper sequence.    Follow Up Recommendations  Home health OT;Supervision/Assistance - 24 hour    Equipment Recommendations  Tub/shower bench    Recommendations for Other Services      Precautions / Restrictions Precautions Precautions: None Restrictions Weight Bearing Restrictions: Yes LLE Weight Bearing: Weight bearing as tolerated       Mobility Bed Mobility Overal bed mobility: Needs Assistance Bed Mobility: Sit to Supine       Sit to supine: Max assist   General bed mobility comments: pt with UE deficits and needs (A) for BIL LE to position on bed. Pt with limited shoulder flexion  Transfers Overall transfer level: Needs assistance Equipment used: Rolling walker (2 wheeled) Transfers: Sit to/from Stand Sit to Stand: Min assist         General transfer comment: pt needs (A) to power up from the chair / bench/ bed / 3n1    Balance                                   ADL Overall ADL's : Needs assistance/impaired                         Toilet Transfer: Minimal assistance   Toileting- Clothing Manipulation and Hygiene: Min guard   Tub/ Shower Transfer: Tub transfer;Tub Building surveyor Details (indicate cue type and reason): Pt was able to transfer to the bench but unable to cross the tub surface. Pt will require a sponge bath until she is able to slide  on bench. Recommend HHOT to further assess Functional mobility during ADLs: Minimal assistance General ADL Comments: pt with urgency to void bladder and needed to have (A) with transfer in the chair to bathroom quickly. Pt noted to have a tissue placed in peri area to prevent incontinence.       Vision                     Perception     Praxis      Cognition   Behavior During Therapy: WFL for tasks assessed/performed Overall Cognitive Status: Within Functional Limits for tasks assessed                         Exercises     Shoulder Instructions       General Comments      Pertinent Vitals/ Pain       Pain Assessment: Faces Pain Score: 6  Faces Pain Scale: Hurts even more Pain Location: L Leg Pain Descriptors / Indicators: Guarding Pain Intervention(s): Monitored during session;Premedicated before session;Repositioned  Home Living Family/patient expects to be discharged to:: Unsure Living Arrangements: Alone  Prior Functioning/Environment              Frequency  Min 2X/week        Progress Toward Goals  OT Goals(current goals can now be found in the care plan section)  Progress towards OT goals: Progressing toward goals  Acute Rehab OT Goals Patient Stated Goal: to go home with help OT Goal Formulation: With patient/family Time For Goal Achievement: 08/12/16 Potential to Achieve Goals: Good ADL Goals Pt Will Perform Lower Body Bathing: with supervision;sit to/from stand Pt Will Perform Lower Body Dressing: with supervision;sit to/from stand Pt Will Transfer to Toilet: with supervision;ambulating;bedside commode Pt Will Perform Toileting - Clothing Manipulation and hygiene: with supervision;sit to/from stand Pt Will Perform Tub/Shower Transfer: Tub transfer;with supervision;ambulating;rolling walker  Plan Discharge plan remains appropriate    Co-evaluation                  End of Session Equipment Utilized During Treatment: Gait belt;Rolling walker  OT Visit Diagnosis: Other abnormalities of gait and mobility (R26.89);Pain Pain - Right/Left: Left Pain - part of body: Hip   Activity Tolerance Patient tolerated treatment well   Patient Left in bed;with call bell/phone within reach;with family/visitor present;in CPM   Nurse Communication Mobility status        Time: 4193-7902 OT Time Calculation (min): 18 min  Charges: OT General Charges $OT Visit: 1 Procedure OT Treatments $Self Care/Home Management : 8-22 mins   Mateo Flow   OTR/L Pager: [phone] Office: [phone] .    Boone Master B 07/31/2016, 10:31 AM

## 2016-08-01 NOTE — Progress Notes (Signed)
Physical Therapy Treatment Patient Details Name: Leslie Ball MRN: [id number] DOB: 07/11/1940 Today's Date: 08/01/2016    History of Present Illness pt is a 76 yo female admitted for elective L THA. PMH significant for R eye blindness, GERD, HTN, RA, L TKA 2015 and R TKA 2014.     PT Comments    Patient is making progress toward mobility goals. Pt able to increase gait distance this session. Continue to progress as tolerated with anticipated d/c home with HHPT.    Follow Up Recommendations  Home health PT;Supervision/Assistance - 24 hour;Supervision for mobility/OOB     Equipment Recommendations  Rolling walker with 5" wheels;3in1 (PT)    Recommendations for Other Services       Precautions / Restrictions Precautions Precautions: None Restrictions LLE Weight Bearing: Weight bearing as tolerated    Mobility  Bed Mobility               General bed mobility comments: pt OOB in chair upon arrival  Transfers Overall transfer level: Needs assistance Equipment used: Rolling walker (2 wheeled) Transfers: Sit to/from Stand Sit to Stand: Min assist         General transfer comment: assist to power up into standing with min A; cues for hand placement and technique  Ambulation/Gait Ambulation/Gait assistance: Min guard Ambulation Distance (Feet): 100 Feet Assistive device: Rolling walker (2 wheeled) Gait Pattern/deviations: Decreased step length - right;Decreased step length - left;Decreased weight shift to left;Decreased dorsiflexion - left;Decreased dorsiflexion - right Gait velocity: decreased   General Gait Details: pt with short bilat step lengths and decreased DF; cues for increased L step length and heel strike   Stairs            Wheelchair Mobility    Modified Rankin (Stroke Patients Only)       Balance     Sitting balance-Leahy Scale: Fair     Standing balance support: During functional activity Standing balance-Leahy Scale: Fair                       Cognition Arousal/Alertness: Awake/alert Behavior During Therapy: WFL for tasks assessed/performed Overall Cognitive Status: Within Functional Limits for tasks assessed                      Exercises      General Comments        Pertinent Vitals/Pain Pain Assessment: Faces Faces Pain Scale: Hurts little more Pain Location: L hip and R knee Pain Descriptors / Indicators: Aching;Sore Pain Intervention(s): Monitored during session;Premedicated before session;Repositioned    Home Living                      Prior Function            PT Goals (current goals can now be found in the care plan section) Acute Rehab PT Goals Patient Stated Goal: to go home with help PT Goal Formulation: With patient Time For Goal Achievement: 08/05/16 Potential to Achieve Goals: Good Progress towards PT goals: Progressing toward goals    Frequency    7X/week      PT Plan Current plan remains appropriate    Co-evaluation             End of Session Equipment Utilized During Treatment: Gait belt Activity Tolerance: Patient tolerated treatment well Patient left: in chair;with call bell/phone within reach Nurse Communication: Mobility status PT Visit Diagnosis: Difficulty in walking, not elsewhere classified (  R26.2);Pain Pain - Right/Left: Left Pain - part of body: Hip     Time: 7829-5621 PT Time Calculation (min) (ACUTE ONLY): 26 min  Charges:  $Gait Training: 23-37 mins                    G Codes:       Derek Mound, PTA Pager: [phone]   08/01/2016, 1:21 PM

## 2016-08-01 NOTE — Progress Notes (Signed)
Physical Therapy Treatment Patient Details Name: Leslie Ball MRN: [id number] DOB: 1940-12-30 Today's Date: 08/01/2016    History of Present Illness pt is a 76 yo female admitted for elective L THA. PMH significant for R eye blindness, GERD, HTN, RA, L TKA 2015 and R TKA 2014.     PT Comments    Patient continues to make progress toward mobility goals and tolerated mobility with decreased pain this session. Current plan remains appropriate.     Follow Up Recommendations  Home health PT;Supervision/Assistance - 24 hour;Supervision for mobility/OOB     Equipment Recommendations  Rolling walker with 5" wheels;3in1 (PT)    Recommendations for Other Services       Precautions / Restrictions Precautions Precautions: None Restrictions LLE Weight Bearing: Weight bearing as tolerated    Mobility  Bed Mobility               General bed mobility comments: pt OOB in chair upon arrival  Transfers Overall transfer level: Needs assistance Equipment used: Rolling walker (2 wheeled) Transfers: Sit to/from Stand Sit to Stand: Min assist;Min guard         General transfer comment: assist to power up from recliner into standing with min A and min guard from [facility]; cues for hand placement and technique  Ambulation/Gait Ambulation/Gait assistance: Min guard Ambulation Distance (Feet): 20 Feet Assistive device: Rolling walker (2 wheeled) Gait Pattern/deviations: Decreased step length - right;Decreased step length - left;Decreased weight shift to left;Decreased dorsiflexion - left;Decreased dorsiflexion - right Gait velocity: decreased   General Gait Details: cues for increased bilat step lengths and heel strike   Stairs            Wheelchair Mobility    Modified Rankin (Stroke Patients Only)       Balance     Sitting balance-Leahy Scale: Fair     Standing balance support: During functional activity Standing balance-Leahy Scale: Fair                       Cognition Arousal/Alertness: Awake/alert Behavior During Therapy: WFL for tasks assessed/performed Overall Cognitive Status: Within Functional Limits for tasks assessed                      Exercises Total Joint Exercises Quad Sets: AROM;Both;10 reps Short Arc Quad: AROM;Left;10 reps Heel Slides: AROM;Left;10 reps Hip ABduction/ADduction: AROM;Left;Seated;Standing;Other (comment) (10 reps seated and 10 reps standing) Long Arc Quad: AROM;Left;10 reps Knee Flexion: AROM;Left;10 reps;Standing Marching in Standing: AROM;Left;10 reps;Standing Standing Hip Extension: AROM;Left;10 reps;Standing    General Comments General comments (skin integrity, edema, etc.): son and daughter in law present end of session      Pertinent Vitals/Pain Pain Assessment: Faces Faces Pain Scale: Hurts a little bit Pain Location: L hip and R knee Pain Descriptors / Indicators: Aching;Sore Pain Intervention(s): Monitored during session;Repositioned;Patient requesting pain meds-RN notified    Home Living                      Prior Function            PT Goals (current goals can now be found in the care plan section) Acute Rehab PT Goals Patient Stated Goal: to go home with help PT Goal Formulation: With patient Time For Goal Achievement: 08/05/16 Potential to Achieve Goals: Good Progress towards PT goals: Progressing toward goals    Frequency    7X/week      PT Plan Current plan  remains appropriate    Co-evaluation             End of Session Equipment Utilized During Treatment: Gait belt Activity Tolerance: Patient tolerated treatment well Patient left: in chair;with call bell/phone within reach;with nursing/sitter in room;with family/visitor present Nurse Communication: Mobility status PT Visit Diagnosis: Difficulty in walking, not elsewhere classified (R26.2);Pain Pain - Right/Left: Left Pain - part of body: Hip     Time: 0349-1791 PT Time  Calculation (min) (ACUTE ONLY): 28 min  Charges:  $Gait Training: 8-22 mins $Therapeutic Exercise: 8-22 mins                    G Codes:       Derek Mound, PTA Pager: [phone]   08/01/2016, 4:21 PM

## 2016-08-01 NOTE — Progress Notes (Signed)
Patient lying in bed awake and alert upon entering room at 2000. Patient assessment completed. No c/o pain or sob at this time. Oriented x 3. Patient s/p left hip replacement. Left hip surgical site benign; dressing dry and intact. Call bell and belongings within reach. Will continue to monitor patients status.

## 2016-08-03 ENCOUNTER — Telehealth (INDEPENDENT_AMBULATORY_CARE_PROVIDER_SITE_OTHER): Payer: Self-pay | Admitting: Orthopedic Surgery

## 2016-08-03 NOTE — Telephone Encounter (Signed)
Arline Asp (Development worker, international aid with [facility] called needing orders for (PT) to treat and eval. The fax# is [phone]   The phone # is [phone]

## 2016-08-03 NOTE — Telephone Encounter (Signed)
I called and spoke with Arline Asp and gave verbal ok for HHPT orders.

## 2016-08-04 ENCOUNTER — Telehealth (INDEPENDENT_AMBULATORY_CARE_PROVIDER_SITE_OTHER): Payer: Self-pay | Admitting: Orthopedic Surgery

## 2016-08-04 NOTE — Telephone Encounter (Signed)
I called, verbal order given.

## 2016-08-04 NOTE — Telephone Encounter (Signed)
Randa Evens from [facility] called asking for approval of 1 time the first week and 2 times a week for 3 weeks. CB # [phone]

## 2016-08-09 NOTE — Discharge Summary (Signed)
Physician Discharge Summary  Patient ID: Leslie Ball MRN: [id number] DOB/AGE: [birth year] 76 y.o.  Admit date: 07/28/2016 Discharge date: 08/01/2016  Admission Diagnoses:  Active Problems:   Hip arthritis   Discharge Diagnoses:  Same  Surgeries: Procedure(s): LEFT ANTERIOR TOTAL HIP ARTHROPLASTY on 07/28/2016   Consultants:   Discharged Condition: Stable  Hospital Course: Leslie Ball is an 76 y.o. 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  They were brought to the operating room on 07/28/2016 and underwent the above named procedures.  Patient tolerated hip replacement well.  Predictably she was slow to mobilize with physical therapy but was making progress by the time of discharge.  She did receive 1 unit packed red blood cell transfusion on postop day #1.  Otherwise she had an unremarkable recovery and was discharged to home with 24-hour care in good position and condition.  She will follow-up with me in 2 weeks  Antibiotics given:  Anti-infectives    Start     Dose/Rate Route Frequency Ordered Stop   07/28/16 1430  ceFAZolin (ANCEF) IVPB 2g/100 mL premix     2 g 200 mL/hr over 30 Minutes Intravenous Every 6 hours 07/28/16 1416 07/28/16 2144   07/28/16 0534  ceFAZolin (ANCEF) 2-4 GM/100ML-% IVPB    Comments:  [place]   : cabinet override      07/28/16 0534 07/28/16 0744   07/28/16 0526  ceFAZolin (ANCEF) IVPB 2g/100 mL premix     2 g 200 mL/hr over 30 Minutes Intravenous On call to O.R. 07/28/16 4627 07/28/16 0350    .  Recent vital signs:  Vitals:   07/31/16 2100 08/01/16 0641  BP: (!) 107/54 (!) 131/54  Pulse: 69 69  Resp: 16 15  Temp: 98.3 F (36.8 C) 98 F (36.7 C)    Recent laboratory studies:  Results for orders placed or performed during the hospital encounter of 07/28/16  Urine culture  Result Value Ref Range   Specimen Description URINE, CLEAN CATCH     Special Requests NONE    Culture MULTIPLE SPECIES PRESENT, SUGGEST RECOLLECTION (A)    Report Status 07/29/2016 FINAL   Urinalysis, Routine w reflex microscopic  Result Value Ref Range   Color, Urine YELLOW YELLOW   APPearance HAZY (A) CLEAR   Specific Gravity, Urine 1.019 1.005 - 1.030   pH 6.0 5.0 - 8.0   Glucose, UA NEGATIVE NEGATIVE mg/dL   Hgb urine dipstick SMALL (A) NEGATIVE   Bilirubin Urine NEGATIVE NEGATIVE   Ketones, ur NEGATIVE NEGATIVE mg/dL   Protein, ur NEGATIVE NEGATIVE mg/dL   Nitrite NEGATIVE NEGATIVE   Leukocytes, UA NEGATIVE NEGATIVE   RBC / HPF 0-5 0 - 5 RBC/hpf   WBC, UA 0-5 0 - 5 WBC/hpf   Bacteria, UA RARE (A) NONE SEEN   Squamous Epithelial / LPF 0-5 (A) NONE SEEN  CBC  Result Value Ref Range   WBC 8.2 4.0 - 10.5 K/uL   RBC 2.97 (L) 3.87 - 5.11 MIL/uL   Hemoglobin 7.3 (L) 12.0 - 15.0 g/dL   HCT 09.3 (L) 81.8 - 29.9 %   MCV 80.8 78.0 - 100.0 fL   MCH 24.6 (L) 26.0 - 34.0 pg   MCHC 30.4 30.0 - 36.0 g/dL   RDW 37.1 (H) 69.6 - 78.9 %   Platelets 316 150 - 400 K/uL  Basic metabolic panel  Result Value Ref Range  Sodium 140 135 - 145 mmol/L   Potassium 3.5 3.5 - 5.1 mmol/L   Chloride 107 101 - 111 mmol/L   CO2 26 22 - 32 mmol/L   Glucose, Bld 114 (H) 65 - 99 mg/dL   BUN 12 6 - 20 mg/dL   Creatinine, Ser 6.59 0.44 - 1.00 mg/dL   Calcium 8.1 (L) 8.9 - 10.3 mg/dL   GFR calc non Af Amer >60 >60 mL/min   GFR calc Af Amer >60 >60 mL/min   Anion gap 7 5 - 15  CBC  Result Value Ref Range   WBC 7.3 4.0 - 10.5 K/uL   RBC 3.04 (L) 3.87 - 5.11 MIL/uL   Hemoglobin 7.6 (L) 12.0 - 15.0 g/dL   HCT 93.5 (L) 70.1 - 77.9 %   MCV 82.9 78.0 - 100.0 fL   MCH 25.0 (L) 26.0 - 34.0 pg   MCHC 30.2 30.0 - 36.0 g/dL   RDW 39.0 (H) 30.0 - 92.3 %   Platelets 289 150 - 400 K/uL  Hemoglobin and hematocrit, blood  Result Value Ref Range   Hemoglobin 7.4 (L) 12.0 - 15.0 g/dL   HCT 30.0 (L) 76.2 - 26.3 %  Urinalysis, Routine w reflex microscopic  Result Value Ref Range    Color, Urine YELLOW YELLOW   APPearance HAZY (A) CLEAR   Specific Gravity, Urine 1.012 1.005 - 1.030   pH 6.0 5.0 - 8.0   Glucose, UA NEGATIVE NEGATIVE mg/dL   Hgb urine dipstick SMALL (A) NEGATIVE   Bilirubin Urine NEGATIVE NEGATIVE   Ketones, ur NEGATIVE NEGATIVE mg/dL   Protein, ur NEGATIVE NEGATIVE mg/dL   Nitrite NEGATIVE NEGATIVE   Leukocytes, UA NEGATIVE NEGATIVE   RBC / HPF 0-5 0 - 5 RBC/hpf   WBC, UA 0-5 0 - 5 WBC/hpf   Bacteria, UA RARE (A) NONE SEEN   Squamous Epithelial / LPF 6-30 (A) NONE SEEN   Budding Yeast PRESENT   Type and screen [facility]  Result Value Ref Range   ABO/RH(D) O POS    Antibody Screen NEG    Sample Expiration 07/31/2016    Unit Number [id number]    Blood Component Type RBC CPDA1, LR    Unit division 00    Status of Unit ISSUED,FINAL    Transfusion Status OK TO TRANSFUSE    Crossmatch Result Compatible   Prepare RBC  Result Value Ref Range   Order Confirmation ORDER PROCESSED BY BLOOD BANK   BPAM RBC  Result Value Ref Range   ISSUE DATE / TIME [id number]    Blood Product Unit Number [id number]    PRODUCT CODE [id number]    Unit Type and Rh 5100    Blood Product Expiration Date [id number]     Discharge Medications:   Allergies as of 08/01/2016      Reactions   Chlorhexidine Itching   REACTION: redness      Medication List    STOP taking these medications   acetaminophen-codeine 300-30 MG tablet Commonly known as:  TYLENOL #3   meloxicam 7.5 MG tablet Commonly known as:  MOBIC     TAKE these medications   brinzolamide 1 % ophthalmic suspension Commonly known as:  AZOPT Place 1 drop into the left eye daily.   DSS 100 MG Caps Take 100 mg by mouth 2 (two) times daily. What changed:  when to take this  reasons to take this   Fluticasone-Salmeterol 250-50 MCG/DOSE Aepb Commonly known as:  ADVAIR Inhale 1  puff into the lungs every 12 (twelve) hours.   hydrochlorothiazide 12.5 MG  capsule Commonly known as:  MICROZIDE Take 12.5 mg by mouth daily after supper.   metoprolol succinate 25 MG 24 hr tablet Commonly known as:  TOPROL-XL Take 25 mg by mouth daily.   oxybutynin 5 MG tablet Commonly known as:  DITROPAN Take 1 tablet (5 mg total) by mouth 2 (two) times daily.   oxyCODONE 5 MG immediate release tablet Commonly known as:  Oxy IR/ROXICODONE Take 1-2 tablets (5-10 mg total) by mouth every 3 (three) hours as needed for breakthrough pain.   rivaroxaban 10 MG Tabs tablet Commonly known as:  XARELTO Take 1 tablet (10 mg total) by mouth daily with breakfast.   theophylline 300 MG 12 hr tablet Commonly known as:  THEODUR Take 150 mg by mouth at bedtime.   traMADol 50 MG tablet Commonly known as:  ULTRAM Take 1 tablet (50 mg total) by mouth every 12 (twelve) hours as needed (FOR PAIN.). What changed:  when to take this   Travoprost (BAK Free) 0.004 % Soln ophthalmic solution Commonly known as:  TRAVATAN Place 1 drop into both eyes at bedtime.       Diagnostic Studies: Dg Chest 2 View  Result Date: 07/28/2016 CLINICAL DATA:  Preoperative evaluation for upcoming hip replacement EXAM: CHEST  2 VIEW COMPARISON:  03/17/2013 FINDINGS: Cardiac shadow is within normal limits. The lungs are clear bilaterally. Degenerative changes of thoracic spine are noted. IMPRESSION: No active cardiopulmonary disease. Electronically Signed   By: Alcide Clever M.D.   On: 07/28/2016 09:11   Dg C-arm Gt 120 Min  Result Date: 07/28/2016 CLINICAL DATA:  Left hip replacement EXAM: DG C-ARM GT 120 MIN; OPERATIVE LEFT HIP WITH PELVIS COMPARISON:  None. FLUOROSCOPY TIME:  Radiation Exposure Index (as provided by the fluoroscopic device): Not available If the device does not provide the exposure index: Fluoroscopy Time:  56 seconds Number of Acquired Images:  3 FINDINGS: Left hip replacement is noted in satisfactory position. 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 No  acute bony abnormality is seen. IMPRESSION: Status post left hip replacement. Electronically Signed   By: Alcide Clever M.D.   On: 07/28/2016 10:43   Dg Hip Port Unilat With Pelvis 1v Left  Result Date: 07/28/2016 CLINICAL DATA:  Post left total hip replacement EXAM: DG HIP (WITH OR WITHOUT PELVIS) 1V PORT LEFT COMPARISON:  CT abdomen pelvis -05/18/2016 FINDINGS: Post left total hip replacement. Alignment appears anatomic. No evidence of hardware failure loosening. No fracture or dislocation. Minimal amount of expected subcutaneous emphysema about the operative site. No radiopaque foreign body. Moderate to severe degenerative change of the contralateral right hip with joint space loss, subchondral sclerosis and osteophytosis. Calcified uterine fibroids overlie the left lower pelvis compatible with the findings on abdominal CT performed 04/2016. Multiple phleboliths overlie the lower pelvis. IMPRESSION: Post left total hip replacement without evidence of complication. Electronically Signed   By: Simonne Come M.D.   On: 07/28/2016 13:05   Dg Hip Operative Unilat W Or W/o Pelvis Left  Result Date: 07/28/2016 CLINICAL DATA:  Left hip replacement EXAM: DG C-ARM GT 120 MIN; OPERATIVE LEFT HIP WITH PELVIS COMPARISON:  None. FLUOROSCOPY TIME:  Radiation Exposure Index (as provided by the fluoroscopic device): Not available If the device does not provide the exposure index: Fluoroscopy Time:  56 seconds Number of Acquired Images:  3 FINDINGS: Left hip replacement is noted in satisfactory position. Large calcified uterine  fibroids are noted within the pelvis. No acute bony abnormality is seen. IMPRESSION: Status post left hip replacement. Electronically Signed   By: Alcide Clever M.D.   On: 07/28/2016 10:43    Disposition: 01-Home or Self Care  Discharge Instructions    Call MD / Call 911    Complete by:  As directed    If you experience chest pain or shortness of breath, CALL 911 and be transported to the hospital  emergency room.  If you develope a fever above 101 F, pus (white drainage) or increased drainage or redness at the wound, or calf pain, call your surgeon's office.   Constipation Prevention    Complete by:  As directed    Drink plenty of fluids.  Prune juice may be helpful.  You may use a stool softener, such as Colace (over the counter) 100 mg twice a day.  Use MiraLax (over the counter) for constipation as needed.   Diet - low sodium heart healthy    Complete by:  As directed    Discharge instructions    Complete by:  As directed    OK to weight bear as tolerated with walker Ok to shower Remove dressing in 10 days   Increase activity slowly as tolerated    Complete by:  As directed       Follow-up Information    [facility] Follow up.   Specialty:  Home Health Services Why:  A representative from [facility] will contact you to arrange start date and time for therapy. Contact information: [address] [place] [address] [place] [address] [phone]            Signed: Burnard Bunting 08/09/2016, 12:00 PM

## 2016-08-11 ENCOUNTER — Ambulatory Visit (INDEPENDENT_AMBULATORY_CARE_PROVIDER_SITE_OTHER): Payer: Medicaid Other | Admitting: Orthopedic Surgery

## 2016-08-11 ENCOUNTER — Ambulatory Visit (INDEPENDENT_AMBULATORY_CARE_PROVIDER_SITE_OTHER): Payer: Medicaid Other

## 2016-08-11 ENCOUNTER — Encounter (INDEPENDENT_AMBULATORY_CARE_PROVIDER_SITE_OTHER): Payer: Self-pay

## 2016-08-11 ENCOUNTER — Encounter (INDEPENDENT_AMBULATORY_CARE_PROVIDER_SITE_OTHER): Payer: Self-pay | Admitting: Orthopedic Surgery

## 2016-08-11 ENCOUNTER — Telehealth (INDEPENDENT_AMBULATORY_CARE_PROVIDER_SITE_OTHER): Payer: Self-pay | Admitting: Orthopedic Surgery

## 2016-08-11 DIAGNOSIS — M1612 Unilateral primary osteoarthritis, left hip: Secondary | ICD-10-CM

## 2016-08-11 MED ORDER — OXYBUTYNIN CHLORIDE 5 MG PO TABS: 5.0000 mg | ORAL_TABLET | Freq: Two times a day (BID) | ORAL | 0 refills | Status: DC

## 2016-08-11 NOTE — Telephone Encounter (Signed)
Patients son called asking about his mother medication. She is taking xarelto 10 mg and only has a few tablets left. He was wondering if she needed a refill or not? CB # [phone]

## 2016-08-11 NOTE — Progress Notes (Signed)
Post-Op Visit Note   Patient: Leslie Ball           Date of Birth: [birth year]           MRN: [id number] Visit Date: 08/11/2016 PCP: Dorrene German, MD   Assessment & Plan:  Chief Complaint:  Chief Complaint  Patient presents with  . Left Hip - Routine Post Op   Visit Diagnoses:  1. Primary osteoarthritis of left hip     Plan:Patient's doing well now 11 days out left total hip replacement.  On exam incisions intact.  Leg lengths equal.  Hip flexion strength a little weaker on the left as expected.  Radiographs look good.  I will continue her on her oxybutynin.  She's going to talk to Dr. pure about that as well.  She is walking every hour around her home.  Her pain is improved.  I will see her back in 4 weeks   Follow-Up Instructions: No Follow-up on file.   Orders:  Orders Placed This Encounter  Procedures  . XR HIP UNILAT W OR W/O PELVIS 1V LEFT   No orders of the defined types were placed in this encounter.   Imaging: Xr Hip Unilat W Or W/o Pelvis 1v Left  Result Date: 08/11/2016 AP pelvis lateral left hip reviewed.  Calcified mass present within the pelvis.  This is been characterized on previous studies.  Right hip arthritis is present.  Left total hip prosthesis in good position with good alignment of the cup and no evidence of periprosthetic fracture.   PMFS History: Patient Active Problem List   Diagnosis Date Noted  . Hip arthritis 07/28/2016  . Primary osteoarthritis of left hip 06/15/2016  . Nausea with vomiting 07/21/2013  . Unspecified constipation 07/21/2013  . Abdominal pain, epigastric 07/21/2013  . Arthritis of knee 07/15/2013  . Arthritis of left knee 04/15/2013   Past Medical History:  Diagnosis Date  . Anemia   . Arthritis    "left knee; probably left shoulder & elbow" (07/15/2013)  . Asthma   . 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 Constipation    occasionally  . GERD (gastroesophageal reflux disease)    rare use of Pepto bismol  . Hypertension   . Neuromuscular disorder (HCC)    right hand has "pins and needles" feeling at times  . RA (rheumatoid arthritis) (HCC)     No family history on file.  Past Surgical History:  Procedure Laterality Date  . CATARACT EXTRACTION W/ INTRAOCULAR LENS IMPLANT Bilateral   . EYE SURGERY Right   . JOINT REPLACEMENT    . REFRACTIVE SURGERY Bilateral   . RETINAL DETACHMENT SURGERY Right   . TOTAL HIP ARTHROPLASTY Left 07/28/2016   Procedure: LEFT ANTERIOR TOTAL HIP ARTHROPLASTY;  Surgeon: Cammy Copa, MD;  Location: [facility] OR;  Service: Orthopedics;  Laterality: Left;  . TOTAL KNEE ARTHROPLASTY Right 04/15/2013   Procedure: TOTAL KNEE ARTHROPLASTY;  Surgeon: Cammy Copa, MD;  Location: [facility] OR;  Service: Orthopedics;  Laterality: Right;  . TOTAL KNEE ARTHROPLASTY Left 07/15/2013  . TOTAL KNEE ARTHROPLASTY Left 07/15/2013   Procedure: TOTAL KNEE ARTHROPLASTY- left;  Surgeon: Cammy Copa, MD;  Location: [facility] OR;  Service: Orthopedics;  Laterality: Left;  . TOTAL KNEE ARTHROPLASTY Left 07/28/2016   Social History   Occupational History  . Not on file.   Social History Main  Topics  . Smoking status: Never Smoker  . Smokeless tobacco: Never Used  . Alcohol use No  . Drug use: No  . Sexual activity: Not on file

## 2016-08-11 NOTE — Telephone Encounter (Signed)
Per Dr August Saucer advised to complete current rx and should not need a refill

## 2016-09-11 ENCOUNTER — Ambulatory Visit (INDEPENDENT_AMBULATORY_CARE_PROVIDER_SITE_OTHER): Payer: Medicaid Other | Admitting: Orthopedic Surgery

## 2016-09-14 ENCOUNTER — Ambulatory Visit (INDEPENDENT_AMBULATORY_CARE_PROVIDER_SITE_OTHER): Payer: Medicaid Other | Admitting: Orthopedic Surgery

## 2016-09-14 ENCOUNTER — Encounter (INDEPENDENT_AMBULATORY_CARE_PROVIDER_SITE_OTHER): Payer: Self-pay | Admitting: Orthopedic Surgery

## 2016-09-14 ENCOUNTER — Encounter (INDEPENDENT_AMBULATORY_CARE_PROVIDER_SITE_OTHER): Payer: Self-pay

## 2016-09-14 ENCOUNTER — Ambulatory Visit (INDEPENDENT_AMBULATORY_CARE_PROVIDER_SITE_OTHER): Payer: Medicaid Other

## 2016-09-14 DIAGNOSIS — M1612 Unilateral primary osteoarthritis, left hip: Secondary | ICD-10-CM

## 2016-09-16 NOTE — Progress Notes (Signed)
Post-Op Visit Note   Patient: Leslie Ball           Date of Birth: August 31, 1940           MRN: [id number] Visit Date: 09/14/2016 PCP: Dorrene German, MD   Assessment & Plan:  Chief Complaint:  Chief Complaint  Patient presents with  . Left Hip - Routine Post Op   Visit Diagnoses:  1. Primary osteoarthritis of left hip     Plan: Patient is now 6 weeks out left total hip replacement.  She's having slow progress in terms of strength and walking endurance..  She's only had one physical therapy appointment.  She is on tramadol.  On exam leg lengths are equal there is no real groin pain with internal/external rotation on the left-hand side.  Both knees have good extensor mechanism.  Pedal pulses palpable.  Hip flexion strength is about 5 minus out of 5 bilaterally.  Radiographs show good position and alignment of the left total hip prosthesis.  Plan at this time is patient needs to find some way to get some physical therapy to work on gait training and strengthening.  Her current insurance only approves 1 visit which she is had.  I see no issues with the hip replacement.  She's having no fevers or chills.  Her right hip is severely arthritic.  I think in general she was fairly deconditioned before the surgery and after the surgery she is somewhat in the same boat.  I'll see her back in 6 weeks for clinical recheck on walking endurance and strength  Follow-Up Instructions: Return in about 6 weeks (around 10/26/2016).   Orders:  Orders Placed This Encounter  Procedures  . XR HIP UNILAT W OR W/O PELVIS 2-3 VIEWS LEFT   No orders of the defined types were placed in this encounter.   Imaging: No results found.  PMFS History: Patient Active Problem List   Diagnosis Date Noted  . Hip arthritis 07/28/2016  . Primary osteoarthritis of left hip 06/15/2016  . Nausea with vomiting 07/21/2013  . Unspecified constipation 07/21/2013  . Abdominal pain, epigastric 07/21/2013  . Arthritis  of knee 07/15/2013  . Arthritis of left knee 04/15/2013   Past Medical History:  Diagnosis Date  . Anemia   . Arthritis    "left knee; probably left shoulder & elbow" (07/15/2013)  . Asthma   . 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 Constipation    occasionally  . GERD (gastroesophageal reflux disease)    rare use of Pepto bismol  . Hypertension   . Neuromuscular disorder (HCC)    right hand has "pins and needles" feeling at times  . RA (rheumatoid arthritis) (HCC)     No family history on file.  Past Surgical History:  Procedure Laterality Date  . CATARACT EXTRACTION W/ INTRAOCULAR LENS IMPLANT Bilateral   . EYE SURGERY Right   . JOINT REPLACEMENT    . REFRACTIVE SURGERY Bilateral   . RETINAL DETACHMENT SURGERY Right   . TOTAL HIP ARTHROPLASTY Left 07/28/2016   Procedure: LEFT ANTERIOR TOTAL HIP ARTHROPLASTY;  Surgeon: Cammy Copa, MD;  Location: [facility] OR;  Service: Orthopedics;  Laterality: Left;  . TOTAL KNEE ARTHROPLASTY Right 04/15/2013   Procedure: TOTAL KNEE ARTHROPLASTY;  Surgeon: Cammy Copa, MD;  Location: [facility] OR;  Service: Orthopedics;  Laterality: Right;  . TOTAL KNEE ARTHROPLASTY  Left 07/15/2013  . TOTAL KNEE ARTHROPLASTY Left 07/15/2013   Procedure: TOTAL KNEE ARTHROPLASTY- left;  Surgeon: Cammy Copa, MD;  Location: [facility] OR;  Service: Orthopedics;  Laterality: Left;  . TOTAL KNEE ARTHROPLASTY Left 07/28/2016   Social History   Occupational History  . Not on file.   Social History Main Topics  . Smoking status: Never Smoker  . Smokeless tobacco: Never Used  . Alcohol use No  . Drug use: No  . Sexual activity: Not on file

## 2016-10-03 ENCOUNTER — Telehealth (INDEPENDENT_AMBULATORY_CARE_PROVIDER_SITE_OTHER): Payer: Self-pay | Admitting: Orthopedic Surgery

## 2016-10-03 DIAGNOSIS — Z96649 Presence of unspecified artificial hip joint: Secondary | ICD-10-CM

## 2016-10-03 NOTE — Telephone Encounter (Signed)
PT DAUGHTER IN LAW CALLED AND ASKED IF PT CAN HAVE PHYS THERAPY. SAYS SHE IS GOING BACKWARDS INSTEAD OF FORWARD SINCE HER  SURGERY.  [phone] MELANIE

## 2016-10-04 NOTE — Telephone Encounter (Signed)
Order for P.T. Sent. S/w daughter in law and explained may only authorize 1-3 visits due to insurance but included for HEP so exercises could be done at home as well. Dr August Saucer advised this was done.

## 2016-10-10 ENCOUNTER — Ambulatory Visit: Payer: Medicaid Other | Admitting: Physical Therapy

## 2016-10-18 ENCOUNTER — Ambulatory Visit: Payer: Medicaid Other | Attending: Orthopedic Surgery | Admitting: Physical Therapy

## 2016-10-18 DIAGNOSIS — M25552 Pain in left hip: Secondary | ICD-10-CM | POA: Diagnosis present

## 2016-10-18 DIAGNOSIS — M6281 Muscle weakness (generalized): Secondary | ICD-10-CM

## 2016-10-18 DIAGNOSIS — R262 Difficulty in walking, not elsewhere classified: Secondary | ICD-10-CM | POA: Diagnosis present

## 2016-10-18 DIAGNOSIS — M25551 Pain in right hip: Secondary | ICD-10-CM | POA: Diagnosis present

## 2016-10-19 NOTE — Therapy (Signed)
[facility] [address] [place], [place], [address] Phone: [phone]   Fax:  [phone]  Physical Therapy Evaluation  Patient Details  Name: Leslie Ball MRN: [id number] Date of Birth: 1941/02/13 Referring Provider: Cammy Copa  Encounter Date: 10/18/2016      PT End of Session - 10/19/16 0724    Visit Number 1   Number of Visits 8   Date for PT Re-Evaluation 12/28/16   Authorization Type medicaid   PT Start Time 1115  arrived late   PT Stop Time 1145   PT Time Calculation (min) 30 min   Activity Tolerance Patient tolerated treatment well;Patient limited by pain      Past Medical History:  Diagnosis Date  . Anemia   . Arthritis    "left knee; probably left shoulder & elbow" (07/15/2013)  . Asthma   . 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 Constipation    occasionally  . GERD (gastroesophageal reflux disease)    rare use of Pepto bismol  . Hypertension   . Neuromuscular disorder (HCC)    right hand has "pins and needles" feeling at times  . RA (rheumatoid arthritis) (HCC)     Past Surgical History:  Procedure Laterality Date  . CATARACT EXTRACTION W/ INTRAOCULAR LENS IMPLANT Bilateral   . EYE SURGERY Right   . JOINT REPLACEMENT    . REFRACTIVE SURGERY Bilateral   . RETINAL DETACHMENT SURGERY Right   . TOTAL HIP ARTHROPLASTY Left 07/28/2016   Procedure: LEFT ANTERIOR TOTAL HIP ARTHROPLASTY;  Surgeon: Cammy Copa, MD;  Location: [facility] OR;  Service: Orthopedics;  Laterality: Left;  . TOTAL KNEE ARTHROPLASTY Right 04/15/2013   Procedure: TOTAL KNEE ARTHROPLASTY;  Surgeon: Cammy Copa, MD;  Location: [facility] OR;  Service: Orthopedics;  Laterality: Right;  . TOTAL KNEE ARTHROPLASTY Left 07/15/2013  . TOTAL KNEE ARTHROPLASTY Left 07/15/2013   Procedure: TOTAL KNEE ARTHROPLASTY- left;  Surgeon: Cammy Copa,  MD;  Location: [facility] OR;  Service: Orthopedics;  Laterality: Left;  . TOTAL KNEE ARTHROPLASTY Left 07/28/2016    There were no vitals filed for this visit.       Subjective Assessment - 10/18/16 1136    Subjective Interpreter present throughout and interpreter present.  Son provided some of history.  Pt reprots right hip hurting more than left even though surgery was on left and feels weak all over.  Unable to stand for very long and unable to walk without walker.     Patient is accompained by: Family member;Interpreter  son   Pertinent History left TKR 2015, blind in right eye, RA, hypertension   Limitations Standing;Walking   Patient Stated Goals get rid of the walker, strength   Currently in Pain? Yes   Pain Score 6   extreme pain when standing   Pain Location Hip   Pain Orientation Right;Left   Pain Descriptors / Indicators Sharp   Pain Type Surgical pain   Pain Onset More than a month ago   Aggravating Factors  standing and walking   Pain Relieving Factors pain medicine, sit down rest   Effect of Pain on Daily Activities unable to stand, walk, sleep   Multiple Pain Sites No            [facility] PT Assessment - 10/19/16 0001      Assessment   Medical  Diagnosis Z96.649 History of hip replacement unspcified laterality   Referring Provider Cammy Copa   Onset Date/Surgical Date 07/28/16   Prior Therapy 1 home health visit after surgery     Precautions   Precautions None  none specified - s/p hip replacement     Restrictions   Weight Bearing Restrictions No     Home Environment   Living Environment Private residence   Living Arrangements Alone  son helps her as needed   Type of Home House     Prior Function   Level of Independence Independent   Vocation Unemployed   Leisure house hold chores, cooking     Cognition   Overall Cognitive Status Within Functional Limits for tasks assessed     Observation/Other Assessments   Focus on Therapeutic Outcomes  (FOTO)  73% limited  goal 59% limited     Posture/Postural Control   Posture Comments flexed posture, walker height too high and elevated shoulders     Strength   Overall Strength Comments LE strength 4-/5 bilaterally     Transfers   Five time sit to stand comments  33 sec     Ambulation/Gait   Ambulation/Gait Yes   Ambulation/Gait Assistance 6: Modified independent (Device/Increase time)   Ambulation Distance (Feet) 150 Feet   Assistive device Rolling walker   Gait Pattern Decreased stride length;Trunk flexed     Timed Up and Go Test   Normal TUG (seconds) 98  used RW            Objective measurements completed on examination: See above findings.                    PT Short Term Goals - 10/19/16 5625      PT SHORT TERM GOAL #1   Title independent with initial HEP   Time 4   Period Weeks   Status New     PT SHORT TERM GOAL #2   Title reports 25% less pain in bilateral hip due to improved strength and improved gait   Time 4   Period Weeks   Status New           PT Long Term Goals - 10/19/16 0740      PT LONG TERM GOAL #1   Title FOTO score reduced to < or = to 59% limited   Baseline 73%   Time 8   Period Weeks   Status New     PT LONG TERM GOAL #2   Title TUG < 1 min with least restrictive AD for decreased risk of falls   Baseline 1 min 38 sec   Time 8   Period Weeks   Status New     PT LONG TERM GOAL #3   Title Pt able to walk with LRAD, upright posture, and improved stride length due to increased LE strength.   Baseline flexed posture and small stride length with rolling walker   Time 8   Period Weeks   Status New     PT LONG TERM GOAL #4   Title five times sit to stand decreased to < or = to 23 sec due to imporved LE strength   Baseline 33 sec   Time 8   Period Weeks   Status New                Plan - 10/19/16 0754    Clinical Impression Statement Patient presents to clinic with her son and interpreter. She  is walking with RW every since hip surgery in March.  Patient has been having bilateral hip pain.  Lately, she is also noticeing left shoulder and wrist pain.  Pt is feeling more unsteady and demonstrates increased fall risk based on balance assessments including TUG and 5 x sit to stand. Pt demonstrates overall LE weakness 4-/5 MMT.  She has gait and postural deficits and needs min assist for stand step transfers due to unsteadiness.  She is modified indepenedent with RW but does not have the right size walker for her at this time.  Patient will benefit from skilled PT to work on posture, gait, balance, strength and endurance so she can return to all functional activities needed to care for herself at home.   History and Personal Factors relevant to plan of care: left TKR 2015, blind in right eye, RA, hypertension   Clinical Presentation Evolving   Clinical Presentation due to: gait impairments leading to pain in other hip, knees, and shoulders   Clinical Decision Making Moderate   Rehab Potential Good   Clinical Impairments Affecting Rehab Potential left TKR 2015, blind in right eye, RA, hypertension   PT Frequency 1x / week   PT Duration 8 weeks   PT Treatment/Interventions ADLs/Self Care Home Management;Biofeedback;Cryotherapy;Electrical Stimulation;Iontophoresis 4mg /ml Dexamethasone;Moist Heat;Ultrasound;Gait training;Stair training;Functional mobility training;Therapeutic activities;Therapeutic exercise;Balance training;Neuromuscular re-education;Patient/family education;Manual techniques;Passive range of motion;Dry needling;Taping   PT Next Visit Plan progress strength and endurance, nu-step, issue initial HEP for strengthening, review gait with RW height adjusted if needed/if new RW was obtained   Recommended Other Services medical supply store AD as needed   Consulted and Agree with Plan of Care Patient;Family member/caregiver   Family Member Consulted Son      Patient will benefit from  skilled therapeutic intervention in order to improve the following deficits and impairments:  Abnormal gait, Decreased activity tolerance, Decreased balance, Difficulty walking, Decreased strength, Postural dysfunction, Pain  Visit Diagnosis: Muscle weakness (generalized)  Difficulty in walking, not elsewhere classified  Pain in left hip  Pain in right hip     Problem List Patient Active Problem List   Diagnosis Date Noted  . Hip arthritis 07/28/2016  . Primary osteoarthritis of left hip 06/15/2016  . Nausea with vomiting 07/21/2013  . Unspecified constipation 07/21/2013  . Abdominal pain, epigastric 07/21/2013  . Arthritis of knee 07/15/2013  . Arthritis of left knee 04/15/2013    04/17/2013, PT 10/19/2016, 8:07 AM  [facility] [address] [place], [place], [place] Phone: [phone]   Fax:  [phone]  Name: Leslie Ball MRN: Kevan Rosebush Date of Birth: October 25, 1940

## 2016-10-26 ENCOUNTER — Ambulatory Visit (INDEPENDENT_AMBULATORY_CARE_PROVIDER_SITE_OTHER): Payer: Medicaid Other | Admitting: Orthopedic Surgery

## 2016-10-31 ENCOUNTER — Ambulatory Visit: Payer: Medicaid Other | Attending: Orthopedic Surgery | Admitting: Physical Therapy

## 2016-10-31 ENCOUNTER — Encounter: Payer: Self-pay | Admitting: Physical Therapy

## 2016-10-31 DIAGNOSIS — H53413 Scotoma involving central area, bilateral: Secondary | ICD-10-CM | POA: Insufficient documentation

## 2016-10-31 DIAGNOSIS — M25552 Pain in left hip: Secondary | ICD-10-CM | POA: Diagnosis present

## 2016-10-31 DIAGNOSIS — M25551 Pain in right hip: Secondary | ICD-10-CM

## 2016-10-31 DIAGNOSIS — M6281 Muscle weakness (generalized): Secondary | ICD-10-CM | POA: Diagnosis present

## 2016-10-31 DIAGNOSIS — R262 Difficulty in walking, not elsewhere classified: Secondary | ICD-10-CM

## 2016-10-31 NOTE — Therapy (Signed)
[facility] [address] [place], [place], [address] Phone: [phone]   Fax:  [phone]  Physical Therapy Treatment  Patient Details  Name: Leslie Ball MRN: [id number] Date of Birth: 1940/10/05 Referring Provider: Cammy Copa  Encounter Date: 10/31/2016      PT End of Session - 10/31/16 1146    Visit Number 2   Number of Visits 8   Date for PT Re-Evaluation 12/28/16   Authorization Type medicaid 10/23/2016-12/17/2016 8 visits approved plus the eval equals 9 total   Authorization - Visit Number 2   Authorization - Number of Visits 9   PT Start Time 1100   PT Stop Time 1143   PT Time Calculation (min) 43 min   Activity Tolerance Patient limited by pain;Patient limited by fatigue   Behavior During Therapy [facility] for tasks assessed/performed      Past Medical History:  Diagnosis Date  . Anemia   . Arthritis    "left knee; probably left shoulder & elbow" (07/15/2013)  . Asthma   . 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 Constipation    occasionally  . GERD (gastroesophageal reflux disease)    rare use of Pepto bismol  . Hypertension   . Neuromuscular disorder (HCC)    right hand has "pins and needles" feeling at times  . RA (rheumatoid arthritis) (HCC)     Past Surgical History:  Procedure Laterality Date  . CATARACT EXTRACTION W/ INTRAOCULAR LENS IMPLANT Bilateral   . EYE SURGERY Right   . JOINT REPLACEMENT    . REFRACTIVE SURGERY Bilateral   . RETINAL DETACHMENT SURGERY Right   . TOTAL HIP ARTHROPLASTY Left 07/28/2016   Procedure: LEFT ANTERIOR TOTAL HIP ARTHROPLASTY;  Surgeon: Cammy Copa, MD;  Location: [facility] OR;  Service: Orthopedics;  Laterality: Left;  . TOTAL KNEE ARTHROPLASTY Right 04/15/2013   Procedure: TOTAL KNEE ARTHROPLASTY;  Surgeon: Cammy Copa, MD;  Location: [facility] OR;  Service: Orthopedics;   Laterality: Right;  . TOTAL KNEE ARTHROPLASTY Left 07/15/2013  . TOTAL KNEE ARTHROPLASTY Left 07/15/2013   Procedure: TOTAL KNEE ARTHROPLASTY- left;  Surgeon: Cammy Copa, MD;  Location[facility] OR;  Service: Orthopedics;  Laterality: Left;  . TOTAL KNEE ARTHROPLASTY Left 07/28/2016    There were no vitals filed for this visit.      Subjective Assessment - 10/31/16 1110    Subjective My right hip hurts. I am using a walker now.    Patient is accompained by: Interpreter   Pertinent History left anterior THR 07/28/2016, blind in right eye, Decreased vision in left eye; RA, hypertension; bil. TKR   Limitations Standing;Walking   Patient Stated Goals get rid of the walker, strength   Currently in Pain? Yes   Pain Score 6   right hip is 7/10   Pain Location Hip   Pain Orientation Right;Left   Pain Descriptors / Indicators Sharp   Pain Type Surgical pain   Pain Onset More than a month ago   Aggravating Factors  standing and walking   Pain Relieving Factors pain medication, sit down rest   Multiple Pain Sites No            [facility] PT Assessment - 10/31/16 0001      Precautions   Precautions None   Precaution Comments --  [facility] Adult PT Treatment/Exercise - 10/31/16 0001      Self-Care   Self-Care Other Self-Care Comments   Other Self-Care Comments  showed patient daughter-in-law the popping in the right knee so she can let the doctor know tomorrow.      Exercises   Exercises Knee/Hip;Other Exercises   Other Exercises  walking 90 feet with the rolling walker with verbal cues to lift her feet and monitor her for pain x2     Knee/Hip Exercises: Supine   Short Arc Quad Sets Strengthening;Left;1 set;20 reps  2#   Heel Slides Strengthening;Left;1 set;20 reps   Bridges Strengthening;Both;1 set;10 reps   Bridges Limitations bolster under knees; VC to hold 5 sec   Straight Leg Raises Left;2 sets;10 reps   Other Supine Knee/Hip Exercises hip  abduction left 20 times      Knee/Hip Exercises: Sidelying   Clams 20 times on left                  PT Short Term Goals - 10/31/16 1304      PT SHORT TERM GOAL #1   Title independent with initial HEP   Time 4   Period Weeks   Status On-going     PT SHORT TERM GOAL #2   Title reports 25% less pain in bilateral hip due to improved strength and improved gait   Time 4   Period Weeks   Status On-going           PT Long Term Goals - 10/19/16 0740      PT LONG TERM GOAL #1   Title FOTO score reduced to < or = to 59% limited   Baseline 73%   Time 8   Period Weeks   Status New     PT LONG TERM GOAL #2   Title TUG < 1 min with least restrictive AD for decreased risk of falls   Baseline 1 min 38 sec   Time 8   Period Weeks   Status New     PT LONG TERM GOAL #3   Title Pt able to walk with LRAD, upright posture, and improved stride length due to increased LE strength.   Baseline flexed posture and small stride length with rolling walker   Time 8   Period Weeks   Status New     PT LONG TERM GOAL #4   Title five times sit to stand decreased to < or = to 23 sec due to imporved LE strength   Baseline 33 sec   Time 8   Period Weeks   Status New               Plan - 10/31/16 1148    Clinical Impression Statement Patient walked into therapy with a rolling walker instead of a wheelchair.  She complained of right leg pain and had popping noise in right knee with exercise.  Patient walks very slowly and fatiques easily.  Patient needs frequent rests with exercise.  No standing exercises due to right leg pain and popping in right knee. Patient will benefit from skilled therapy to improve strength and endurance.    Rehab Potential Good   Clinical Impairments Affecting Rehab Potential left TKR 2015, blind in right eye, RA, hypertension; s/p Left anterior total hip replacement on 07/28/3016   PT Frequency 1x / week   PT Duration 8 weeks   PT  Treatment/Interventions ADLs/Self Care Home Management;Biofeedback;Cryotherapy;Electrical Stimulation;Iontophoresis 4mg /ml Dexamethasone;Moist Heat;Ultrasound;Gait training;Stair training;Functional mobility training;Therapeutic activities;Therapeutic  exercise;Balance training;Neuromuscular re-education;Patient/family education;Manual techniques;Passive range of motion;Dry needling;Taping   PT Next Visit Plan see what MD says about popping in right knee; issue a HEP; nu step; be careful of standing ex due to right leg pain   PT Home Exercise Plan progress as needed   Recommended Other Services cert signed by MD on 10/19/2016   Consulted and Agree with Plan of Care Patient;Family member/caregiver;Other (Comment)  interpretor   Family Member Consulted daughter-in-law      Patient will benefit from skilled therapeutic intervention in order to improve the following deficits and impairments:  Abnormal gait, Decreased activity tolerance, Decreased balance, Difficulty walking, Decreased strength, Postural dysfunction, Pain  Visit Diagnosis: Muscle weakness (generalized)  Difficulty in walking, not elsewhere classified  Pain in left hip  Pain in right hip     Problem List Patient Active Problem List   Diagnosis Date Noted  . Hip arthritis 07/28/2016  . Primary osteoarthritis of left hip 06/15/2016  . Nausea with vomiting 07/21/2013  . Unspecified constipation 07/21/2013  . Abdominal pain, epigastric 07/21/2013  . Arthritis of knee 07/15/2013  . Arthritis of left knee 04/15/2013    Eulis Foster, PT 10/31/16 1:08 PM    [facility] [address], [address] [place], [place], [address] Phone: [phone]   Fax:  [phone]  Name: SHALAINE FRYMOYER MRN: [id number] Date of Birth: 1940/12/10

## 2016-11-01 ENCOUNTER — Ambulatory Visit (INDEPENDENT_AMBULATORY_CARE_PROVIDER_SITE_OTHER): Payer: Medicaid Other | Admitting: Orthopedic Surgery

## 2016-11-01 ENCOUNTER — Encounter (INDEPENDENT_AMBULATORY_CARE_PROVIDER_SITE_OTHER): Payer: Self-pay | Admitting: Orthopedic Surgery

## 2016-11-01 DIAGNOSIS — M25552 Pain in left hip: Secondary | ICD-10-CM

## 2016-11-04 NOTE — Progress Notes (Signed)
Post-Op Visit Note   Patient: Leslie Ball           Date of Birth: [birth year]           MRN: [id number] Visit Date: 11/01/2016 PCP: Leslie Contras, MD   Assessment & Plan:  Chief Complaint:  Chief Complaint  Patient presents with  . Left Hip - Follow-up   Visit Diagnoses:  1. Pain of left hip joint     Plan: [name] is a patient with left hip replacement performed 3 months ago.  Started physical therapy this week.  Still is not walking much.  She does use a walker at home.  She still reports pain in both legs.  She does have pretty significant right hip arthritis.  She is concerned about clicking in the right knee.  Exam of the right knee and she has excellent range of motion stable collateral ligaments excellent patellar tracking no pain with range of motion and no real unusual mechanical abnormalities with the knee.  Patella tracks very well.  Right hip directly has range of motion motion limitations as well as pain with range of motion left hip no pain.  Hip flexion strength is a little bit diminished bilaterally.  Plan at this time is to continue with physical therapy 1 time a week plus a home exercise program.  She taking Ultram about 4 day.  No real further intervention to be done at this time.  She has lost some weight because of a virus.  I'll see her back in 6 months for clinical recheck.  Follow-Up Instructions: Return in about 6 months (around 05/03/2017).   Orders:  No orders of the defined types were placed in this encounter.  No orders of the defined types were placed in this encounter.   Imaging: No results found.  PMFS History: Patient Active Problem List   Diagnosis Date Noted  . Hip arthritis 07/28/2016  . Primary osteoarthritis of left hip 06/15/2016  . Nausea with vomiting 07/21/2013  . Unspecified constipation 07/21/2013  . Abdominal pain, epigastric 07/21/2013  . Arthritis of knee 07/15/2013  . Arthritis of left knee 04/15/2013   Past Medical  History:  Diagnosis Date  . Anemia   . Arthritis    "left knee; probably left shoulder & elbow" (07/15/2013)  . Asthma   . 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 Constipation    occasionally  . GERD (gastroesophageal reflux disease)    rare use of Pepto bismol  . Hypertension   . Neuromuscular disorder (HCC)    right hand has "pins and needles" feeling at times  . RA (rheumatoid arthritis) (HCC)     No family history on file.  Past Surgical History:  Procedure Laterality Date  . CATARACT EXTRACTION W/ INTRAOCULAR LENS IMPLANT Bilateral   . EYE SURGERY Right   . JOINT REPLACEMENT    . REFRACTIVE SURGERY Bilateral   . RETINAL DETACHMENT SURGERY Right   . TOTAL HIP ARTHROPLASTY Left 07/28/2016   Procedure: LEFT ANTERIOR TOTAL HIP ARTHROPLASTY;  Surgeon: Cammy Copa, MD;  Location: [facility] OR;  Service: Orthopedics;  Laterality: Left;  . TOTAL KNEE ARTHROPLASTY Right 04/15/2013   Procedure: TOTAL KNEE ARTHROPLASTY;  Surgeon: Cammy Copa, MD;  Location: [facility] OR;  Service: Orthopedics;  Laterality: Right;  . TOTAL KNEE ARTHROPLASTY Left 07/15/2013  . TOTAL KNEE ARTHROPLASTY Left 07/15/2013  Procedure: TOTAL KNEE ARTHROPLASTY- left;  Surgeon: Cammy Copa, MD;  Location: [facility] OR;  Service: Orthopedics;  Laterality: Left;  . TOTAL KNEE ARTHROPLASTY Left 07/28/2016   Social History   Occupational History  . Not on file.   Social History Main Topics  . Smoking status: Never Smoker  . Smokeless tobacco: Never Used  . Alcohol use No  . Drug use: No  . Sexual activity: Not on file

## 2016-11-07 ENCOUNTER — Ambulatory Visit: Payer: Medicaid Other | Admitting: Rehabilitative and Restorative Service Providers"

## 2016-11-07 DIAGNOSIS — R262 Difficulty in walking, not elsewhere classified: Secondary | ICD-10-CM

## 2016-11-07 DIAGNOSIS — M6281 Muscle weakness (generalized): Secondary | ICD-10-CM

## 2016-11-07 DIAGNOSIS — M25551 Pain in right hip: Secondary | ICD-10-CM

## 2016-11-07 DIAGNOSIS — H53413 Scotoma involving central area, bilateral: Secondary | ICD-10-CM

## 2016-11-07 DIAGNOSIS — M25552 Pain in left hip: Secondary | ICD-10-CM

## 2016-11-07 NOTE — Therapy (Signed)
[facility] [address], [address] [place], [place], [address] Phone: [phone]   Fax:  [phone]  Physical Therapy Treatment  Patient Details  Name: Leslie Ball MRN: [id number] Date of Birth: [birth year] Referring Provider: Cammy Copa  Encounter Date: 11/07/2016      PT End of Session - 11/07/16 1203    Visit Number 3   Number of Visits 8   Date for PT Re-Evaluation 12/28/16   Authorization Type medicaid 10/23/2016-12/17/2016 8 visits approved plus the eval equals 9 total   Authorization - Visit Number 3   PT Start Time 1105   PT Stop Time 1150   PT Time Calculation (min) 45 min   Activity Tolerance Patient tolerated treatment well;No increased pain   Behavior During Therapy WFL for tasks assessed/performed      Past Medical History:  Diagnosis Date  . Anemia   . Arthritis    "left knee; probably left shoulder & elbow" (07/15/2013)  . Asthma   . 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 Constipation    occasionally  . GERD (gastroesophageal reflux disease)    rare use of Pepto bismol  . Hypertension   . Neuromuscular disorder (HCC)    right hand has "pins and needles" feeling at times  . RA (rheumatoid arthritis) (HCC)     Past Surgical History:  Procedure Laterality Date  . CATARACT EXTRACTION W/ INTRAOCULAR LENS IMPLANT Bilateral   . EYE SURGERY Right   . JOINT REPLACEMENT    . REFRACTIVE SURGERY Bilateral   . RETINAL DETACHMENT SURGERY Right   . TOTAL HIP ARTHROPLASTY Left 07/28/2016   Procedure: LEFT ANTERIOR TOTAL HIP ARTHROPLASTY;  Surgeon: Cammy Copa, MD;  [facility];  Service: Orthopedics;  Laterality: Left;  . TOTAL KNEE ARTHROPLASTY Right 04/15/2013   Procedure: TOTAL KNEE ARTHROPLASTY;  Surgeon: Cammy Copa, MD;  Location: [facility] OR;  Service: Orthopedics;  Laterality: Right;  . TOTAL KNEE  ARTHROPLASTY Left 07/15/2013  . TOTAL KNEE ARTHROPLASTY Left 07/15/2013   Procedure: TOTAL KNEE ARTHROPLASTY- left;  Surgeon: Cammy Copa, MD;  Location: [facility] OR;  Service: Orthopedics;  Laterality: Left;  . TOTAL KNEE ARTHROPLASTY Left 07/28/2016    There were no vitals filed for this visit.      Subjective Assessment - 11/07/16 1152    Subjective Doctor said my R hip is OK. Pt brought in a new order stating R hip WBAT, no precautions. I am doing good.   Patient is accompained by: Interpreter   Pertinent History left anterior THR 07/28/2016, blind in right eye, Decreased vision in left eye; RA, hypertension; bil. TKR   Patient Stated Goals get rid of the walker, strength   Currently in Pain? Yes   Pain Score 4    Pain Location Hip   Pain Orientation Left   Pain Descriptors / Indicators Aching   Pain Onset More than a month ago   Aggravating Factors  standing and walking   Pain Relieving Factors pain meds, sit down to rest   Effect of Pain on Daily Activities difficulty with standing, walking, and sleeping   Multiple Pain Sites No                         [facility] Adult PT Treatment/Exercise - 11/07/16 0001      Knee/Hip  Exercises: Standing   Other Standing Knee Exercises pt able to tolerate standing on R LE this visit; hip ext x 10 with glute set; hamstring curl x 10 with glute set     Knee/Hip Exercises: Supine   Other Supine Knee/Hip Exercises all performed to L side unless indicated: quad sets x 10, SLR AAROM 3x5, 2 lb SAQ x 20, isometric 2 lb SAQ with ankle pump x 20; isometric 2 lb SAQ with hip abduction and adduction x 20; bil glute set with clam shell x 20; L glute set with clam shell x 20; bridge x 20 with glute set                  PT Short Term Goals - 11/07/16 1209      PT SHORT TERM GOAL #1   Title independent with initial HEP   Time 4   Period Weeks   Status On-going     PT SHORT TERM GOAL #2   Title reports 25% less pain in  bilateral hip due to improved strength and improved gait   Time 4   Period Weeks   Status On-going           PT Long Term Goals - 11/07/16 1209      PT LONG TERM GOAL #1   Title FOTO score reduced to < or = to 59% limited   Time 8   Period Weeks   Status On-going     PT LONG TERM GOAL #2   Title TUG < 1 min with least restrictive AD for decreased risk of falls   Time 8   Period Weeks   Status On-going     PT LONG TERM GOAL #3   Title Pt able to walk with LRAD, upright posture, and improved stride length due to increased LE strength.   Time 8   Period Weeks   Status On-going     PT LONG TERM GOAL #4   Time 8   Period Weeks   Status On-going               Plan - 11/07/16 1205    Clinical Impression Statement Pt presents to PT with L LE weakness with need to even have assist to get bil LEs onto bed. Pt with no c/o R hip pain this visit. MD stated pt OK to WB on R LE with no limitations (scanned into system). Pt quick to fatigue but very cooperative with treatment and gracious for therapy. Pt would benefit from further therapy for LE strengthening and endurance therex to increase mobility and decrease falls risk.. Pt declined ice/heat at end of session   Rehab Potential Good   Clinical Impairments Affecting Rehab Potential left TKR 2015, blind in right eye, RA, hypertension; s/p Left anterior total hip replacement on 07/28/3016   PT Frequency 1x / week   PT Duration 8 weeks   PT Treatment/Interventions ADLs/Self Care Home Management;Biofeedback;Cryotherapy;Electrical Stimulation;Iontophoresis 4mg /ml Dexamethasone;Moist Heat;Ultrasound;Gait training;Stair training;Functional mobility training;Therapeutic activities;Therapeutic exercise;Balance training;Neuromuscular re-education;Patient/family education;Manual techniques;Passive range of motion;Dry needling;Taping   PT Next Visit Plan issue HEP   PT Home Exercise Plan progress as needed   Consulted and Agree with Plan of  Care Patient;Family member/caregiver;Other (Comment)   Family Member Consulted daughter-in-law      Patient will benefit from skilled therapeutic intervention in order to improve the following deficits and impairments:  Abnormal gait, Decreased activity tolerance, Decreased balance, Difficulty walking, Decreased strength, Postural dysfunction, Pain  Visit Diagnosis: Muscle  weakness (generalized)  Difficulty in walking, not elsewhere classified  Pain in left hip  Pain in right hip  Scotoma involving central area, bilateral     Problem List Patient Active Problem List   Diagnosis Date Noted  . Hip arthritis 07/28/2016  . Primary osteoarthritis of left hip 06/15/2016  . Nausea with vomiting 07/21/2013  . Unspecified constipation 07/21/2013  . Abdominal pain, epigastric 07/21/2013  . Arthritis of knee 07/15/2013  . Arthritis of left knee 04/15/2013    ARTIS,Bricia Taher, PT 11/07/2016, 12:12 PM  [facility] [address], [address] [place], [place], [address] Phone: [phone]   Fax:  [phone]  Name: Leslie Ball MRN: [id number] Date of Birth: 1940/07/04

## 2016-11-14 ENCOUNTER — Ambulatory Visit: Payer: Medicaid Other

## 2016-11-14 DIAGNOSIS — M6281 Muscle weakness (generalized): Secondary | ICD-10-CM

## 2016-11-14 DIAGNOSIS — M25551 Pain in right hip: Secondary | ICD-10-CM

## 2016-11-14 DIAGNOSIS — M25552 Pain in left hip: Secondary | ICD-10-CM

## 2016-11-14 DIAGNOSIS — R262 Difficulty in walking, not elsewhere classified: Secondary | ICD-10-CM

## 2016-11-14 NOTE — Patient Instructions (Addendum)
KNEE: Extension, Long Arc Quad (Weight)  Place weight around leg. Raise leg until knee is straight. Hold _5__ seconds. Use ___ lb weight. _10__ reps per set (each leg), 1-2_ sets per day, __7_ days per week  Copyright  VHI. All rights reserved.        Knee Raise   Lift knee and then lower it. Repeat with other knee. Repeat _10__ times each leg. Do _1-2___ sessions per day.  http://gt2.exer.us/445   Copyright  VHI. All rights reserved.  Toe Up   Gently rise up on toes and back on heels. Repeat _20___ times. Do 1-2____ sessions per day.  Adduction: Hip - Knees Together (Sitting)    Sit with towel roll between knees. Push knees together. Hold for _5__ seconds. Rest for ___ seconds. Repeat _10__ times. Do _1-2__ times a day.  Copyright  VHI. All rights reserved.    [facility] [address] [place], [place] [address] Phone # [phone] Fax [phone]

## 2016-11-14 NOTE — Therapy (Signed)
[facility] [address] [place], [place], [address] Phone: [phone]   Fax:  [phone]  Physical Therapy Treatment  Patient Details  Name: Leslie Ball MRN: [id number] Date of Birth: 07-17-1940 Referring Provider: Cammy Copa  Encounter Date: 11/14/2016      PT End of Session - 11/14/16 1236    Visit Number 4   Number of Visits 8   Date for PT Re-Evaluation 12/28/16   Authorization Type medicaid 10/23/2016-12/17/2016 8 visits approved plus the eval equals 9 total   Authorization - Visit Number 4   Authorization - Number of Visits 9   PT Start Time 1146   PT Stop Time 1231   PT Time Calculation (min) 45 min   Activity Tolerance Patient tolerated treatment well;Patient limited by pain   Behavior During Therapy [facility] for tasks assessed/performed      Past Medical History:  Diagnosis Date  . Anemia   . Arthritis    "left knee; probably left shoulder & elbow" (07/15/2013)  . Asthma   . 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 Constipation    occasionally  . GERD (gastroesophageal reflux disease)    rare use of Pepto bismol  . Hypertension   . Neuromuscular disorder (HCC)    right hand has "pins and needles" feeling at times  . RA (rheumatoid arthritis) (HCC)     Past Surgical History:  Procedure Laterality Date  . CATARACT EXTRACTION W/ INTRAOCULAR LENS IMPLANT Bilateral   . EYE SURGERY Right   . JOINT REPLACEMENT    . REFRACTIVE SURGERY Bilateral   . RETINAL DETACHMENT SURGERY Right   . TOTAL HIP ARTHROPLASTY Left 07/28/2016   Procedure: LEFT ANTERIOR TOTAL HIP ARTHROPLASTY;  Surgeon: Cammy Copa, MD;  Location: [facility] OR;  Service: Orthopedics;  Laterality: Left;  . TOTAL KNEE ARTHROPLASTY Right 04/15/2013   Procedure: TOTAL KNEE ARTHROPLASTY;  Surgeon: Cammy Copa, MD;  Location: [facility] OR;  Service: Orthopedics;   Laterality: Right;  . TOTAL KNEE ARTHROPLASTY Left 07/15/2013  . TOTAL KNEE ARTHROPLASTY Left 07/15/2013   Procedure: TOTAL KNEE ARTHROPLASTY- left;  Surgeon: Cammy Copa, MD;  Location[facility] OR;  Service: Orthopedics;  Laterality: Left;  . TOTAL KNEE ARTHROPLASTY Left 07/28/2016    There were no vitals filed for this visit.      Subjective Assessment - 11/14/16 1203    Subjective Walking with walker for all distances.   Pertinent History left anterior THR 07/28/2016, blind in right eye, Decreased vision in left eye; RA, hypertension; bil. TKR   Currently in Pain? Yes   Pain Score 5    Pain Location Hip   Pain Orientation Right   Pain Descriptors / Indicators Aching   Pain Type Surgical pain   Pain Onset More than a month ago   Pain Frequency Constant   Aggravating Factors  standing and walking   Pain Relieving Factors pain medication, sitting down            [facility] PT Assessment - 11/14/16 0001      Timed Up and Go Test   Normal TUG (seconds) 99                     [facility] Adult PT Treatment/Exercise - 11/14/16 0001      Knee/Hip Exercises: Aerobic   Nustep Level 1 x  5 minutes     Knee/Hip Exercises: Standing   Other Standing Knee Exercises pt able to tolerate standing on R LE this visit; hip ext x 10 with glute set; hamstring curl x 10 with glute set     Knee/Hip Exercises: Seated   Long Arc Quad Strengthening;Both;10 reps   [employer] x30   Marching Limitations 2x10 bil.  Assistance needed with Rt LE   Abd/Adduction Limitations heel/toe raises x 20     Knee/Hip Exercises: Supine   Short Arc Quad Sets --   Heel Slides --   Adella Nissen Limitations --                PT Education - 11/14/16 1228    Education provided Yes   Education Details seated strength   Person(s) Educated Patient;Caregiver(s);Other (comment)   Methods Explanation;Demonstration;Tactile cues;Handout   Comprehension Verbalized understanding;Returned  demonstration;Tactile cues required;Need further instruction          PT Short Term Goals - 11/14/16 1204      PT SHORT TERM GOAL #1   Title independent with initial HEP   Status Achieved     PT SHORT TERM GOAL #2   Title reports 25% less pain in bilateral hip due to improved strength and improved gait   Time 4   Period Weeks   Status On-going           PT Long Term Goals - 11/07/16 1209      PT LONG TERM GOAL #1   Title FOTO score reduced to < or = to 59% limited   Time 8   Period Weeks   Status On-going     PT LONG TERM GOAL #2   Title TUG < 1 min with least restrictive AD for decreased risk of falls   Time 8   Period Weeks   Status On-going     PT LONG TERM GOAL #3   Title Pt able to walk with LRAD, upright posture, and improved stride length due to increased LE strength.   Time 8   Period Weeks   Status On-going     PT LONG TERM GOAL #4   Time 8   Period Weeks   Status On-going               Plan - 11/14/16 1233    Clinical Impression Statement Pt with slow mobility throughout session.  Frequent tactile and verbal cues required.  PT issued HEP for seated strength.  TUG was 1 minute 39 seconds.  Pt with difficulty performing hip flexion on Rt in sitting. Pt would benefit from skilled PT for strength, endurance, gait and mobility.     Rehab Potential Good   Clinical Impairments Affecting Rehab Potential left TKR 2015, blind in right eye, RA, hypertension; s/p Left anterior total hip replacement on 07/28/3016   PT Frequency 1x / week   PT Duration 8 weeks   PT Treatment/Interventions ADLs/Self Care Home Management;Biofeedback;Cryotherapy;Electrical Stimulation;Iontophoresis 4mg /ml Dexamethasone;Moist Heat;Ultrasound;Gait training;Stair training;Functional mobility training;Therapeutic activities;Therapeutic exercise;Balance training;Neuromuscular re-education;Patient/family education;Manual techniques;Passive range of motion;Dry needling;Taping   PT Next  Visit Plan Reveiw HEP, continue LE strength   Consulted and Agree with Plan of Care Patient;Family member/caregiver   Family Member Consulted daughter-in-law      Patient will benefit from skilled therapeutic intervention in order to improve the following deficits and impairments:  Abnormal gait, Decreased activity tolerance, Decreased balance, Difficulty walking, Decreased strength, Postural dysfunction, Pain  Visit Diagnosis: Muscle weakness (  generalized)  Difficulty in walking, not elsewhere classified  Pain in left hip  Pain in right hip     Problem List Patient Active Problem List   Diagnosis Date Noted  . Hip arthritis 07/28/2016  . Primary osteoarthritis of left hip 06/15/2016  . Nausea with vomiting 07/21/2013  . Unspecified constipation 07/21/2013  . Abdominal pain, epigastric 07/21/2013  . Arthritis of knee 07/15/2013  . Arthritis of left knee 04/15/2013     Lorrene Reid, PT 11/14/16 12:39 PM   [facility] [address], [address] [place], [place], [address] Phone: [phone]   Fax:  [phone]  Name: Leslie Ball MRN: [id number] Date of Birth: [birth year]

## 2016-11-21 ENCOUNTER — Ambulatory Visit: Payer: Medicaid Other | Attending: Orthopedic Surgery

## 2016-11-21 DIAGNOSIS — M25552 Pain in left hip: Secondary | ICD-10-CM | POA: Diagnosis present

## 2016-11-21 DIAGNOSIS — M6281 Muscle weakness (generalized): Secondary | ICD-10-CM | POA: Insufficient documentation

## 2016-11-21 DIAGNOSIS — R262 Difficulty in walking, not elsewhere classified: Secondary | ICD-10-CM | POA: Diagnosis present

## 2016-11-21 DIAGNOSIS — M25551 Pain in right hip: Secondary | ICD-10-CM | POA: Insufficient documentation

## 2016-11-21 NOTE — Therapy (Signed)
[facility] [address], [address] [place], [place], [address] Phone: [phone]   Fax:  [phone]  Physical Therapy Treatment  Patient Details  Name: Leslie Ball MRN: [id number] Date of Birth: 1940/10/06 Referring Provider: Cammy Copa  Encounter Date: 11/21/2016      PT End of Session - 11/21/16 1142    Visit Number 5   Number of Visits 9   Date for PT Re-Evaluation 12/28/16   Authorization Type medicaid 10/23/2016-12/17/2016 8 visits approved plus the eval equals 9 total   Authorization - Visit Number 5   Authorization - Number of Visits 9   PT Start Time 1105   PT Stop Time 1140  fatigue and rest breaks   PT Time Calculation (min) 35 min   Activity Tolerance Patient limited by fatigue   Behavior During Therapy [facility] for tasks assessed/performed      Past Medical History:  Diagnosis Date  . Anemia   . Arthritis    "left knee; probably left shoulder & elbow" (07/15/2013)  . Asthma   . 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 Constipation    occasionally  . GERD (gastroesophageal reflux disease)    rare use of Pepto bismol  . Hypertension   . Neuromuscular disorder (HCC)    right hand has "pins and needles" feeling at times  . RA (rheumatoid arthritis) (HCC)     Past Surgical History:  Procedure Laterality Date  . CATARACT EXTRACTION W/ INTRAOCULAR LENS IMPLANT Bilateral   . EYE SURGERY Right   . JOINT REPLACEMENT    . REFRACTIVE SURGERY Bilateral   . RETINAL DETACHMENT SURGERY Right   . TOTAL HIP ARTHROPLASTY Left 07/28/2016   Procedure: LEFT ANTERIOR TOTAL HIP ARTHROPLASTY;  Surgeon: Cammy Copa, MD;  Location: [facility] OR;  Service: Orthopedics;  Laterality: Left;  . TOTAL KNEE ARTHROPLASTY Right 04/15/2013   Procedure: TOTAL KNEE ARTHROPLASTY;  Surgeon: Cammy Copa, MD;  Location: [facility] OR;  Service: Orthopedics;   Laterality: Right;  . TOTAL KNEE ARTHROPLASTY Left 07/15/2013  . TOTAL KNEE ARTHROPLASTY Left 07/15/2013   Procedure: TOTAL KNEE ARTHROPLASTY- left;  Surgeon: Cammy Copa, MD;  Location: [facility] OR;  Service: Orthopedics;  Laterality: Left;  . TOTAL KNEE ARTHROPLASTY Left 07/28/2016    There were no vitals filed for this visit.      Subjective Assessment - 11/21/16 1109    Subjective Pt's daughter in law states that she has been doing her exercises.     Pertinent History left anterior THR 07/28/2016, blind in right eye, Decreased vision in left eye; RA, hypertension; bil. TKR   Patient Stated Goals get rid of the walker, strength   Currently in Pain? Yes   Pain Score 6    Pain Location Hip   Pain Orientation Right   Pain Descriptors / Indicators Aching   Pain Type Surgical pain   Pain Onset More than a month ago   Pain Frequency Constant   Aggravating Factors  standing and walking   Pain Relieving Factors pain medication, sitting down            [facility] PT Assessment - 11/21/16 0001      Assessment   Medical Diagnosis Z96.649 History of hip replacement unspcified laterality   Prior Therapy 1 home health visit after surgery     Home Environment   Living  Environment Private residence   Living Arrangements Alone  son helps her as needed   Type of Home House     Cognition   Overall Cognitive Status Within Functional Limits for tasks assessed     Strength   Overall Strength Comments Rt hip flexion in sitting 3-/5, difficulty with independently lifting Rt LE against gravity     Transfers   Transfers Sit to Stand;Stand to Sit   Sit to Stand 5: Supervision;4: Min guard;With upper extremity assist;With armrests   Sit to Stand Details Tactile cues for initiation;Tactile cues for sequencing;Verbal cues for sequencing   Stand to Sit 5: Supervision;4: Min guard;With upper extremity assist;With armrests   Stand to Sit Details (indicate cue type and reason) Tactile cues for  initiation;Verbal cues for sequencing;Verbal cues for technique     Ambulation/Gait   Ambulation/Gait Assistance 6: Modified independent (Device/Increase time)   Ambulation Distance (Feet) 150 Feet   Assistive device Rolling walker   Gait Pattern Decreased stride length;Trunk flexed     Timed Up and Go Test   TUG Normal TUG   Normal TUG (seconds) 99                     [facility] Adult PT Treatment/Exercise - 11/21/16 0001      Knee/Hip Exercises: Aerobic   Nustep Level 1 x 5 minutes  PT present, rest breaks taken     Knee/Hip Exercises: Standing   Heel Raises Both;20 reps   Hip Abduction Stengthening;Both;2 sets;10 reps   Hip Extension Stengthening;Both;2 sets;10 reps     Knee/Hip Exercises: Seated   Long Arc Quad Strengthening;Both;10 reps   Ball Squeeze x30   Marching Limitations 2x10 bil.  Assistance needed with Rt LE   Abd/Adduction Limitations seated rockerboard x 2 minutes                  PT Short Term Goals - 11/21/16 1110      PT SHORT TERM GOAL #1   Title independent with initial HEP   Status Achieved     PT SHORT TERM GOAL #2   Title reports 25% less pain in bilateral hip due to improved strength and improved gait   Time 4   Period Weeks   Status On-going           PT Long Term Goals - 11/21/16 1110      PT LONG TERM GOAL #1   Title FOTO score reduced to < or = to 59% limited   Time 4   Period Weeks   Status On-going     PT LONG TERM GOAL #2   Title TUG < 1 min with least restrictive AD for decreased risk of falls   Time 4   Period Weeks   Status On-going     PT LONG TERM GOAL #3   Title Pt able to walk with LRAD, upright posture, and improved stride length due to increased LE strength.   Time 4   Period Weeks   Status On-going     PT LONG TERM GOAL #4   Title five times sit to stand decreased to < or = to 23 sec due to imporved LE strength   Baseline 33 sec   Time 4   Period Weeks   Status On-going                Plan - 11/21/16 1111    Clinical Impression Statement Pt with slow mobility throughout session with  frequent rest breaks needed due to pain and fatigue. Pt is doing HEP at home for strength and mobility.  Pt with continued weakness and difficult with sit to stand, slow gait, flexed trunk and use of walker.  Pt will benefit from continued therapy for strength, endurance and gait.     Rehab Potential Good   Clinical Impairments Affecting Rehab Potential left TKR 2015, blind in right eye, RA, hypertension; s/p Left anterior total hip replacement on 07/28/3016   PT Frequency 2x / week   PT Duration 4 weeks   PT Treatment/Interventions ADLs/Self Care Home Management;Biofeedback;Cryotherapy;Electrical Stimulation;Iontophoresis 4mg /ml Dexamethasone;Moist Heat;Ultrasound;Gait training;Stair training;Functional mobility training;Therapeutic activities;Therapeutic exercise;Balance training;Neuromuscular re-education;Patient/family education;Manual techniques;Passive range of motion;Dry needling;Taping   PT Next Visit Plan Reveiw HEP, continue LE strength   Consulted and Agree with Plan of Care Patient;Family member/caregiver   Family Member Consulted daughter-in-law      Patient will benefit from skilled therapeutic intervention in order to improve the following deficits and impairments:  Abnormal gait, Decreased activity tolerance, Decreased balance, Difficulty walking, Decreased strength, Postural dysfunction, Pain  Visit Diagnosis: Muscle weakness (generalized) - Plan: PT plan of care cert/re-cert  Difficulty in walking, not elsewhere classified - Plan: PT plan of care cert/re-cert  Pain in left hip - Plan: PT plan of care cert/re-cert  Pain in right hip - Plan: PT plan of care cert/re-cert     Problem List Patient Active Problem List   Diagnosis Date Noted  . Hip arthritis 07/28/2016  . Primary osteoarthritis of left hip 06/15/2016  . Nausea with vomiting 07/21/2013  .  Unspecified constipation 07/21/2013  . Abdominal pain, epigastric 07/21/2013  . Arthritis of knee 07/15/2013  . Arthritis of left knee 04/15/2013    Lorrene Reid, PT 11/21/16 11:47 AM  [facility] [address], [address] [place], [place], [address] Phone: [phone]   Fax:  [phone]  Name: Leslie Ball MRN: [id number] Date of Birth: [birth year]

## 2016-11-28 ENCOUNTER — Ambulatory Visit: Payer: Medicaid Other

## 2016-11-28 DIAGNOSIS — M6281 Muscle weakness (generalized): Secondary | ICD-10-CM

## 2016-11-28 DIAGNOSIS — M25551 Pain in right hip: Secondary | ICD-10-CM

## 2016-11-28 DIAGNOSIS — R262 Difficulty in walking, not elsewhere classified: Secondary | ICD-10-CM

## 2016-11-28 DIAGNOSIS — M25552 Pain in left hip: Secondary | ICD-10-CM

## 2016-11-28 NOTE — Therapy (Signed)
[facility] [address], [address] [place], [place], [address] Phone: [phone]   Fax:  [phone]  Physical Therapy Treatment  Patient Details  Name: Leslie Ball MRN: [id number] Date of Birth: 05-28-1940 Referring Provider: Cammy Copa  Encounter Date: 11/28/2016      PT End of Session - 11/28/16 1150    Visit Number 6   Number of Visits 9   Date for PT Re-Evaluation 12/28/16   Authorization Type medicaid 10/23/2016-12/17/2016 8 visits approved plus the eval equals 9 total   Authorization - Visit Number 6   Authorization - Number of Visits 9   PT Start Time 1100   PT Stop Time 1159  slow mobility and rests taken   PT Time Calculation (min) 59 min   Activity Tolerance Patient limited by fatigue;Patient limited by pain   Behavior During Therapy [facility] for tasks assessed/performed      Past Medical History:  Diagnosis Date  . Anemia   . Arthritis    "left knee; probably left shoulder & elbow" (07/15/2013)  . Asthma   . 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 Constipation    occasionally  . GERD (gastroesophageal reflux disease)    rare use of Pepto bismol  . Hypertension   . Neuromuscular disorder (HCC)    right hand has "pins and needles" feeling at times  . RA (rheumatoid arthritis) (HCC)     Past Surgical History:  Procedure Laterality Date  . CATARACT EXTRACTION W/ INTRAOCULAR LENS IMPLANT Bilateral   . EYE SURGERY Right   . JOINT REPLACEMENT    . REFRACTIVE SURGERY Bilateral   . RETINAL DETACHMENT SURGERY Right   . TOTAL HIP ARTHROPLASTY Left 07/28/2016   Procedure: LEFT ANTERIOR TOTAL HIP ARTHROPLASTY;  Surgeon: Cammy Copa, MD;  Location: [facility] OR;  Service: Orthopedics;  Laterality: Left;  . TOTAL KNEE ARTHROPLASTY Right 04/15/2013   Procedure: TOTAL KNEE ARTHROPLASTY;  Surgeon: Cammy Copa, MD;  Location: [facility]  OR;  Service: Orthopedics;  Laterality: Right;  . TOTAL KNEE ARTHROPLASTY Left 07/15/2013  . TOTAL KNEE ARTHROPLASTY Left 07/15/2013   Procedure: TOTAL KNEE ARTHROPLASTY- left;  Surgeon: Cammy Copa, MD;  Location: [facility] OR;  Service: Orthopedics;  Laterality: Left;  . TOTAL KNEE ARTHROPLASTY Left 07/28/2016    There were no vitals filed for this visit.      Subjective Assessment - 11/28/16 1108    Subjective My legs feel heavy.     Pertinent History left anterior THR 07/28/2016, blind in right eye, Decreased vision in left eye; RA, hypertension; bil. TKR   Currently in Pain? Yes   Pain Score 6    Pain Location Hip   Pain Orientation Right   Pain Descriptors / Indicators Aching   Pain Type Surgical pain   Pain Onset More than a month ago   Aggravating Factors  standing and walking, sleep at night   Pain Relieving Factors pain medication, sitting down                         [facility] Adult PT Treatment/Exercise - 11/28/16 0001      Transfers   Five time sit to stand comments  attempted to do today and pt not able to perform 5th repetition due to fatigue.       Knee/Hip Exercises: Aerobic  Nustep Level 1 x 5 minutes  PT present, no rest breaks needed     Knee/Hip Exercises: Standing   Heel Raises Both;20 reps   Hip Abduction Stengthening;Both;2 sets;10 reps   Hip Extension Stengthening;Both;2 sets;10 reps     Knee/Hip Exercises: Seated   Long Arc Quad Strengthening;Both;10 reps;2 sets   [employer] x30   Marching Limitations 2x10 bil.  Able to do on Rt without assistance   Hamstring Limitations shoulder flexion 2x10   Abd/Adduction Limitations seated rockerboard x 2 minutes     Modalities   Modalities Electrical Stimulation;Moist Heat     Moist Heat Therapy   Number Minutes Moist Heat 15 Minutes   Moist Heat Location Hip     Electrical Stimulation   Electrical Stimulation Location Rt hip and thigh   Electrical Stimulation Action IFC   Electrical  Stimulation Parameters 15 minutes   Electrical Stimulation Goals Pain                  PT Short Term Goals - 11/28/16 1116      PT SHORT TERM GOAL #2   Title reports 25% less pain in bilateral hip due to improved strength and improved gait   Time 4   Period Weeks   Status On-going           PT Long Term Goals - 11/28/16 1116      PT LONG TERM GOAL #2   Title TUG < 1 min with least restrictive AD for decreased risk of falls     PT LONG TERM GOAL #3   Title Pt able to walk with LRAD, upright posture, and improved stride length due to increased LE strength.   Baseline flexed posture and small stride length with rolling walker   Time 4   Period Weeks     PT LONG TERM GOAL #4   Title five times sit to stand decreased to < or = to 23 sec due to imporved LE strength   Time 4   Period Weeks   Status On-going               Plan - 11/28/16 1119    Clinical Impression Statement Pt with continued Rt hip pain that limits ability to participate in exercise.  Pt ambulates with improved upright posture and takes short steps without knee flexion.  Pt requires assistance with sit to stand at times and with movement of Rt LE.  Pt with improved ability to perform independent exercise on the Rt.  Pt will benefit from continued PT for LE strength, endurance and gait.   Rehab Potential Good   Clinical Impairments Affecting Rehab Potential left TKR 2015, blind in right eye, RA, hypertension; s/p Left anterior total hip replacement on 07/28/3016   PT Frequency 2x / week   PT Duration 4 weeks   PT Treatment/Interventions ADLs/Self Care Home Management;Biofeedback;Cryotherapy;Electrical Stimulation;Iontophoresis 4mg /ml Dexamethasone;Moist Heat;Ultrasound;Gait training;Stair training;Functional mobility training;Therapeutic activities;Therapeutic exercise;Balance training;Neuromuscular re-education;Patient/family education;Manual techniques;Passive range of motion;Dry needling;Taping    PT Next Visit Plan Reveiw HEP, continue LE strength.  See how pt responds to e-stim   Consulted and Agree with Plan of Care Patient      Patient will benefit from skilled therapeutic intervention in order to improve the following deficits and impairments:  Abnormal gait, Decreased activity tolerance, Decreased balance, Difficulty walking, Decreased strength, Postural dysfunction, Pain  Visit Diagnosis: Muscle weakness (generalized)  Difficulty in walking, not elsewhere classified  Pain in left hip  Pain in  right hip     Problem List Patient Active Problem List   Diagnosis Date Noted  . Hip arthritis 07/28/2016  . Primary osteoarthritis of left hip 06/15/2016  . Nausea with vomiting 07/21/2013  . Unspecified constipation 07/21/2013  . Abdominal pain, epigastric 07/21/2013  . Arthritis of knee 07/15/2013  . Arthritis of left knee 04/15/2013    Lorrene Reid, PT 11/28/16 11:51 AM  [facility] [address], [address] [place], [place], [address] Phone: [phone]   Fax:  [phone]  Name: Leslie Ball MRN: [id number] Date of Birth: May 22, 1941

## 2016-12-04 NOTE — Addendum Note (Signed)
Addendum  created 12/04/16 2035 by Brinson Tozzi, MD   Sign clinical note    

## 2016-12-05 ENCOUNTER — Ambulatory Visit: Payer: Medicaid Other

## 2016-12-05 DIAGNOSIS — M6281 Muscle weakness (generalized): Secondary | ICD-10-CM

## 2016-12-05 DIAGNOSIS — M25552 Pain in left hip: Secondary | ICD-10-CM

## 2016-12-05 DIAGNOSIS — R262 Difficulty in walking, not elsewhere classified: Secondary | ICD-10-CM

## 2016-12-05 DIAGNOSIS — M25551 Pain in right hip: Secondary | ICD-10-CM

## 2016-12-05 NOTE — Therapy (Signed)
[facility] [address] [place], [place], [address] Phone: [phone]   Fax:  [phone]  Physical Therapy Treatment  Patient Details  Name: Leslie Ball MRN: [id number] Date of Birth: [birth year] Referring Provider: Cammy Copa  Encounter Date: 12/05/2016      PT End of Session - 12/05/16 1143    Visit Number 7   Number of Visits 9   Date for PT Re-Evaluation 12/28/16   Authorization Type medicaid 10/23/2016-12/17/2016 8 visits approved plus the eval equals 9 total   Authorization - Visit Number 7   Authorization - Number of Visits 9   PT Start Time 1103   PT Stop Time 1200  slow mobility and rest breaks   PT Time Calculation (min) 57 min   Activity Tolerance Patient tolerated treatment well   Behavior During Therapy [facility] for tasks assessed/performed      Past Medical History:  Diagnosis Date  . Anemia   . Arthritis    "left knee; probably left shoulder & elbow" (07/15/2013)  . Asthma   . 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 Constipation    occasionally  . GERD (gastroesophageal reflux disease)    rare use of Pepto bismol  . Hypertension   . Neuromuscular disorder (HCC)    right hand has "pins and needles" feeling at times  . RA (rheumatoid arthritis) (HCC)     Past Surgical History:  Procedure Laterality Date  . CATARACT EXTRACTION W/ INTRAOCULAR LENS IMPLANT Bilateral   . EYE SURGERY Right   . JOINT REPLACEMENT    . REFRACTIVE SURGERY Bilateral   . RETINAL DETACHMENT SURGERY Right   . TOTAL HIP ARTHROPLASTY Left 07/28/2016   Procedure: LEFT ANTERIOR TOTAL HIP ARTHROPLASTY;  Surgeon: Cammy Copa, MD;  Location: [facility] OR;  Service: Orthopedics;  Laterality: Left;  . TOTAL KNEE ARTHROPLASTY Right 04/15/2013   Procedure: TOTAL KNEE ARTHROPLASTY;  Surgeon: Cammy Copa, MD;  Location: [facility] OR;  Service:  Orthopedics;  Laterality: Right;  . TOTAL KNEE ARTHROPLASTY Left 07/15/2013  . TOTAL KNEE ARTHROPLASTY Left 07/15/2013   Procedure: TOTAL KNEE ARTHROPLASTY- left;  Surgeon: Cammy Copa, MD;  Location: [facility] OR;  Service: Orthopedics;  Laterality: Left;  . TOTAL KNEE ARTHROPLASTY Left 07/28/2016    There were no vitals filed for this visit.      Subjective Assessment - 12/05/16 1108    Subjective The e-stim helped last time.     Pertinent History left anterior THR 07/28/2016, blind in right eye, Decreased vision in left eye; RA, hypertension; bil. TKR   Currently in Pain? Yes   Pain Score 6    Pain Location Leg   Pain Orientation Right   Pain Descriptors / Indicators Aching   Pain Type Surgical pain   Pain Onset More than a month ago   Pain Frequency Constant   Aggravating Factors  standing, walking, sleep at night   Pain Relieving Factors pain medication, sitting down                         [facility] Adult PT Treatment/Exercise - 12/05/16 0001      Knee/Hip Exercises: Aerobic   Nustep Level 1 x 6 minutes  PT present, no rest breaks needed     Knee/Hip Exercises: Standing   Hip Abduction Stengthening;Both;2 sets;10 reps  Rt only   Hip Extension Stengthening;Both;2 sets;10 reps  Rt only     Knee/Hip Exercises: Seated   Long Arc Quad Strengthening;Both;10 reps;3 sets   [employer] x30   Marching Limitations 2x10 bil.  Able to do on Rt without assistance   Hamstring Limitations shoulder flexion 2x10     Modalities   Modalities Electrical Stimulation;Moist Heat     Moist Heat Therapy   Number Minutes Moist Heat 15 Minutes   Moist Heat Location Hip     Electrical Stimulation   Electrical Stimulation Location Rt hip and thigh   Electrical Stimulation Action IFC   Electrical Stimulation Parameters 15 minutes   Electrical Stimulation Goals Pain                  PT Short Term Goals - 11/28/16 1116      PT SHORT TERM GOAL #2   Title reports  25% less pain in bilateral hip due to improved strength and improved gait   Time 4   Period Weeks   Status On-going           PT Long Term Goals - 12/05/16 1111      PT LONG TERM GOAL #3   Title Pt able to walk with LRAD, upright posture, and improved stride length due to increased LE strength.   Baseline improved posture, reduced step length.                 Plan - 12/05/16 1118    Clinical Impression Statement Pt with improved sit to stand transition today and improved ability to move Rt LE without support.  Pt with improved posture with gait and continues to take short steps due to weakness.  Pt with pain reduction with use of home TENs.  Pt will continue to benefit from skilled PT for strength, endurance, gait.    Clinical Impairments Affecting Rehab Potential left TKR 2015, blind in right eye, RA, hypertension; s/p Left anterior total hip replacement on 07/28/3016   PT Frequency 2x / week   PT Duration 4 weeks   PT Treatment/Interventions ADLs/Self Care Home Management;Biofeedback;Cryotherapy;Electrical Stimulation;Iontophoresis 4mg /ml Dexamethasone;Moist Heat;Ultrasound;Gait training;Stair training;Functional mobility training;Therapeutic activities;Therapeutic exercise;Balance training;Neuromuscular re-education;Patient/family education;Manual techniques;Passive range of motion;Dry needling;Taping   PT Next Visit Plan Reveiw HEP, continue LE strength. See if pt gets home TENs, show how to use if she does.  2 sessions approved and family will consider self-pay after this.   Consulted and Agree with Plan of Care Patient;Family member/caregiver   Family Member Consulted daughter-in-law      Patient will benefit from skilled therapeutic intervention in order to improve the following deficits and impairments:  Abnormal gait, Decreased activity tolerance, Decreased balance, Difficulty walking, Decreased strength, Postural dysfunction, Pain  Visit Diagnosis: Muscle weakness  (generalized)  Difficulty in walking, not elsewhere classified  Pain in left hip  Pain in right hip     Problem List Patient Active Problem List   Diagnosis Date Noted  . Hip arthritis 07/28/2016  . Primary osteoarthritis of left hip 06/15/2016  . Nausea with vomiting 07/21/2013  . Unspecified constipation 07/21/2013  . Abdominal pain, epigastric 07/21/2013  . Arthritis of knee 07/15/2013  . Arthritis of left knee 04/15/2013     04/17/2013, PT 12/05/16 11:45 AM  [facility] [address], [address] [place], [place], [place] Phone: [phone]   Fax:  [phone]  Name: Leslie Ball MRN: Kevan Rosebush Date of Birth: 15-May-1941

## 2016-12-11 ENCOUNTER — Telehealth (INDEPENDENT_AMBULATORY_CARE_PROVIDER_SITE_OTHER): Payer: Self-pay | Admitting: Orthopedic Surgery

## 2016-12-11 DIAGNOSIS — G8918 Other acute postprocedural pain: Secondary | ICD-10-CM

## 2016-12-11 NOTE — Telephone Encounter (Signed)
y

## 2016-12-11 NOTE — Telephone Encounter (Signed)
Do you want tot extend patients therapy?

## 2016-12-11 NOTE — Telephone Encounter (Signed)
Patient's son Doreatha Martin) called asked for a call back concerning (PT) for his mother. Sam said he is looking at extending her (PT) The number to contact Sam is [phone]

## 2016-12-11 NOTE — Telephone Encounter (Signed)
Put order in for extension. Patients son aware.

## 2016-12-12 ENCOUNTER — Ambulatory Visit: Payer: Medicaid Other

## 2016-12-12 DIAGNOSIS — M25551 Pain in right hip: Secondary | ICD-10-CM

## 2016-12-12 DIAGNOSIS — M6281 Muscle weakness (generalized): Secondary | ICD-10-CM

## 2016-12-12 DIAGNOSIS — R262 Difficulty in walking, not elsewhere classified: Secondary | ICD-10-CM

## 2016-12-12 DIAGNOSIS — M25552 Pain in left hip: Secondary | ICD-10-CM

## 2016-12-12 NOTE — Therapy (Signed)
[facility] [address] [place], [place], [address] Phone: [phone]   Fax:  [phone]  Physical Therapy Treatment  Patient Details  Name: Leslie Ball MRN: [id number] Date of Birth: 09/01/1940 Referring Provider: Cammy Copa  Encounter Date: 12/12/2016      PT End of Session - 12/12/16 1147    Visit Number 8   Number of Visits 9   Date for PT Re-Evaluation 12/28/16   Authorization Type medicaid 10/23/2016-12/17/2016 8 visits approved plus the eval equals 9 total   Authorization - Visit Number 8   Authorization - Number of Visits 9   PT Start Time 1105   PT Stop Time 1202  slow mobility and rest breaks taken   PT Time Calculation (min) 57 min   Activity Tolerance Patient tolerated treatment well   Behavior During Therapy [facility] for tasks assessed/performed      Past Medical History:  Diagnosis Date  . Anemia   . Arthritis    "left knee; probably left shoulder & elbow" (07/15/2013)  . Asthma   . 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 Constipation    occasionally  . GERD (gastroesophageal reflux disease)    rare use of Pepto bismol  . Hypertension   . Neuromuscular disorder (HCC)    right hand has "pins and needles" feeling at times  . RA (rheumatoid arthritis) (HCC)     Past Surgical History:  Procedure Laterality Date  . CATARACT EXTRACTION W/ INTRAOCULAR LENS IMPLANT Bilateral   . EYE SURGERY Right   . JOINT REPLACEMENT    . REFRACTIVE SURGERY Bilateral   . RETINAL DETACHMENT SURGERY Right   . TOTAL HIP ARTHROPLASTY Left 07/28/2016   Procedure: LEFT ANTERIOR TOTAL HIP ARTHROPLASTY;  Surgeon: Cammy Copa, MD;  Location: [facility] OR;  Service: Orthopedics;  Laterality: Left;  . TOTAL KNEE ARTHROPLASTY Right 04/15/2013   Procedure: TOTAL KNEE ARTHROPLASTY;  Surgeon: Cammy Copa, MD;  Location: [facility] OR;   Service: Orthopedics;  Laterality: Right;  . TOTAL KNEE ARTHROPLASTY Left 07/15/2013  . TOTAL KNEE ARTHROPLASTY Left 07/15/2013   Procedure: TOTAL KNEE ARTHROPLASTY- left;  Surgeon: Cammy Copa, MD;  Location: [facility] OR;  Service: Orthopedics;  Laterality: Left;  . TOTAL KNEE ARTHROPLASTY Left 07/28/2016    There were no vitals filed for this visit.      Subjective Assessment - 12/12/16 1111    Subjective My Rt leg really hurts me.  I had to walk a long way to get in here.     Pertinent History left anterior THR 07/28/2016, blind in right eye, Decreased vision in left eye; RA, hypertension; bil. TKR   Patient Stated Goals get rid of the walker, strength   Currently in Pain? Yes   Pain Score 7    Pain Location Leg   Pain Orientation Right   Pain Descriptors / Indicators Aching   Pain Type Chronic pain   Pain Onset More than a month ago   Pain Frequency Constant   Aggravating Factors  standing, walking, sleep at night   Pain Relieving Factors pain medication, sitting down   Multiple Pain Sites No                         [facility] Adult PT Treatment/Exercise - 12/12/16 0001      Knee/Hip  Exercises: Aerobic   Nustep Level 1 x 6 minutes  PT present, no rest breaks needed     Knee/Hip Exercises: Standing   Hip Abduction Stengthening;Both;2 sets;10 reps  Rt only   Hip Extension Stengthening;Both;2 sets;10 reps  Rt only     Knee/Hip Exercises: Seated   Long Arc Quad Strengthening;Both;10 reps;3 sets   [employer] x30   Marching Limitations 2x10 bil.  Able to do on Rt without assistance   Hamstring Limitations shoulder flexion 2x10   Abd/Adduction Limitations seated rockerboard x 2 minutes     Modalities   Modalities Electrical Stimulation;Cryotherapy     Cryotherapy   Number Minutes Cryotherapy 15 Minutes   Cryotherapy Location Hip   Type of Cryotherapy Ice pack     Electrical Stimulation   Electrical Stimulation Location Rt hip and thigh   Electrical  Stimulation Action IFC   Electrical Stimulation Parameters 15 minute   Electrical Stimulation Goals Pain                  PT Short Term Goals - 12/12/16 1119      PT SHORT TERM GOAL #2   Title reports 25% less pain in bilateral hip due to improved strength and improved gait   Time 4   Period Weeks   Status On-going           PT Long Term Goals - 12/12/16 1120      PT LONG TERM GOAL #1   Title FOTO score reduced to < or = to 59% limited   Baseline 73%   Time 4   Period Weeks   Status On-going     PT LONG TERM GOAL #3   Title Pt able to walk with LRAD, upright posture, and improved stride length due to increased LE strength.   Baseline improved posture, reduced step length.     Time 4   Period Weeks   Status On-going               Plan - 12/12/16 1121    Clinical Impression Statement Pt with slow mobility today and pt had many questions regarding future PT and payment when she is self-pay.  Pt with difficulty with sit to stand transition and with all transfers due to LE weakness.  Pt with continued Rt LE pain that limits functional mobility.  Pt with improved ability to lift Rt LE without UE suport today.  Pt has one more visit approved through insurance and will decide if she will continue with self-pay     Rehab Potential Good   Clinical Impairments Affecting Rehab Potential left TKR 2015, blind in right eye, RA, hypertension; s/p Left anterior total hip replacement on 07/28/3016   PT Frequency 2x / week   PT Duration 4 weeks   PT Treatment/Interventions ADLs/Self Care Home Management;Biofeedback;Cryotherapy;Electrical Stimulation;Iontophoresis 4mg /ml Dexamethasone;Moist Heat;Ultrasound;Gait training;Stair training;Functional mobility training;Therapeutic activities;Therapeutic exercise;Balance training;Neuromuscular re-education;Patient/family education;Manual techniques;Passive range of motion;Dry needling;Taping   PT Next Visit Plan Reveiw HEP, continue  LE strength. See if pt gets home TENs, show how to use if she does.  2 sessions approved and family will consider self-pay after this. Renewal if pt decides to continue.     Consulted and Agree with Plan of Care Patient   Family Member Consulted pt's son      Patient will benefit from skilled therapeutic intervention in order to improve the following deficits and impairments:  Abnormal gait, Decreased activity tolerance, Decreased balance, Difficulty walking, Decreased strength, Postural  dysfunction, Pain  Visit Diagnosis: Muscle weakness (generalized)  Difficulty in walking, not elsewhere classified  Pain in left hip  Pain in right hip     Problem List Patient Active Problem List   Diagnosis Date Noted  . Hip arthritis 07/28/2016  . Primary osteoarthritis of left hip 06/15/2016  . Nausea with vomiting 07/21/2013  . Unspecified constipation 07/21/2013  . Abdominal pain, epigastric 07/21/2013  . Arthritis of knee 07/15/2013  . Arthritis of left knee 04/15/2013    Lorrene Reid, PT 12/12/16 11:50 AM  [facility] [address], [address] [place], [place], [address] Phone: [phone]   Fax:  [phone]  Name: Leslie Ball MRN: [id number] Date of Birth: Jan 03, 1941

## 2016-12-14 ENCOUNTER — Ambulatory Visit: Payer: Medicaid Other

## 2016-12-14 DIAGNOSIS — M6281 Muscle weakness (generalized): Secondary | ICD-10-CM

## 2016-12-14 DIAGNOSIS — M25552 Pain in left hip: Secondary | ICD-10-CM

## 2016-12-14 DIAGNOSIS — M25551 Pain in right hip: Secondary | ICD-10-CM

## 2016-12-14 DIAGNOSIS — R262 Difficulty in walking, not elsewhere classified: Secondary | ICD-10-CM

## 2016-12-14 NOTE — Therapy (Addendum)
[facility] [address] [place], [place], [address] Phone: [phone]   Fax:  [phone]  Physical Therapy Treatment  Patient Details  Name: Leslie Ball MRN: [id number] Date of Birth: 1940/09/29 Referring Provider: Meredith Pel  Encounter Date: 12/14/2016      PT End of Session - 12/14/16 0930    Visit Number 9   Number of Visits 9   Date for PT Re-Evaluation 12/28/16   Authorization Type medicaid 10/23/2016-12/17/2016 8 visits approved plus the eval equals 9 total   Authorization - Visit Number 9   Authorization - Number of Visits 9   PT Start Time [phone]   PT Stop Time 0940  slow mobility   PT Time Calculation (min) 54 min   Activity Tolerance Patient limited by fatigue;Patient tolerated treatment well   Behavior During Therapy [facility] for tasks assessed/performed      Past Medical History:  Diagnosis Date  . Anemia   . Arthritis    "left knee; probably left shoulder & elbow" (07/15/2013)  . Asthma   . 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 Constipation    occasionally  . GERD (gastroesophageal reflux disease)    rare use of Pepto bismol  . Hypertension   . Neuromuscular disorder ([place])    right hand has "pins and needles" feeling at times  . RA (rheumatoid arthritis) ([place])     Past Surgical History:  Procedure Laterality Date  . CATARACT EXTRACTION W/ INTRAOCULAR LENS IMPLANT Bilateral   . EYE SURGERY Right   . JOINT REPLACEMENT    . REFRACTIVE SURGERY Bilateral   . RETINAL DETACHMENT SURGERY Right   . TOTAL HIP ARTHROPLASTY Left 07/28/2016   Procedure: LEFT ANTERIOR TOTAL HIP ARTHROPLASTY;  Surgeon: Meredith Pel, MD;  Location: [place];  Service: Orthopedics;  Laterality: Left;  . TOTAL KNEE ARTHROPLASTY Right 04/15/2013   Procedure: TOTAL KNEE ARTHROPLASTY;  Surgeon: Meredith Pel, MD;  Location: [place];   Service: Orthopedics;  Laterality: Right;  . TOTAL KNEE ARTHROPLASTY Left 07/15/2013  . TOTAL KNEE ARTHROPLASTY Left 07/15/2013   Procedure: TOTAL KNEE ARTHROPLASTY- left;  Surgeon: Meredith Pel, MD;  Location: [place];  Service: Orthopedics;  Laterality: Left;  . TOTAL KNEE ARTHROPLASTY Left 07/28/2016    There were no vitals filed for this visit.      Subjective Assessment - 12/14/16 0933    Subjective My Rt leg hurts.  Pt's daugher in law said they are still looking into self-pay option   Currently in Pain? Yes   Pain Score 5    Pain Location Leg   Pain Orientation Right            [facility] PT Assessment - 12/14/16 0001      Observation/Other Assessments   Focus on Therapeutic Outcomes (FOTO)  58% limitation                     [facility] Adult PT Treatment/Exercise - 12/14/16 0001      Ambulation/Gait   Ambulation/Gait Yes   Ambulation/Gait Assistance 6: Modified independent (Device/Increase time)   Ambulation Distance (Feet) 100 Feet   Assistive device Rolling walker   Gait Pattern Step-through pattern   Gait velocity slow     Knee/Hip Exercises: Aerobic   Nustep Level 1 x 7 minutes  PT present, no rest breaks  needed     Knee/Hip Exercises: Seated   Long Arc Quad Strengthening;Both;10 reps;3 sets   [facility] x30   Marching Limitations 2x10 bil.  Able to do on Rt without assistance   Hamstring Limitations shoulder flexion 2x10     Modalities   Modalities Electrical Stimulation;Cryotherapy     Cryotherapy   Number Minutes Cryotherapy 15 Minutes   Cryotherapy Location Lumbar Spine   Type of Cryotherapy Ice pack     Electrical Stimulation   Electrical Stimulation Location Rt hip and thigh   Electrical Stimulation Action IFC   Electrical Stimulation Parameters 15 minutes   Electrical Stimulation Goals Pain                  PT Short Term Goals - 12/12/16 1119      PT SHORT TERM GOAL #2   Title reports 25% less pain in bilateral  hip due to improved strength and improved gait   Time 4   Period Weeks   Status On-going           PT Long Term Goals - 12/12/16 1120      PT LONG TERM GOAL #1   Title FOTO score reduced to < or = to 59% limited   Baseline 73%   Time 4   Period Weeks   Status On-going     PT LONG TERM GOAL #3   Title Pt able to walk with LRAD, upright posture, and improved stride length due to increased LE strength.   Baseline improved posture, reduced step length.     Time 4   Period Weeks   Status On-going               Plan - 12/14/16 0902    Clinical Impression Statement Pt with slow mobility especially between activities in the clinic today.  Pt with improved sit to stand transition today.  Pt with continued Rt LE pain that limits mobility.  Pt with improved mobility of the Rt LE with exercise today.  Pt with slow mobility and fatigue with gait today.     Rehab Potential Good   Clinical Impairments Affecting Rehab Potential left TKR 2015, blind in right eye, RA, hypertension; s/p Left anterior total hip replacement on 07/28/3016   PT Frequency 2x / week   PT Duration 4 weeks   PT Next Visit Plan Hold chart until pt decides if she will return.  See if pt gets home TENs, show how to use if she does. family will consider self-pay as all approved visits have been used. Renewal if pt decides to continue.     Consulted and Agree with Plan of Care Patient      Patient will benefit from skilled therapeutic intervention in order to improve the following deficits and impairments:  Abnormal gait, Decreased activity tolerance, Decreased balance, Difficulty walking, Decreased strength, Postural dysfunction, Pain  Visit Diagnosis: Muscle weakness (generalized)  Difficulty in walking, not elsewhere classified  Pain in left hip  Pain in right hip     Problem List Patient Active Problem List   Diagnosis Date Noted  . Hip arthritis 07/28/2016  . Primary osteoarthritis of left hip  06/15/2016  . Nausea with vomiting 07/21/2013  . Unspecified constipation 07/21/2013  . Abdominal pain, epigastric 07/21/2013  . Arthritis of knee 07/15/2013  . Arthritis of left knee 04/15/2013     Sigurd Sos, PT 12/14/16 9:34 AM PHYSICAL THERAPY DISCHARGE SUMMARY  Visits from Start of Care: 9  Current functional level related to goals / functional outcomes: Pt didn't return to PT as she didn't have insurance approval for visits after visit 9.  See above for goal status.  Remaining deficits: See above for current deficits.   Education / Equipment: HEP Plan: Patient agrees to discharge.  Patient goals were partially met. Patient is being discharged due to financial reasons.  ?????        Sigurd Sos, PT 02/06/17 12:14 PM   [facility] [address], [place], [place], [address] Phone: [phone]   Fax:  [phone]  Name: Leslie Ball MRN: [id number] Date of Birth: 19-Jul-1940

## 2017-05-03 ENCOUNTER — Ambulatory Visit (INDEPENDENT_AMBULATORY_CARE_PROVIDER_SITE_OTHER): Payer: Medicaid Other | Admitting: Orthopedic Surgery

## 2017-06-06 ENCOUNTER — Ambulatory Visit (INDEPENDENT_AMBULATORY_CARE_PROVIDER_SITE_OTHER): Payer: Medicaid Other | Admitting: Orthopedic Surgery

## 2017-06-13 ENCOUNTER — Encounter (INDEPENDENT_AMBULATORY_CARE_PROVIDER_SITE_OTHER): Payer: Self-pay | Admitting: Orthopedic Surgery

## 2017-06-13 ENCOUNTER — Ambulatory Visit (INDEPENDENT_AMBULATORY_CARE_PROVIDER_SITE_OTHER): Payer: Medicaid Other

## 2017-06-13 ENCOUNTER — Ambulatory Visit (INDEPENDENT_AMBULATORY_CARE_PROVIDER_SITE_OTHER): Payer: Medicaid Other | Admitting: Orthopedic Surgery

## 2017-06-13 DIAGNOSIS — M1611 Unilateral primary osteoarthritis, right hip: Secondary | ICD-10-CM | POA: Diagnosis not present

## 2017-06-13 DIAGNOSIS — R634 Abnormal weight loss: Secondary | ICD-10-CM

## 2017-06-13 NOTE — Progress Notes (Signed)
Office Visit Note   Patient: Leslie Ball           Date of Birth: [birth year]           MRN: [id number] Visit Date: 06/13/2017 Requested by: Fleet Contras, MD [address] [place], [place] [address] PCP: Fleet Contras, MD  Subjective: Chief Complaint  Patient presents with  . Right Hip - Pain  . Left Hip - Pain  . Follow-up    HPI: Patient presents for follow-up of right hip.  Is doing well from her left total hip replacement.  Reports more right hip pain and popping.  Since I have seen her she has lost 25 pounds.  Her son says she is not eating well.  It is difficult for her to weight-bear.  Walker.  States her blood work was okay.              ROS: All systems reviewed are negative as they relate to the chief complaint within the history of present illness.  Patient denies  fevers or chills.   Assessment & Plan: Visit Diagnoses:  1. Unilateral primary osteoarthritis, right hip   2. Weight loss     Plan: Impression is right hip arthritis and the patient is done well with her other joint replacements.  At the time of this dictation her albumin and prealbumin are low.  Albumin is 3.1.  I would like for her albumin to be 3.5 at least now for considering elective joint replacement.  This is discussed with Sam her son.  We will check her labs again in 6 weeks after she has done some protein supplementation.  Could consider scheduling her for surgery at that time.  Follow-Up Instructions: Return in about 6 weeks (around 07/25/2017).   Orders:  Orders Placed This Encounter  Procedures  . XR HIP UNILAT W OR W/O PELVIS 2-3 VIEWS RIGHT  . Albumin  . Prealbumin   No orders of the defined types were placed in this encounter.     Procedures: No procedures performed   Clinical Data: No additional findings.  Objective: Vital Signs: There were no vitals taken for this visit.  Physical Exam:   Constitutional: Patient appears well-developed HEENT:  Head:  Normocephalic Eyes nontraumatic Neck: Normal range of motion Cardiovascular: Normal rate Pulmonary/chest: Effort normal Neurologic: Patient is alert Skin: Skin is warm Psychiatric: Patient has normal mood and affect    Ortho Exam: Orthopedic exam demonstrates no pain with left hip range of motion.  She does have pain with right hip range of motion.  She has good ankle dorsiflexion and plantar flexion strength on the right with palpable pedal pulses.  Knees move well.  Specialty Comments:  No specialty comments available.  Imaging: No results found.   PMFS History: Patient Active Problem List   Diagnosis Date Noted  . Hip arthritis 07/28/2016  . Primary osteoarthritis of left hip 06/15/2016  . Nausea with vomiting 07/21/2013  . Unspecified constipation 07/21/2013  . Abdominal pain, epigastric 07/21/2013  . Arthritis of knee 07/15/2013  . Arthritis of left knee 04/15/2013   Past Medical History:  Diagnosis Date  . Anemia   . Arthritis    "left knee; probably left shoulder & elbow" (07/15/2013)  . Asthma   . 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 Constipation    occasionally  . GERD (gastroesophageal reflux disease)  rare use of Pepto bismol  . Hypertension   . Neuromuscular disorder ([facility])    right hand has "pins and needles" feeling at times  . RA (rheumatoid arthritis) ([facility])     History reviewed. No pertinent family history.  Past Surgical History:  Procedure Laterality Date  . CATARACT EXTRACTION W/ INTRAOCULAR LENS IMPLANT Bilateral   . EYE SURGERY Right   . JOINT REPLACEMENT    . REFRACTIVE SURGERY Bilateral   . RETINAL DETACHMENT SURGERY Right   . TOTAL HIP ARTHROPLASTY Left 07/28/2016   Procedure: LEFT ANTERIOR TOTAL HIP ARTHROPLASTY;  Surgeon: Cammy Copa, MD;  [facility];  Service: Orthopedics;  Laterality: Left;  . TOTAL KNEE ARTHROPLASTY Right 04/15/2013    Procedure: TOTAL KNEE ARTHROPLASTY;  Surgeon: Cammy Copa, MD;  Location: [facility] OR;  Service: Orthopedics;  Laterality: Right;  . TOTAL KNEE ARTHROPLASTY Left 07/15/2013  . TOTAL KNEE ARTHROPLASTY Left 07/15/2013   Procedure: TOTAL KNEE ARTHROPLASTY- left;  Surgeon: Cammy Copa, MD;  Location: [facility] OR;  Service: Orthopedics;  Laterality: Left;  . TOTAL KNEE ARTHROPLASTY Left 07/28/2016   Social History   Occupational History  . Not on file  Tobacco Use  . Smoking status: Never Smoker  . Smokeless tobacco: Never Used  Substance and Sexual Activity  . Alcohol use: No  . Drug use: No  . Sexual activity: Not on file

## 2017-06-14 LAB — PREALBUMIN: Prealbumin: 11 mg/dL — ABNORMAL LOW (ref 17–34)

## 2017-06-14 LAB — ALBUMIN: Albumin: 3.1 g/dL — ABNORMAL LOW (ref 3.6–5.1)

## 2017-06-14 NOTE — Progress Notes (Signed)
Albumin slightly low at 3.1.  Needs to be over 3.5 before surgery.  Please have her consider eating better and also using things like boost supplement in order to get her protein level up.  Touch check it again in 1 month and then we can posterior for surgery

## 2017-06-15 ENCOUNTER — Telehealth (INDEPENDENT_AMBULATORY_CARE_PROVIDER_SITE_OTHER): Payer: Self-pay

## 2017-06-15 NOTE — Telephone Encounter (Signed)
Patient's son was returning your call concerning lab results.  Cb# is [phone].  Please advise.  Thank you

## 2017-06-18 NOTE — Telephone Encounter (Signed)
I called him back.

## 2017-07-25 ENCOUNTER — Ambulatory Visit (INDEPENDENT_AMBULATORY_CARE_PROVIDER_SITE_OTHER): Payer: Medicaid Other | Admitting: Orthopedic Surgery

## 2017-08-08 ENCOUNTER — Encounter (INDEPENDENT_AMBULATORY_CARE_PROVIDER_SITE_OTHER): Payer: Self-pay | Admitting: Orthopedic Surgery

## 2017-08-08 ENCOUNTER — Ambulatory Visit (INDEPENDENT_AMBULATORY_CARE_PROVIDER_SITE_OTHER): Payer: Medicaid Other | Admitting: Orthopedic Surgery

## 2017-08-08 DIAGNOSIS — M1611 Unilateral primary osteoarthritis, right hip: Secondary | ICD-10-CM

## 2017-08-08 NOTE — Progress Notes (Signed)
Office Visit Note   Patient: Leslie Ball           Date of Birth: 1940-07-12           MRN: [id number] Visit Date: 08/08/2017 Requested by: Fleet Contras, MD [address] [place], [place] [address] PCP: Fleet Contras, MD  Subjective: Chief Complaint  Patient presents with  . Right Hip - Pain, Follow-up    HPI: Patient presents for follow-up of right hip pain.  Blood work done at primary care provider for albumin and prealbumin not available.  In general though she has been eating better and doing supplements.  She is here with her son Doreatha Martin and a Nurse, learning disability.  She states that the pain is much worse.  She reports severe pain and is unable to stand on the left right leg due to the pain.  She has a known history of end-stage right hip arthritis.  She does live alone but gets help 3 hours a day.              ROS: All systems reviewed are negative as they relate to the chief complaint within the history of present illness.  Patient denies  fevers or chills.   Assessment & Plan: Visit Diagnoses:  1. Unilateral primary osteoarthritis, right hip     Plan: Impression is right hip pain.  Plan to schedule for right hip replacement.  Risk and benefits of gone over with them before.  I will call them with the results of that blood work.  Continue with daily supplements.  I think in general at this point this is more of a quality of life issue so even if the albumin is not exactly where we want it I think for quality of life in order to be able to get up and get around some she will proceed with hip replacement.  Also discussed at length with Sam today the likely need to go to skilled nursing for at least a week after surgery if she is going to be going back to her home.  Follow-Up Instructions: No Follow-up on file.   Orders:  No orders of the defined types were placed in this encounter.  No orders of the defined types were placed in this encounter.     Procedures: No procedures  performed   Clinical Data: No additional findings.  Objective: Vital Signs: There were no vitals taken for this visit.  Physical Exam:   Constitutional: Patient appears well-developed HEENT:  Head: Normocephalic Eyes:EOM are normal Neck: Normal range of motion Cardiovascular: Normal rate Pulmonary/chest: Effort normal Neurologic: Patient is alert Skin: Skin is warm Psychiatric: Patient has normal mood and affect    Ortho Exam: Orthopedic exam demonstrates pain with range of motion of the right hip.  There is no effusion in either knee.  She does have reasonable ankle dorsiflexion plantarflexion quad hamstring strength.  Definite pain with right hip range of motion and no pain with left hip range of motion  Specialty Comments:  No specialty comments available.  Imaging: No results found.   PMFS History: Patient Active Problem List   Diagnosis Date Noted  . Hip arthritis 07/28/2016  . Primary osteoarthritis of left hip 06/15/2016  . Nausea with vomiting 07/21/2013  . Unspecified constipation 07/21/2013  . Abdominal pain, epigastric 07/21/2013  . Arthritis of knee 07/15/2013  . Arthritis of left knee 04/15/2013   Past Medical History:  Diagnosis Date  . Anemia   . Arthritis    "left  knee; probably left shoulder & elbow" (07/15/2013)  . Asthma   . 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 Constipation    occasionally  . GERD (gastroesophageal reflux disease)    rare use of Pepto bismol  . Hypertension   . Neuromuscular disorder ([facility])    right hand has "pins and needles" feeling at times  . RA (rheumatoid arthritis) ([facility])     History reviewed. No pertinent family history.  Past Surgical History:  Procedure Laterality Date  . CATARACT EXTRACTION W/ INTRAOCULAR LENS IMPLANT Bilateral   . EYE SURGERY Right   . JOINT REPLACEMENT    . REFRACTIVE SURGERY Bilateral   . RETINAL DETACHMENT  SURGERY Right   . TOTAL HIP ARTHROPLASTY Left 07/28/2016   Procedure: LEFT ANTERIOR TOTAL HIP ARTHROPLASTY;  Surgeon: Cammy Copa, MD;  Location: [facility] OR;  Service: Orthopedics;  Laterality: Left;  . TOTAL KNEE ARTHROPLASTY Right 04/15/2013   Procedure: TOTAL KNEE ARTHROPLASTY;  Surgeon: Cammy Copa, MD;  Location: [facility] OR;  Service: Orthopedics;  Laterality: Right;  . TOTAL KNEE ARTHROPLASTY Left 07/15/2013  . TOTAL KNEE ARTHROPLASTY Left 07/15/2013   Procedure: TOTAL KNEE ARTHROPLASTY- left;  Surgeon: Cammy Copa, MD;  Location: [facility] OR;  Service: Orthopedics;  Laterality: Left;  . TOTAL KNEE ARTHROPLASTY Left 07/28/2016   Social History   Occupational History  . Not on file  Tobacco Use  . Smoking status: Never Smoker  . Smokeless tobacco: Never Used  Substance and Sexual Activity  . Alcohol use: No  . Drug use: No  . Sexual activity: Not on file

## 2017-08-17 ENCOUNTER — Other Ambulatory Visit (INDEPENDENT_AMBULATORY_CARE_PROVIDER_SITE_OTHER): Payer: Self-pay | Admitting: Orthopedic Surgery

## 2017-08-17 DIAGNOSIS — M1611 Unilateral primary osteoarthritis, right hip: Secondary | ICD-10-CM

## 2017-08-24 NOTE — Pre-Procedure Instructions (Addendum)
Leslie Ball Paolo  08/24/2017      [id number] - [place], [place] - [address] AT [facility] [address] & [facility] [address]  [place] [address] Phone: [phone] Fax: [phone]  [facility] - [place], [place] - [address] AT [facility] OF [facility] [address] [place] [address] Phone: [phone] Fax: [phone]    Your procedure is scheduled on 09/06/2017.  Report to [facility] Admitting at 0530 A.M.  Call this number if you have problems the morning of surgery:  [phone]   Remember:  Do not eat food or drink liquids after midnight.   Continue all medications as directed by your physician except follow these medication instructions before surgery below   Take these medicines the morning of surgery with A SIP OF WATER: Brinzolamide (Azopt) eye drop Fluticasoone-Salmeterol (Advair) Gabapentin (Neurtonin) Metoprolol succinate (Toprol XL) Oxycodone (Oxy IR) - if needed Tramadol (Ultram) - if needed  7 days prior to surgery STOP taking any Aspirin(unless otherwise instructed by your surgeon), Aleve, Naproxen, Ibuprofen, Motrin, Advil, Goody's, BC's, all herbal medications, fish oil, and all vitamins  Follow your doctors instructions regarding your Xarelto.  If no instructions were given by your doctor, then you will need to call the prescribing office office to get instructions.      Do not wear jewelry, make-up or nail polish.  Do not wear lotions, powders, or perfumes, or deodorant.  Do not shave 48 hours prior to surgery.    Do not bring valuables to the hospital.  [facility] is not responsible for any belongings or valuables.  Hearing aids, eyeglasses, contacts, dentures or bridgework may not be worn into surgery.  Leave your suitcase in the car.  After surgery it may be brought to your room.  For patients admitted to the hospital, discharge time will be determined by your treatment  team.  Patients discharged the day of surgery will not be allowed to drive home.   Name and phone number of your driver:    Special instructions:   [facility]- Preparing For Surgery  Before surgery, you can play an important role. Because skin is not sterile, your skin needs to be as free of germs as possible. You can reduce the number of germs on your skin by washing with CHG (chlorahexidine gluconate) Soap before surgery.  CHG is an antiseptic cleaner which kills germs and bonds with the skin to continue killing germs even after washing.  Please do not use if you have an allergy to CHG or antibacterial soaps. If your skin becomes reddened/irritated stop using the CHG.  Do not shave (including legs and underarms) for at least 48 hours prior to first CHG shower. It is OK to shave your face.  Please follow these instructions carefully.   1. Shower the NIGHT BEFORE SURGERY and the MORNING OF SURGERY with CHG.   2. If you chose to wash your hair, wash your hair first as usual with your normal shampoo.  3. After you shampoo, rinse your hair and body thoroughly to remove the shampoo.  4. Use CHG as you would any other liquid soap. You can apply CHG directly to the skin and wash gently with a scrungie or a clean washcloth.   5. Apply the CHG Soap to your body ONLY FROM THE NECK DOWN.  Do not use on open wounds or open sores. Avoid contact with your  eyes, ears, mouth and genitals (private parts). Wash Face and genitals (private parts)  with your normal soap.  6. Wash thoroughly, paying special attention to the area where your surgery will be performed.  7. Thoroughly rinse your body with warm water from the neck down.  8. DO NOT shower/wash with your normal soap after using and rinsing off the CHG Soap.  9. Pat yourself dry with a CLEAN TOWEL.  10. Wear CLEAN PAJAMAS to bed the night before surgery, wear comfortable clothes the morning of surgery  11. Place CLEAN SHEETS on your bed the  night of your first shower and DO NOT SLEEP WITH PETS.    Day of Surgery: Shower as stated above. Do not apply any deodorants/lotions. Please wear clean clothes to the hospital/surgery center.      Please read over the following fact sheets that you were given.

## 2017-08-27 ENCOUNTER — Encounter (HOSPITAL_COMMUNITY)
Admission: RE | Admit: 2017-08-27 | Discharge: 2017-08-27 | Disposition: A | Discharge status: Home / Self Care | Payer: Medicaid Other | Source: Ambulatory Visit | Attending: Orthopedic Surgery | Admitting: Orthopedic Surgery

## 2017-08-27 ENCOUNTER — Other Ambulatory Visit: Payer: Self-pay

## 2017-08-27 ENCOUNTER — Encounter (HOSPITAL_COMMUNITY): Payer: Self-pay

## 2017-08-27 DIAGNOSIS — Z01812 Encounter for preprocedural laboratory examination: Secondary | ICD-10-CM | POA: Diagnosis present

## 2017-08-27 DIAGNOSIS — I10 Essential (primary) hypertension: Secondary | ICD-10-CM | POA: Diagnosis not present

## 2017-08-27 DIAGNOSIS — Z01810 Encounter for preprocedural cardiovascular examination: Secondary | ICD-10-CM | POA: Diagnosis not present

## 2017-08-27 LAB — BASIC METABOLIC PANEL
ANION GAP: 13 (ref 5–15)
BUN: 20 mg/dL (ref 6–20)
CO2: 24 mmol/L (ref 22–32)
Calcium: 8.9 mg/dL (ref 8.9–10.3)
Chloride: 98 mmol/L — ABNORMAL LOW (ref 101–111)
Creatinine, Ser: 0.73 mg/dL (ref 0.44–1.00)
GFR calc Af Amer: >60 mL/min (ref >60)
GLUCOSE: 95 mg/dL (ref 65–99)
POTASSIUM: 3.8 mmol/L (ref 3.5–5.1)
Sodium: 135 mmol/L (ref 135–145)

## 2017-08-27 LAB — CBC
HEMATOCRIT: 33.6 % — AB (ref 36.0–46.0)
HEMOGLOBIN: 10.7 g/dL — AB (ref 12.0–15.0)
MCH: 24.9 pg — ABNORMAL LOW (ref 26.0–34.0)
MCHC: 31.8 g/dL (ref 30.0–36.0)
MCV: 78.3 fL (ref 78.0–100.0)
Platelets: 490 10*3/uL — ABNORMAL HIGH (ref 150–400)
RBC: 4.29 MIL/uL (ref 3.87–5.11)
RDW: 18.9 % — ABNORMAL HIGH (ref 11.5–15.5)
WBC: 5.8 10*3/uL (ref 4.0–10.5)

## 2017-08-27 LAB — URINALYSIS, ROUTINE W REFLEX MICROSCOPIC
BILIRUBIN URINE: NEGATIVE
Bacteria, UA: NONE SEEN
GLUCOSE, UA: NEGATIVE mg/dL
KETONES UR: NEGATIVE mg/dL
Nitrite: NEGATIVE
PH: 6 (ref 5.0–8.0)
Protein, ur: NEGATIVE mg/dL
Specific Gravity, Urine: 1.018 (ref 1.005–1.030)

## 2017-08-27 LAB — SURGICAL PCR SCREEN
MRSA, PCR: NEGATIVE
STAPHYLOCOCCUS AUREUS: NEGATIVE

## 2017-08-27 NOTE — Progress Notes (Signed)
PCP - Dr. Fleet Contras at [facility] Cardiologist - patient denies  Chest x-ray - n/a EKG - 08/27/2017 Stress Test - patient and patient's son deny ECHO - patient and patient's son deny Cardiac Cath - patient and patient's son deny  Sleep Study - patient and patient's son deny  Blood Thinner Instructions: patient states she no longer takes her Xarelto and has not for a long time, looks like Dr. August Saucer ordered it post-op Left hip with no refills  Anesthesia review: n/a  Patient denies shortness of breath, fever, cough and chest pain at PAT appointment   Patient verbalized understanding of instructions that were given to them at the PAT appointment. Patient was also instructed that they will need to review over the PAT instructions again at home before surgery.

## 2017-08-28 LAB — URINE CULTURE

## 2017-08-31 ENCOUNTER — Telehealth (INDEPENDENT_AMBULATORY_CARE_PROVIDER_SITE_OTHER): Payer: Self-pay | Admitting: Orthopedic Surgery

## 2017-08-31 NOTE — Telephone Encounter (Signed)
Patient's daughter in law Shawna Orleans) called needing discharge information for patient care. Shawna Orleans advised she just need to be prepared when patient leave the hospital. The number to contact Shawna Orleans is [phone]

## 2017-08-31 NOTE — Telephone Encounter (Signed)
Do you know where patient will be sent post operatively?

## 2017-09-04 NOTE — Telephone Encounter (Signed)
I spoke with [facility]. Advised that once Leslie Ball is admitted to the hospital she will have a case manager assigned to helping arrange her post op care.  Advised to let them know that they prefer SNF with rehab daily.  Also, offered to send demographic info to [facility] and [facility] to see if they will accept her Medicaid.  These are facilities that sometimes let you pre-register the patients.

## 2017-09-05 MED ORDER — SODIUM CHLORIDE 0.9 % IV SOLN
1000.0000 mg | INTRAVENOUS | Status: AC
  Administered 2017-09-06: 1000 mg via INTRAVENOUS
  Filled 2017-09-05: qty 1100
  Filled 2017-09-05: qty 10

## 2017-09-06 ENCOUNTER — Inpatient Hospital Stay (HOSPITAL_COMMUNITY): Payer: Medicaid Other

## 2017-09-06 ENCOUNTER — Inpatient Hospital Stay (HOSPITAL_COMMUNITY): Payer: Medicaid Other | Admitting: Certified Registered Nurse Anesthetist

## 2017-09-06 ENCOUNTER — Encounter (HOSPITAL_COMMUNITY): Payer: Self-pay | Admitting: *Deleted

## 2017-09-06 ENCOUNTER — Other Ambulatory Visit: Payer: Self-pay

## 2017-09-06 ENCOUNTER — Encounter (HOSPITAL_COMMUNITY)
Admission: RE | Disposition: A | Discharge status: Skilled Nursing Facility | Payer: Self-pay | Source: Ambulatory Visit | Attending: Orthopedic Surgery

## 2017-09-06 ENCOUNTER — Inpatient Hospital Stay (HOSPITAL_COMMUNITY)
Admission: RE | Admit: 2017-09-06 | Discharge: 2017-09-11 | DRG: 470 | Disposition: A | Discharge status: Skilled Nursing Facility | Payer: Medicaid Other | Source: Ambulatory Visit | Attending: Orthopedic Surgery | Admitting: Orthopedic Surgery

## 2017-09-06 DIAGNOSIS — M069 Rheumatoid arthritis, unspecified: Secondary | ICD-10-CM | POA: Diagnosis present

## 2017-09-06 DIAGNOSIS — K219 Gastro-esophageal reflux disease without esophagitis: Secondary | ICD-10-CM | POA: Diagnosis present

## 2017-09-06 DIAGNOSIS — M25551 Pain in right hip: Secondary | ICD-10-CM | POA: Diagnosis present

## 2017-09-06 DIAGNOSIS — M1611 Unilateral primary osteoarthritis, right hip: Secondary | ICD-10-CM

## 2017-09-06 DIAGNOSIS — Z961 Presence of intraocular lens: Secondary | ICD-10-CM | POA: Diagnosis present

## 2017-09-06 DIAGNOSIS — Z419 Encounter for procedure for purposes other than remedying health state, unspecified: Secondary | ICD-10-CM

## 2017-09-06 DIAGNOSIS — I10 Essential (primary) hypertension: Secondary | ICD-10-CM | POA: Diagnosis present

## 2017-09-06 DIAGNOSIS — G709 Myoneural disorder, unspecified: Secondary | ICD-10-CM | POA: Diagnosis present

## 2017-09-06 DIAGNOSIS — Z9841 Cataract extraction status, right eye: Secondary | ICD-10-CM | POA: Diagnosis not present

## 2017-09-06 DIAGNOSIS — Z9842 Cataract extraction status, left eye: Secondary | ICD-10-CM | POA: Diagnosis not present

## 2017-09-06 DIAGNOSIS — D62 Acute posthemorrhagic anemia: Secondary | ICD-10-CM | POA: Diagnosis not present

## 2017-09-06 DIAGNOSIS — H5461 Unqualified visual loss, right eye, normal vision left eye: Secondary | ICD-10-CM | POA: Diagnosis present

## 2017-09-06 DIAGNOSIS — Z888 Allergy status to other drugs, medicaments and biological substances status: Secondary | ICD-10-CM | POA: Diagnosis not present

## 2017-09-06 DIAGNOSIS — Z885 Allergy status to narcotic agent status: Secondary | ICD-10-CM

## 2017-09-06 DIAGNOSIS — Z96653 Presence of artificial knee joint, bilateral: Secondary | ICD-10-CM | POA: Diagnosis present

## 2017-09-06 DIAGNOSIS — Z96642 Presence of left artificial hip joint: Secondary | ICD-10-CM | POA: Diagnosis present

## 2017-09-06 DIAGNOSIS — M1610 Unilateral primary osteoarthritis, unspecified hip: Secondary | ICD-10-CM

## 2017-09-06 HISTORY — PX: TOTAL HIP ARTHROPLASTY: SHX124

## 2017-09-06 HISTORY — PX: APPLICATION OF WOUND VAC: SHX5189

## 2017-09-06 LAB — POCT I-STAT 4, (NA,K, GLUC, HGB,HCT)
Glucose, Bld: 148 mg/dL — ABNORMAL HIGH (ref 65–99)
HCT: 27 % — ABNORMAL LOW (ref 36.0–46.0)
Hemoglobin: 9.2 g/dL — ABNORMAL LOW (ref 12.0–15.0)
Potassium: 3.7 mmol/L (ref 3.5–5.1)
Sodium: 137 mmol/L (ref 135–145)

## 2017-09-06 SURGERY — ARTHROPLASTY, HIP, TOTAL, ANTERIOR APPROACH
Anesthesia: General | Site: Hip | Laterality: Right

## 2017-09-06 MED ORDER — HYDROCODONE-ACETAMINOPHEN 7.5-325 MG PO TABS
1.0000 | ORAL_TABLET | Freq: Once | ORAL | Status: AC | PRN
  Administered 2017-09-06: 1 via ORAL

## 2017-09-06 MED ORDER — FENTANYL CITRATE (PF) 250 MCG/5ML IJ SOLN
INTRAMUSCULAR | Status: AC
  Filled 2017-09-06: qty 5

## 2017-09-06 MED ORDER — HYDROMORPHONE HCL 2 MG/ML IJ SOLN: 0.5000 mg | INTRAMUSCULAR | Status: DC | PRN

## 2017-09-06 MED ORDER — HYDROMORPHONE HCL 2 MG/ML IJ SOLN
INTRAMUSCULAR | Status: AC
  Filled 2017-09-06: qty 1

## 2017-09-06 MED ORDER — DEXAMETHASONE SODIUM PHOSPHATE 10 MG/ML IJ SOLN
INTRAMUSCULAR | Status: AC
  Filled 2017-09-06: qty 1

## 2017-09-06 MED ORDER — FENTANYL CITRATE (PF) 250 MCG/5ML IJ SOLN
INTRAMUSCULAR | Status: DC | PRN
  Administered 2017-09-06 (×3): 25 ug via INTRAVENOUS
  Administered 2017-09-06 (×2): 50 ug via INTRAVENOUS
  Administered 2017-09-06: 75 ug via INTRAVENOUS
  Administered 2017-09-06: 25 ug via INTRAVENOUS
  Administered 2017-09-06: 100 ug via INTRAVENOUS

## 2017-09-06 MED ORDER — THEOPHYLLINE ER 300 MG PO TB12
150.0000 mg | ORAL_TABLET | Freq: Every day | ORAL | Status: DC
  Administered 2017-09-08 – 2017-09-10 (×3): 150 mg via ORAL
  Filled 2017-09-06 (×6): qty 1

## 2017-09-06 MED ORDER — GLYCOPYRROLATE 0.2 MG/ML IJ SOLN
INTRAMUSCULAR | Status: DC | PRN
  Administered 2017-09-06: 0.2 mg via INTRAVENOUS

## 2017-09-06 MED ORDER — 0.9 % SODIUM CHLORIDE (POUR BTL) OPTIME
TOPICAL | Status: DC | PRN
  Administered 2017-09-06 (×5): 1000 mL

## 2017-09-06 MED ORDER — SCOPOLAMINE 1 MG/3DAYS TD PT72
MEDICATED_PATCH | TRANSDERMAL | Status: AC
  Filled 2017-09-06: qty 1

## 2017-09-06 MED ORDER — FENTANYL CITRATE (PF) 100 MCG/2ML IJ SOLN
INTRAMUSCULAR | Status: AC
  Filled 2017-09-06: qty 2

## 2017-09-06 MED ORDER — PHENYLEPHRINE 40 MCG/ML (10ML) SYRINGE FOR IV PUSH (FOR BLOOD PRESSURE SUPPORT)
PREFILLED_SYRINGE | INTRAVENOUS | Status: AC
  Filled 2017-09-06: qty 10

## 2017-09-06 MED ORDER — TRANEXAMIC ACID 1000 MG/10ML IV SOLN
2000.0000 mg | Freq: Once | INTRAVENOUS | Status: DC
  Filled 2017-09-06: qty 20

## 2017-09-06 MED ORDER — GABAPENTIN 100 MG PO CAPS
100.0000 mg | ORAL_CAPSULE | Freq: Three times a day (TID) | ORAL | Status: DC
  Administered 2017-09-06 – 2017-09-11 (×15): 100 mg via ORAL
  Filled 2017-09-06 (×15): qty 1

## 2017-09-06 MED ORDER — ONDANSETRON HCL 4 MG/2ML IJ SOLN
INTRAMUSCULAR | Status: DC | PRN
  Administered 2017-09-06: 4 mg via INTRAVENOUS

## 2017-09-06 MED ORDER — PROPOFOL 10 MG/ML IV BOLUS
INTRAVENOUS | Status: AC
  Filled 2017-09-06: qty 20

## 2017-09-06 MED ORDER — ARTIFICIAL TEARS OPHTHALMIC OINT
TOPICAL_OINTMENT | OPHTHALMIC | Status: AC
  Filled 2017-09-06: qty 3.5

## 2017-09-06 MED ORDER — ONDANSETRON HCL 4 MG PO TABS
4.0000 mg | ORAL_TABLET | Freq: Four times a day (QID) | ORAL | Status: DC | PRN
  Administered 2017-09-06 – 2017-09-10 (×2): 4 mg via ORAL
  Filled 2017-09-06 (×2): qty 1

## 2017-09-06 MED ORDER — RIVAROXABAN 10 MG PO TABS
10.0000 mg | ORAL_TABLET | Freq: Every day | ORAL | Status: DC
  Administered 2017-09-07 – 2017-09-11 (×5): 10 mg via ORAL
  Filled 2017-09-06 (×6): qty 1

## 2017-09-06 MED ORDER — PHENOL 1.4 % MT LIQD: 1.0000 | OROMUCOSAL | Status: DC | PRN

## 2017-09-06 MED ORDER — PROPOFOL 10 MG/ML IV BOLUS
INTRAVENOUS | Status: DC | PRN
  Administered 2017-09-06 (×2): 30 mg via INTRAVENOUS
  Administered 2017-09-06: 10 mg via INTRAVENOUS
  Administered 2017-09-06: 30 mg via INTRAVENOUS
  Administered 2017-09-06: 70 mg via INTRAVENOUS
  Administered 2017-09-06: 10 mg via INTRAVENOUS
  Administered 2017-09-06: 20 mg via INTRAVENOUS

## 2017-09-06 MED ORDER — LACTATED RINGERS IV SOLN
INTRAVENOUS | Status: DC | PRN
  Administered 2017-09-06 (×3): via INTRAVENOUS

## 2017-09-06 MED ORDER — METHOCARBAMOL 1000 MG/10ML IJ SOLN
500.0000 mg | Freq: Four times a day (QID) | INTRAVENOUS | Status: DC | PRN
  Filled 2017-09-06: qty 5

## 2017-09-06 MED ORDER — LIDOCAINE 2% (20 MG/ML) 5 ML SYRINGE
INTRAMUSCULAR | Status: AC
  Filled 2017-09-06: qty 5

## 2017-09-06 MED ORDER — METHOCARBAMOL 500 MG PO TABS
ORAL_TABLET | ORAL | Status: AC
  Administered 2017-09-07: 500 mg via ORAL
  Filled 2017-09-06: qty 1

## 2017-09-06 MED ORDER — HYDROCODONE-ACETAMINOPHEN 5-325 MG PO TABS
2.0000 | ORAL_TABLET | Freq: Four times a day (QID) | ORAL | Status: DC | PRN
  Administered 2017-09-06 – 2017-09-10 (×6): 2 via ORAL
  Filled 2017-09-06 (×7): qty 2

## 2017-09-06 MED ORDER — TRANEXAMIC ACID 1000 MG/10ML IV SOLN
INTRAVENOUS | Status: DC | PRN
  Administered 2017-09-06: 2000 mg via TOPICAL

## 2017-09-06 MED ORDER — MOMETASONE FURO-FORMOTEROL FUM 200-5 MCG/ACT IN AERO
2.0000 | INHALATION_SPRAY | Freq: Two times a day (BID) | RESPIRATORY_TRACT | Status: DC
Start: 2017-09-06 — End: 2017-09-11
  Administered 2017-09-06 – 2017-09-11 (×10): 2 via RESPIRATORY_TRACT
  Filled 2017-09-06: qty 8.8

## 2017-09-06 MED ORDER — PROPOFOL 1000 MG/100ML IV EMUL
INTRAVENOUS | Status: AC
  Filled 2017-09-06: qty 100

## 2017-09-06 MED ORDER — LIDOCAINE HCL (CARDIAC) PF 100 MG/5ML IV SOSY
PREFILLED_SYRINGE | INTRAVENOUS | Status: DC | PRN
  Administered 2017-09-06: 60 mg via INTRATRACHEAL

## 2017-09-06 MED ORDER — PROPOFOL 500 MG/50ML IV EMUL
INTRAVENOUS | Status: DC | PRN
  Administered 2017-09-06: 15 ug/kg/min via INTRAVENOUS

## 2017-09-06 MED ORDER — ROCURONIUM BROMIDE 100 MG/10ML IV SOLN
INTRAVENOUS | Status: DC | PRN
  Administered 2017-09-06: 50 mg via INTRAVENOUS

## 2017-09-06 MED ORDER — PHENYLEPHRINE HCL 10 MG/ML IJ SOLN
INTRAMUSCULAR | Status: DC | PRN
  Administered 2017-09-06: 80 ug via INTRAVENOUS

## 2017-09-06 MED ORDER — LATANOPROST 0.005 % OP SOLN
1.0000 [drp] | Freq: Every day | OPHTHALMIC | Status: DC
Start: 2017-09-06 — End: 2017-09-11
  Administered 2017-09-06 – 2017-09-11 (×6): 1 [drp] via OPHTHALMIC
  Filled 2017-09-06: qty 2.5

## 2017-09-06 MED ORDER — MENTHOL 3 MG MT LOZG: 1.0000 | LOZENGE | OROMUCOSAL | Status: DC | PRN

## 2017-09-06 MED ORDER — METOPROLOL SUCCINATE ER 25 MG PO TB24
25.0000 mg | ORAL_TABLET | Freq: Every day | ORAL | Status: DC
  Administered 2017-09-07 – 2017-09-11 (×5): 25 mg via ORAL
  Filled 2017-09-06 (×5): qty 1

## 2017-09-06 MED ORDER — ACETAMINOPHEN 325 MG PO TABS
325.0000 mg | ORAL_TABLET | Freq: Four times a day (QID) | ORAL | Status: DC | PRN
  Administered 2017-09-10: 650 mg via ORAL
  Filled 2017-09-06: qty 2

## 2017-09-06 MED ORDER — ALBUTEROL SULFATE HFA 108 (90 BASE) MCG/ACT IN AERS
INHALATION_SPRAY | RESPIRATORY_TRACT | Status: AC
  Filled 2017-09-06: qty 6.7

## 2017-09-06 MED ORDER — DEXAMETHASONE SODIUM PHOSPHATE 10 MG/ML IJ SOLN
INTRAMUSCULAR | Status: DC | PRN
  Administered 2017-09-06: 10 mg via INTRAVENOUS

## 2017-09-06 MED ORDER — ONDANSETRON HCL 4 MG/2ML IJ SOLN: 4.0000 mg | Freq: Four times a day (QID) | INTRAMUSCULAR | Status: DC | PRN

## 2017-09-06 MED ORDER — SUGAMMADEX SODIUM 200 MG/2ML IV SOLN
INTRAVENOUS | Status: DC | PRN
  Administered 2017-09-06: 200 mg via INTRAVENOUS

## 2017-09-06 MED ORDER — CEFAZOLIN SODIUM-DEXTROSE 2-4 GM/100ML-% IV SOLN
2.0000 g | Freq: Four times a day (QID) | INTRAVENOUS | Status: AC
  Administered 2017-09-06 (×2): 2 g via INTRAVENOUS
  Filled 2017-09-06 (×2): qty 100

## 2017-09-06 MED ORDER — ROCURONIUM BROMIDE 10 MG/ML (PF) SYRINGE
PREFILLED_SYRINGE | INTRAVENOUS | Status: AC
  Filled 2017-09-06: qty 5

## 2017-09-06 MED ORDER — SUGAMMADEX SODIUM 200 MG/2ML IV SOLN
INTRAVENOUS | Status: AC
  Filled 2017-09-06: qty 2

## 2017-09-06 MED ORDER — ALBUTEROL SULFATE HFA 108 (90 BASE) MCG/ACT IN AERS
INHALATION_SPRAY | RESPIRATORY_TRACT | Status: DC | PRN
  Administered 2017-09-06 (×2): 2 via RESPIRATORY_TRACT

## 2017-09-06 MED ORDER — ALBUMIN HUMAN 5 % IV SOLN
INTRAVENOUS | Status: DC | PRN
  Administered 2017-09-06 (×2): via INTRAVENOUS

## 2017-09-06 MED ORDER — HYDROCHLOROTHIAZIDE 12.5 MG PO CAPS
12.5000 mg | ORAL_CAPSULE | Freq: Every day | ORAL | Status: DC
  Administered 2017-09-07 – 2017-09-11 (×4): 12.5 mg via ORAL
  Filled 2017-09-06 (×5): qty 1

## 2017-09-06 MED ORDER — DOCUSATE SODIUM 100 MG PO CAPS
100.0000 mg | ORAL_CAPSULE | Freq: Two times a day (BID) | ORAL | Status: DC
  Administered 2017-09-06 – 2017-09-11 (×9): 100 mg via ORAL
  Filled 2017-09-06 (×11): qty 1

## 2017-09-06 MED ORDER — METOCLOPRAMIDE HCL 5 MG PO TABS
5.0000 mg | ORAL_TABLET | Freq: Three times a day (TID) | ORAL | Status: DC | PRN
Start: 2017-09-06 — End: 2017-09-11

## 2017-09-06 MED ORDER — TRAMADOL HCL 50 MG PO TABS
50.0000 mg | ORAL_TABLET | Freq: Four times a day (QID) | ORAL | Status: DC
  Administered 2017-09-07 – 2017-09-11 (×19): 50 mg via ORAL
  Filled 2017-09-06 (×18): qty 1

## 2017-09-06 MED ORDER — LACTATED RINGERS IV SOLN
INTRAVENOUS | Status: AC
  Administered 2017-09-07: 01:00:00 via INTRAVENOUS

## 2017-09-06 MED ORDER — HYDROCODONE-ACETAMINOPHEN 7.5-325 MG PO TABS
ORAL_TABLET | ORAL | Status: AC
  Filled 2017-09-06: qty 1

## 2017-09-06 MED ORDER — DOCUSATE SODIUM 100 MG PO CAPS: 100.0000 mg | ORAL_CAPSULE | Freq: Two times a day (BID) | ORAL | Status: DC

## 2017-09-06 MED ORDER — PHENYLEPHRINE HCL 10 MG/ML IJ SOLN
INTRAVENOUS | Status: DC | PRN
  Administered 2017-09-06: 25 ug/min via INTRAVENOUS

## 2017-09-06 MED ORDER — METOCLOPRAMIDE HCL 5 MG/ML IJ SOLN
5.0000 mg | Freq: Three times a day (TID) | INTRAMUSCULAR | Status: DC | PRN
Start: 2017-09-06 — End: 2017-09-11

## 2017-09-06 MED ORDER — HYDROMORPHONE HCL 1 MG/ML IJ SOLN
0.5000 mg | INTRAMUSCULAR | Status: DC | PRN
  Administered 2017-09-06: 0.5 mg via INTRAVENOUS

## 2017-09-06 MED ORDER — POVIDONE-IODINE 7.5 % EX SOLN
Freq: Once | CUTANEOUS | Status: DC
  Filled 2017-09-06: qty 118

## 2017-09-06 MED ORDER — METHOCARBAMOL 500 MG PO TABS
500.0000 mg | ORAL_TABLET | Freq: Four times a day (QID) | ORAL | Status: DC | PRN
  Administered 2017-09-06 – 2017-09-11 (×6): 500 mg via ORAL
  Filled 2017-09-06 (×6): qty 1

## 2017-09-06 MED ORDER — ONDANSETRON HCL 4 MG/2ML IJ SOLN
INTRAMUSCULAR | Status: AC
  Filled 2017-09-06: qty 2

## 2017-09-06 MED ORDER — FENTANYL CITRATE (PF) 100 MCG/2ML IJ SOLN
25.0000 ug | INTRAMUSCULAR | Status: DC | PRN
  Administered 2017-09-06 (×2): 50 ug via INTRAVENOUS

## 2017-09-06 MED ORDER — CEFAZOLIN SODIUM-DEXTROSE 2-4 GM/100ML-% IV SOLN
2.0000 g | INTRAVENOUS | Status: AC
  Administered 2017-09-06: 2 g via INTRAVENOUS
  Filled 2017-09-06: qty 100

## 2017-09-06 MED ORDER — ARTIFICIAL TEARS OPHTHALMIC OINT
TOPICAL_OINTMENT | OPHTHALMIC | Status: DC | PRN
  Administered 2017-09-06: 1 via OPHTHALMIC

## 2017-09-06 MED ORDER — METOPROLOL TARTRATE 5 MG/5ML IV SOLN
INTRAVENOUS | Status: AC
  Filled 2017-09-06: qty 5

## 2017-09-06 MED ORDER — BRINZOLAMIDE 1 % OP SUSP
1.0000 [drp] | Freq: Three times a day (TID) | OPHTHALMIC | Status: DC
  Administered 2017-09-06 – 2017-09-11 (×15): 1 [drp] via OPHTHALMIC
  Filled 2017-09-06: qty 10

## 2017-09-06 SURGICAL SUPPLY — 52 items
BAG BILE T-TUBES STRL (MISCELLANEOUS) IMPLANT
BAG DECANTER FOR FLEXI CONT (MISCELLANEOUS) ×3 IMPLANT
BLADE CLIPPER SURG (BLADE) ×3 IMPLANT
BLADE SAW SGTL 18X1.27X75 (BLADE) ×2 IMPLANT
BLADE SAW SGTL 18X1.27X75MM (BLADE) ×1
CANISTER WOUND CARE 500ML ATS (WOUND CARE) ×3 IMPLANT
CAPT HIP TOTAL 2 ×3 IMPLANT
CELLS DAT CNTRL 66122 CELL SVR (MISCELLANEOUS) ×1 IMPLANT
CLOSURE WOUND 1/2 X4 (GAUZE/BANDAGES/DRESSINGS) ×1
COVER PERINEAL POST (MISCELLANEOUS) ×3 IMPLANT
COVER SURGICAL LIGHT HANDLE (MISCELLANEOUS) ×3 IMPLANT
DRAPE C-ARM 42X72 X-RAY (DRAPES) ×3 IMPLANT
DRAPE STERI IOBAN 125X83 (DRAPES) ×3 IMPLANT
DRAPE U-SHAPE 47X51 STRL (DRAPES) ×9 IMPLANT
DRSG AQUACEL AG ADV 3.5X10 (GAUZE/BANDAGES/DRESSINGS) ×3 IMPLANT
DURAPREP 26ML APPLICATOR (WOUND CARE) ×3 IMPLANT
ELECT BLADE 4.0 EZ CLEAN MEGAD (MISCELLANEOUS) ×3
ELECT REM PT RETURN 9FT ADLT (ELECTROSURGICAL) ×3
ELECTRODE BLDE 4.0 EZ CLN MEGD (MISCELLANEOUS) ×1 IMPLANT
ELECTRODE REM PT RTRN 9FT ADLT (ELECTROSURGICAL) ×1 IMPLANT
GLOVE BIOGEL PI IND STRL 8 (GLOVE) ×1 IMPLANT
GLOVE BIOGEL PI INDICATOR 8 (GLOVE) ×2
GLOVE BIOGEL PI ORTHO PRO SZ8 (GLOVE) ×4
GLOVE PI ORTHO PRO STRL SZ8 (GLOVE) ×2 IMPLANT
GOWN STRL REUS W/ TWL LRG LVL3 (GOWN DISPOSABLE) ×3 IMPLANT
GOWN STRL REUS W/TWL LRG LVL3 (GOWN DISPOSABLE) ×6
HANDPIECE INTERPULSE COAX TIP (DISPOSABLE) ×2
HOOD PEEL AWAY FLYTE STAYCOOL (MISCELLANEOUS) ×15 IMPLANT
KIT BASIN OR (CUSTOM PROCEDURE TRAY) ×3 IMPLANT
KIT PREVENA INCISION MGT20CM45 (CANNISTER) ×3 IMPLANT
KIT TURNOVER KIT B (KITS) ×3 IMPLANT
NEEDLE HYPO 22GX1.5 SAFETY (NEEDLE) IMPLANT
NS IRRIG 1000ML POUR BTL (IV SOLUTION) ×3 IMPLANT
PACK TOTAL JOINT (CUSTOM PROCEDURE TRAY) ×3 IMPLANT
PAD ARMBOARD 7.5X6 YLW CONV (MISCELLANEOUS) ×3 IMPLANT
RTRCTR WOUND ALEXIS 18CM MED (MISCELLANEOUS) ×3
SET HNDPC FAN SPRY TIP SCT (DISPOSABLE) ×1 IMPLANT
STRIP CLOSURE SKIN 1/2X4 (GAUZE/BANDAGES/DRESSINGS) ×2 IMPLANT
SUT ETHIBOND NAB CT1 #1 30IN (SUTURE) ×6 IMPLANT
SUT MNCRL AB 3-0 PS2 18 (SUTURE) ×3 IMPLANT
SUT VIC AB 0 CT1 27 (SUTURE) ×12
SUT VIC AB 0 CT1 27XBRD ANBCTR (SUTURE) ×6 IMPLANT
SUT VIC AB 1 CT1 27 (SUTURE) ×8
SUT VIC AB 1 CT1 27XBRD ANBCTR (SUTURE) ×4 IMPLANT
SUT VIC AB 2-0 CT1 27 (SUTURE) ×6
SUT VIC AB 2-0 CT1 TAPERPNT 27 (SUTURE) ×3 IMPLANT
SYR 30ML LL (SYRINGE) IMPLANT
TOWEL OR 17X24 6PK STRL BLUE (TOWEL DISPOSABLE) ×3 IMPLANT
TOWEL OR 17X26 10 PK STRL BLUE (TOWEL DISPOSABLE) ×3 IMPLANT
TRAY FOLEY W/METER SILVER 16FR (SET/KITS/TRAYS/PACK) ×3 IMPLANT
WATER STERILE IRR 1000ML POUR (IV SOLUTION) ×3 IMPLANT
YANKAUER SUCT BULB TIP NO VENT (SUCTIONS) ×3 IMPLANT

## 2017-09-06 NOTE — Transfer of Care (Signed)
Immediate Anesthesia Transfer of Care Note  Patient: Leslie Ball  Procedure(s) Performed: RIGHT TOTAL HIP ARTHROPLASTY ANTERIOR APPROACH (Right Hip) APPLICATION OF WOUND VAC (Right Hip)  Patient Location: PACU  Anesthesia Type:General  Level of Consciousness: awake, drowsy and pateint uncooperative  Airway & Oxygen Therapy: Patient Spontanous Breathing and Patient connected to nasal cannula oxygen  Post-op Assessment: Report given to RN, Post -op Vital signs reviewed and stable and Patient moving all extremities X 4  Post vital signs: Reviewed and stable  Last Vitals:  Vitals Value Taken Time  BP 123/62 09/06/2017 11:54 AM  Temp    Pulse 74 09/06/2017 11:56 AM  Resp 15 09/06/2017 11:56 AM  SpO2 100 % 09/06/2017 11:56 AM  Vitals shown include unvalidated device data.  Last Pain:  Vitals:   09/06/17 0646  TempSrc:   PainSc: 9       Patients Stated Pain Goal: 3 (09/06/17 9242)  Complications: No apparent anesthesia complications

## 2017-09-06 NOTE — H&P (Signed)
TOTAL HIP ADMISSION H&P  Patient is admitted for right total hip arthroplasty.  Subjective:  Chief Complaint: right hip pain  HPI: Leslie Ball, 77 y.o. female, has a history of pain and functional disability in the right hip(s) due to arthritis and patient has failed non-surgical conservative treatments for greater than 12 weeks to include NSAID's and/or analgesics, use of assistive devices and activity modification.  Onset of symptoms was gradual starting 5 years ago with rapidlly worsening course since that time.The patient noted no past surgery on the right hip(s).  Patient currently rates pain in the right hip at 10 out of 10 with activity. Patient has night pain, worsening of pain with activity and weight bearing, trendelenberg gait, pain that interfers with activities of daily living, pain with passive range of motion and crepitus. Patient has evidence of subchondral cysts, subchondral sclerosis, periarticular osteophytes and joint space narrowing by imaging studies. This condition presents safety issues increasing the risk of falls. This patient has had good result with left total hip.  There is no current active infection.  Patient Active Problem List   Diagnosis Date Noted  . Hip arthritis 07/28/2016  . Primary osteoarthritis of left hip 06/15/2016  . Nausea with vomiting 07/21/2013  . Unspecified constipation 07/21/2013  . Abdominal pain, epigastric 07/21/2013  . Arthritis of knee 07/15/2013  . Arthritis of left knee 04/15/2013   Past Medical History:  Diagnosis Date  . Anemia   . Arthritis    "left knee; probably left shoulder & elbow" (07/15/2013)  . Asthma   . 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 Constipation    occasionally  . GERD (gastroesophageal reflux disease)    rare use of Pepto bismol  . Hypertension   . Neuromuscular disorder (HCC)    right hand has "pins and needles" feeling at  times  . RA (rheumatoid arthritis) (HCC)     Past Surgical History:  Procedure Laterality Date  . CATARACT EXTRACTION W/ INTRAOCULAR LENS IMPLANT Bilateral   . EYE SURGERY Right   . JOINT REPLACEMENT    . REFRACTIVE SURGERY Bilateral   . RETINAL DETACHMENT SURGERY Right   . TOTAL HIP ARTHROPLASTY Left 07/28/2016   Procedure: LEFT ANTERIOR TOTAL HIP ARTHROPLASTY;  Surgeon: Cammy Copa, MD;  Location: [facility] OR;  Service: Orthopedics;  Laterality: Left;  . TOTAL KNEE ARTHROPLASTY Right 04/15/2013   Procedure: TOTAL KNEE ARTHROPLASTY;  Surgeon: Cammy Copa, MD;  Location: [facility] OR;  Service: Orthopedics;  Laterality: Right;  . TOTAL KNEE ARTHROPLASTY Left 07/15/2013  . TOTAL KNEE ARTHROPLASTY Left 07/15/2013   Procedure: TOTAL KNEE ARTHROPLASTY- left;  Surgeon: Cammy Copa, MD;  Location: [facility] OR;  Service: Orthopedics;  Laterality: Left;  . TOTAL KNEE ARTHROPLASTY Left 07/28/2016    Current Facility-Administered Medications  Medication Dose Route Frequency Provider Last Rate Last Dose  . ceFAZolin (ANCEF) IVPB 2g/100 mL premix  2 g Intravenous On Call to OR Cammy Copa, MD      . povidone-iodine (BETADINE) 7.5 % scrub   Topical Once Cammy Copa, MD      . tranexamic acid (CYKLOKAPRON) 1,000 mg in sodium chloride 0.9 % 100 mL IVPB  1,000 mg Intravenous To OR August Saucer Corrie Mckusick, MD       Allergies  Allergen Reactions  . Oxycodone Itching  . Chlorhexidine Itching    REACTION: redness  Social History   Tobacco Use  . Smoking status: Never Smoker  . Smokeless tobacco: Never Used  Substance Use Topics  . Alcohol use: No    History reviewed. No pertinent family history.   Review of Systems  Musculoskeletal: Positive for joint pain.  All other systems reviewed and are negative.   Objective:  Physical Exam  Constitutional: She appears well-developed.  HENT:  Head: Normocephalic.  Eyes: Pupils are equal, round, and reactive to light.  Neck: Normal  range of motion.  Cardiovascular: Normal rate.  Respiratory: Effort normal.  Neurological: She is alert.  Skin: Skin is warm.  Psychiatric: She has a normal mood and affect.   Skin ok - df pf ok pain with rom hip - leg lengths ok Vital signs in last 24 hours: Temp:  [98.3 F (36.8 C)] 98.3 F (36.8 C) (04/18 4481) Pulse Rate:  [73] 73 (04/18 0608) Resp:  [18] 18 (04/18 0608) BP: (117)/(64) 117/64 (04/18 0608) SpO2:  [97 %] 97 % (04/18 0608) Weight:  [152 lb (68.9 kg)] 152 lb (68.9 kg) (04/18 0608)  Labs:   Estimated body mass index is 27.8 kg/m as calculated from the following:   Height as of this encounter: 5\' 2"  (1.575 m).   Weight as of this encounter: 152 lb (68.9 kg).   Imaging Review Plain radiographs demonstrate severe degenerative joint disease of the right hip(s). The bone quality appears to be fair for age and reported activity level.    Preoperative templating of the joint replacement has been completed, documented, and submitted to the Operating Room personnel in order to optimize intra-operative equipment management.   Anticipated LOS equal to or greater than 2 midnights due to - Age 16 and older with one or more of the following:  - Obesity  - Expected need for hospital services (PT, OT, Nursing) required for safe  discharge  - Anticipated need for postoperative skilled nursing care or inpatient rehab  - Active co-morbidities: significant overall deconditioning OR   - Unanticipated findings during/Post Surgery: Slow post-op progression: GI, pain control, mobility  - Patient is a high risk of re-admission due to: Barriers to post-acute care (logistical, no family support in home)     Assessment/Plan:  End stage arthritis, right hip(s)  The patient history, physical examination, clinical judgement of the provider and imaging studies are consistent with end stage degenerative joint disease of the right hip(s) and total hip arthroplasty is deemed  medically necessary. The treatment options including medical management, injection therapy, arthroscopy and arthroplasty were discussed at length. The risks and benefits of total hip arthroplasty were presented and reviewed. The risks due to aseptic loosening, infection, stiffness, dislocation/subluxation,  thromboembolic complications and other imponderables were discussed.  The patient acknowledged the explanation, agreed to proceed with the plan and consent was signed. Patient is being admitted for inpatient treatment for surgery, pain control, PT, OT, prophylactic antibiotics, VTE prophylaxis, progressive ambulation and ADL's and discharge planning.The patient is planning to be discharged to skilled nursing facility

## 2017-09-06 NOTE — Anesthesia Procedure Notes (Signed)
Procedure Name: Intubation Date/Time: 09/06/2017 8:04 AM Performed by: Oleta Mouse, MD Pre-anesthesia Checklist: Patient identified, Emergency Drugs available, Suction available and Patient being monitored Patient Re-evaluated:Patient Re-evaluated prior to induction Oxygen Delivery Method: Circle system utilized Preoxygenation: Pre-oxygenation with 100% oxygen Induction Type: IV induction Ventilation: Mask ventilation without difficulty Laryngoscope Size: Mac, Sabra Heck, 3 and 2 (DLx3,  MAC3, grade IV view, Miller 2, grade Iv, glidescope with grade 1 view.  ) Grade View: Grade IV Tube type: Oral Tube size: 7.0 mm Number of attempts: 3 Airway Equipment and Method: Stylet and Video-laryngoscopy Placement Confirmation: ETT inserted through vocal cords under direct vision,  positive ETCO2 and breath sounds checked- equal and bilateral Secured at: 21 cm Tube secured with: Tape Dental Injury: Teeth and Oropharynx as per pre-operative assessment  Difficulty Due To: Difficulty was unanticipated and Difficult Airway- due to reduced neck mobility

## 2017-09-06 NOTE — Anesthesia Preprocedure Evaluation (Signed)
Anesthesia Evaluation  Patient identified by MRN, date of birth, ID band Patient awake    Reviewed: Allergy & Precautions, NPO status , Patient's Chart, lab work & pertinent test results, reviewed documented beta blocker date and time   History of Anesthesia Complications Negative for: history of anesthetic complications  Airway Mallampati: II  TM Distance: >3 FB Neck ROM: Full    Dental  (+) Dental Advisory Given   Pulmonary asthma ,    breath sounds clear to auscultation       Cardiovascular hypertension, Pt. on medications and Pt. on home beta blockers  Rhythm:Regular Rate:Normal     Neuro/Psych negative neurological ROS     GI/Hepatic Neg liver ROS, GERD  Controlled,  Endo/Other  negative endocrine ROS  Renal/GU negative Renal ROS     Musculoskeletal  (+) Arthritis ,   Abdominal   Peds  Hematology  (+) anemia ,   Anesthesia Other Findings   Reproductive/Obstetrics                             Anesthesia Physical Anesthesia Plan  ASA: II  Anesthesia Plan: General   Post-op Pain Management:    Induction: Intravenous  PONV Risk Score and Plan: 3 and Ondansetron and Dexamethasone  Airway Management Planned: Oral ETT  Additional Equipment: None  Intra-op Plan:   Post-operative Plan: Extubation in OR  Informed Consent: I have reviewed the patients History and Physical, chart, labs and discussed the procedure including the risks, benefits and alternatives for the proposed anesthesia with the patient or authorized representative who has indicated his/her understanding and acceptance.   Dental advisory given  Plan Discussed with: Surgeon and CRNA  Anesthesia Plan Comments:         Anesthesia Quick Evaluation

## 2017-09-06 NOTE — Brief Op Note (Signed)
09/06/2017  11:42 AM  PATIENT:  Leslie Ball  77 y.o. female  PRE-OPERATIVE DIAGNOSIS:  Right Hip Osteoarthritis  POST-OPERATIVE DIAGNOSIS:  Right Hip Osteoarthritis  PROCEDURE:  Procedure(s): RIGHT TOTAL HIP ARTHROPLASTY ANTERIOR APPROACH APPLICATION OF WOUND VAC  SURGEON:  Surgeon(s): August Saucer, Corrie Mckusick, MD  ASSISTANT: Patrick Jupiter rnfa  ANESTHESIA:   general  EBL: 450 ml    Total I/O In: 2500 [I.V.:2000; IV Piggyback:500] Out: 800 [Urine:350; Blood:450]  BLOOD ADMINISTERED: none  DRAINS: incisional wound vac   LOCAL MEDICATIONS USED:  none  SPECIMEN:  No Specimen  COUNTS:  YES  TOURNIQUET:  * No tourniquets in log *  DICTATION: .Other Dictation: Dictation Number [id number]  PLAN OF CARE: Admit to inpatient   PATIENT DISPOSITION:  PACU - hemodynamically stable

## 2017-09-06 NOTE — Plan of Care (Signed)

## 2017-09-07 ENCOUNTER — Encounter (HOSPITAL_COMMUNITY): Payer: Self-pay | Admitting: Orthopedic Surgery

## 2017-09-07 LAB — PREPARE RBC (CROSSMATCH)

## 2017-09-07 LAB — CBC
HEMATOCRIT: 26.1 % — AB (ref 36.0–46.0)
HEMOGLOBIN: 7.9 g/dL — AB (ref 12.0–15.0)
MCH: 23.5 pg — AB (ref 26.0–34.0)
MCHC: 30.3 g/dL (ref 30.0–36.0)
MCV: 77.7 fL — AB (ref 78.0–100.0)
Platelets: 332 10*3/uL (ref 150–400)
RBC: 3.36 MIL/uL — ABNORMAL LOW (ref 3.87–5.11)
RDW: 18.7 % — ABNORMAL HIGH (ref 11.5–15.5)
WBC: 7.6 10*3/uL (ref 4.0–10.5)

## 2017-09-07 MED ORDER — ZOLPIDEM TARTRATE 5 MG PO TABS
5.0000 mg | ORAL_TABLET | Freq: Every evening | ORAL | Status: DC | PRN
  Administered 2017-09-07: 5 mg via ORAL
  Filled 2017-09-07: qty 1

## 2017-09-07 MED ORDER — SODIUM CHLORIDE 0.9 % IV SOLN
Freq: Once | INTRAVENOUS | Status: AC
  Administered 2017-09-07: 13:00:00 via INTRAVENOUS

## 2017-09-07 MED ORDER — MORPHINE SULFATE (PF) 4 MG/ML IV SOLN
4.0000 mg | INTRAVENOUS | Status: DC | PRN
  Administered 2017-09-07 – 2017-09-08 (×4): 4 mg via INTRAVENOUS
  Filled 2017-09-07 (×4): qty 1

## 2017-09-07 NOTE — Anesthesia Postprocedure Evaluation (Signed)
Anesthesia Post Note  Patient: Leslie Ball  Procedure(s) Performed: RIGHT TOTAL HIP ARTHROPLASTY ANTERIOR APPROACH (Right Hip) APPLICATION OF WOUND VAC (Right Hip)     Patient location during evaluation: PACU Anesthesia Type: General Level of consciousness: awake and alert Pain management: pain level controlled Vital Signs Assessment: post-procedure vital signs reviewed and stable Respiratory status: spontaneous breathing, nonlabored ventilation, respiratory function stable and patient connected to nasal cannula oxygen Cardiovascular status: blood pressure returned to baseline and stable Postop Assessment: no apparent nausea or vomiting Anesthetic complications: no    Last Vitals:  Vitals:   09/07/17 1204 09/07/17 1522  BP:  99/60  Pulse:  71  Resp:  16  Temp:  36.8 C  SpO2: 98% 94%    Last Pain:  Vitals:   09/07/17 1522  TempSrc: Oral  PainSc:                  [name]

## 2017-09-07 NOTE — Plan of Care (Signed)
  Problem: Education: Goal: Knowledge of General Education information will improve Outcome: Progressing Goal: Knowledge of General Education information will improve Outcome: Progressing   Problem: Health Behavior/Discharge Planning: Goal: Ability to manage health-related needs will improve Outcome: Progressing   Problem: Clinical Measurements: Goal: Ability to maintain clinical measurements within normal limits will improve Outcome: Progressing Goal: Will remain free from infection Outcome: Progressing Goal: Diagnostic test results will improve Outcome: Progressing Goal: Respiratory complications will improve Outcome: Progressing Goal: Cardiovascular complication will be avoided Outcome: Progressing   Problem: Activity: Goal: Risk for activity intolerance will decrease Outcome: Progressing   Problem: Nutrition: Goal: Adequate nutrition will be maintained Outcome: Progressing   Problem: Coping: Goal: Level of anxiety will decrease Outcome: Progressing   Problem: Elimination: Goal: Will not experience complications related to bowel motility Outcome: Progressing Goal: Will not experience complications related to urinary retention Outcome: Progressing   Problem: Pain Managment: Goal: General experience of comfort will improve Outcome: Progressing   Problem: Safety: Goal: Ability to remain free from injury will improve Outcome: Progressing   Problem: Skin Integrity: Goal: Risk for impaired skin integrity will decrease Outcome: Progressing

## 2017-09-07 NOTE — Progress Notes (Signed)
PT Cancellation Note  Patient Details Name: Leslie Ball MRN: [id number] DOB: [birth year]   Cancelled Treatment:    Reason Eval/Treat Not Completed: Patient declined, no reason specified. Pt declined PM treatment stating she had just got back into bed and was too fatigued to participate in therapy. Plan to see tomorrow to progress HEP and gait.   Kallie Locks, PTA Pager [phone] Acute Rehab  Sheral Apley 09/07/2017, 2:19 PM

## 2017-09-07 NOTE — Op Note (Signed)
NAME:  Leslie Ball, Leslie Ball NO.:  [id number]  MEDICAL RECORD NO.:  [id number]  LOCATION:                                 FACILITY:  PHYSICIAN:  Burnard Bunting, M.D.         DATE OF BIRTH:  DATE OF PROCEDURE:  09/06/2017 DATE OF DISCHARGE:                              OPERATIVE REPORT   PREOPERATIVE DIAGNOSIS:  Right hip arthritis.  POSTOPERATIVE DIAGNOSIS:  Right hip arthritis.  PROCEDURE:  Right total hip replacement using DePuy components, Corail stem, size 12, 32-mm ceramic ball with 48-mm cup, +4 liner, Pinnacle cup was utilized, all components press-fit.  SURGEON:  Burnard Bunting, M.D.  ASSISTANT:  Patrick Jupiter, RNFA.  INDICATIONS:  The patient is a 77 year old patient with end-stage right hip arthritis.  She presents for operative management after explanation of risks and benefits.  PROCEDURE IN DETAIL:  The patient was brought to the operating room where general endotracheal anesthesia was induced.  Preoperative antibiotics were administered.  Time-out was called.  The patient was placed on the Hana bed.  Right hip area was prescrubbed with alcohol and Betadine, allowed to air dry, prepped with DuraPrep solution and draped in a sterile manner.  Leslie Ball was used to cover the operative field. Incision was made after calling time-out from about 2 cm distal and inferior to the anterior superior iliac crest.  Skin and subcutaneous tissue were sharply divided.  Fascia lata was encountered.  Sensory nerves were mobilized anteriorly.  Fascia overlying the tensor fascia lata was elevated and mobilized anteriorly.  Two retractors were placed on the anterior-inferior and posterior-superior aspect of the femoral neck.  Capsulotomy was made and tagged with a #1 Vicryl suture.  At this time, the neck cut was made under fluoroscopic guidance.  Head was removed.  Reaming was then performed after excising the labrum circumferentially from the hip.  At this time, after  reaming was done under fluoroscopy and about 45 degrees of abduction and 20 degrees of anteversion, the cup was placed with two screws placed in standard fashion with good purchase achieved.  Nice fit on the cup was achieved fluoroscopically and by feel.  +4 liner was placed with good seating of the liner.  Attention was then directed toward the femur.  Femoral hook was placed.  Leg was taken into external rotation and adduction.  The conjoined tendon was released from the trochanteric region.  This allowed better exposure.  Mueller retractor and trochanteric retractor were placed.  The femur was then reamed up to a size 12 broach, which was placed.  This gave good fit and it was reduced with a +1 32-mm trial ball.  The reduction looked good.  Leg lengths looked good.  The patient had good stability with internal rotation as well as with external rotation of 60 degrees and 60 degrees of hip extension.  Trial components were removed.  Thorough irrigation performed.  True components placed with good stability, good press-fit achieved. Thorough irrigation performed.  Tranexamic acid sponge allowed to sit in that incision for 3 minutes.  Capsule was then closed using #1 Vicryl suture followed by #1 Vicryl suture to close the  fascia lata, followed by 0 Vicryl suture, 2-0 Vicryl suture and 3-0 Monocryl.  The patient tolerated the procedure well without immediate complications.  Incisional wound VAC was placed because of the patient's relatively high risk of potential wound compromise with slightly less than ideal albumin.  The patient was transferred to the recovery room in stable condition.     Burnard Bunting, M.D.   ______________________________ [name], M.D.    GSD/MEDQ  D:  09/06/2017  T:  09/06/2017  Job:  [id number]

## 2017-09-07 NOTE — Discharge Instructions (Signed)

## 2017-09-07 NOTE — Evaluation (Signed)
Physical Therapy Evaluation Patient Details Name: Leslie Ball MRN: [id number] DOB: [birth year] Today's Date: 09/07/2017   History of Present Illness  77 y.o. female s/p R THA 09/06/17. PMH includes: R eye blindness, RA, HTN, R hand neuromuscular disorder, B TKA  Clinical Impression  Patient is s/p above surgery resulting in functional limitations due to the deficits listed below (see PT Problem List). PTA, pt reports living alone independent with mobility. Upon eval pt presents with post op pain and weakness. Currently mod A for all mobility and unable to progress to ambulation due to pain. Transferred to bedside chair.  Patient will benefit from skilled PT to increase their independence and safety with mobility to allow discharge to the venue listed below.       Follow Up Recommendations SNF;Supervision/Assistance - 24 hour    Equipment Recommendations  (TBD in next venue)    Recommendations for Other Services OT consult     Precautions / Restrictions Precautions Precautions: Fall Restrictions Weight Bearing Restrictions: Yes RLE Weight Bearing: Weight bearing as tolerated      Mobility  Bed Mobility Overal bed mobility: Needs Assistance Bed Mobility: Supine to Sit     Supine to sit: Mod assist     General bed mobility comments: Mod A to assist surgical leg over EOB and support trunk into sitting  Transfers Overall transfer level: Needs assistance Equipment used: Rolling walker (2 wheeled) Transfers: Sit to/from [employer] Sit to Stand: Mod assist Stand pivot transfers: Mod assist       General transfer comment: Mod A to power up and stabilize, providing cues for hand placement and sequecning to pivot into chair.   Ambulation/Gait             General Gait Details: unable due to pain  Stairs            Wheelchair Mobility    Modified Rankin (Stroke Patients Only)       Balance Overall balance assessment: Needs assistance    Sitting balance-Leahy Scale: Good       Standing balance-Leahy Scale: Poor                               Pertinent Vitals/Pain Pain Assessment: Faces Faces Pain Scale: Hurts whole lot Pain Location: R Hip Pain Descriptors / Indicators: Discomfort;Grimacing;Aching Pain Intervention(s): Limited activity within patient's tolerance;Monitored during session;Premedicated before session;Repositioned    Home Living Family/patient expects to be discharged to:: Skilled nursing facility                      Prior Function Level of Independence: (unclear, patient reports living alone independent )               Hand Dominance   Dominant Hand: Right    Extremity/Trunk Assessment   Upper Extremity Assessment Upper Extremity Assessment: Defer to OT evaluation;Generalized weakness    Lower Extremity Assessment Lower Extremity Assessment: Generalized weakness;RLE deficits/detail       Communication   Communication: No difficulties  Cognition Arousal/Alertness: Awake/alert Behavior During Therapy: WFL for tasks assessed/performed Overall Cognitive Status: Within Functional Limits for tasks assessed                                        General Comments      Exercises  Assessment/Plan    PT Assessment Patient needs continued PT services  PT Problem List Decreased range of motion;Decreased strength;Decreased activity tolerance;Decreased balance;Decreased mobility;Pain       PT Treatment Interventions DME instruction;Gait training;Stair training;Functional mobility training;Therapeutic activities;Therapeutic exercise;Balance training    PT Goals (Current goals can be found in the Care Plan section)  Acute Rehab PT Goals Patient Stated Goal: open to rehab PT Goal Formulation: With patient Time For Goal Achievement: 09/14/17 Potential to Achieve Goals: Good    Frequency 7X/week   Barriers to discharge Decreased caregiver  support(lives alone)      Co-evaluation               AM-PAC PT "6 Clicks" Daily Activity  Outcome Measure Difficulty turning over in bed (including adjusting bedclothes, sheets and blankets)?: Unable Difficulty moving from lying on back to sitting on the side of the bed? : Unable Difficulty sitting down on and standing up from a chair with arms (e.g., wheelchair, bedside commode, etc,.)?: Unable Help needed moving to and from a bed to chair (including a wheelchair)?: A Lot Help needed walking in hospital room?: A Lot Help needed climbing 3-5 steps with a railing? : Total 6 Click Score: 8    End of Session Equipment Utilized During Treatment: Gait belt Activity Tolerance: Patient limited by pain Patient left: with call bell/phone within reach;in chair Nurse Communication: Mobility status PT Visit Diagnosis: Unsteadiness on feet (R26.81);Pain Pain - Right/Left: Right Pain - part of body: Hip    Time: 2956-2130 PT Time Calculation (min) (ACUTE ONLY): 32 min   Charges:   PT Evaluation $PT Eval Low Complexity: 1 Low PT Treatments $Therapeutic Activity: 8-22 mins   PT G Codes:        Etta Grandchild, PT, DPT Acute Rehab Services Pager: [phone]    Etta Grandchild 09/07/2017, 2:57 PM

## 2017-09-07 NOTE — Progress Notes (Signed)
Subjective: Pt stable - sitting in chair   Objective: Vital signs in last 24 hours: Temp:  [97 F (36.1 C)-98.2 F (36.8 C)] 97.6 F (36.4 C) (04/19 0500) Pulse Rate:  [58-78] 75 (04/19 0500) Resp:  [10-17] 16 (04/18 2023) BP: (100-133)/(56-96) 107/71 (04/19 0500) SpO2:  [95 %-100 %] 98 % (04/19 1204)  Intake/Output from previous day: 04/18 0701 - 04/19 0700 In: 2500 [I.V.:2000; IV Piggyback:500] Out: 2750 [Urine:2300; Blood:450] Intake/Output this shift: No intake/output data recorded.  Exam:  Dorsiflexion/Plantar flexion intact  Labs: Recent Labs    09/06/17 1029 09/07/17 0634  HGB 9.2* 7.9*   Recent Labs    09/06/17 1029 09/07/17 0634  WBC  --  7.6  RBC  --  3.36*  HCT 27.0* 26.1*  PLT  --  332   Recent Labs    09/06/17 1029  NA 137  K 3.7  GLUCOSE 148*   No results for input(s): LABPT, INR in the last 72 hours.  Assessment/Plan: Plan transfuse 2 u prbc today - dc to snf mon   G Dorene Grebe 09/07/2017, 12:12 PM

## 2017-09-08 LAB — TYPE AND SCREEN
ABO/RH(D): O POS
ANTIBODY SCREEN: NEGATIVE
UNIT DIVISION: 0
UNIT DIVISION: 0

## 2017-09-08 LAB — BPAM RBC
Blood Product Expiration Date: 201904252359
Blood Product Expiration Date: 201905082359
ISSUE DATE / TIME: 201904191514
ISSUE DATE / TIME: 201904191831
Unit Type and Rh: 5100
Unit Type and Rh: 5100

## 2017-09-08 NOTE — Plan of Care (Signed)
  Problem: Nutrition: Goal: Adequate nutrition will be maintained Outcome: Progressing   Problem: Elimination: Goal: Will not experience complications related to bowel motility Outcome: Progressing   Problem: Pain Managment: Goal: General experience of comfort will improve Outcome: Progressing   Problem: Safety: Goal: Ability to remain free from injury will improve Outcome: Progressing   

## 2017-09-08 NOTE — Progress Notes (Signed)
Subjective: 2 Days Post-Op Procedure(s) (LRB): RIGHT TOTAL HIP ARTHROPLASTY ANTERIOR APPROACH (Right) APPLICATION OF WOUND VAC (Right) Patient reports pain as moderate.  Transfusion yesterday secondary to acute blood loss anemia.    Objective: Vital signs in last 24 hours: Temp:  [97.3 F (36.3 C)-98.9 F (37.2 C)] 98.9 F (37.2 C) (04/20 0700) Pulse Rate:  [66-77] 76 (04/20 0700) Resp:  [16] 16 (04/19 2059) BP: (97-118)/(52-64) 118/63 (04/20 0700) SpO2:  [93 %-98 %] 94 % (04/20 0700)  Intake/Output from previous day: 04/19 0701 - 04/20 0700 In: 636.7 [I.V.:100; Blood:536.7] Out: -  Intake/Output this shift: No intake/output data recorded.  Recent Labs    09/06/17 1029 09/07/17 0634  HGB 9.2* 7.9*   Recent Labs    09/06/17 1029 09/07/17 0634  WBC  --  7.6  RBC  --  3.36*  HCT 27.0* 26.1*  PLT  --  332   Recent Labs    09/06/17 1029  NA 137  K 3.7  GLUCOSE 148*   No results for input(s): LABPT, INR in the last 72 hours.  Sensation intact distally Intact pulses distally Dorsiflexion/Plantar flexion intact Incision: dressing C/D/I  Incisional VAC in place  Assessment/Plan: 2 Days Post-Op Procedure(s) (LRB): RIGHT TOTAL HIP ARTHROPLASTY ANTERIOR APPROACH (Right) APPLICATION OF WOUND VAC (Right) Up with therapy Discharge to SNF most likely Monday pending progress with therapy.    Kathryne Hitch 09/08/2017, 7:24 AM

## 2017-09-08 NOTE — Progress Notes (Signed)
Physical Therapy Treatment Patient Details Name: Leslie Ball MRN: [id number] DOB: [birth year] Today's Date: 09/08/2017    History of Present Illness 77 y.o. female s/p R THA 09/06/17. PMH includes: R eye blindness, RA, HTN, R hand neuromuscular disorder, B TKA    PT Comments    Patient progressing slowly with therapy. Son present and helpful with translation today. Able to ambulate short distance in room today for first time, still fearful despite encouragement. Pt with urinary incontinence this visit. Reviewed therex and dosage with pt and son. SNF recs still appropriate at this time.    Follow Up Recommendations  SNF;Supervision/Assistance - 24 hour     Equipment Recommendations  (TBD in next venue)    Recommendations for Other Services OT consult     Precautions / Restrictions Precautions Precautions: Fall Restrictions Weight Bearing Restrictions: Yes RLE Weight Bearing: Weight bearing as tolerated    Mobility  Bed Mobility Overal bed mobility: Needs Assistance Bed Mobility: Supine to Sit     Supine to sit: Mod assist     General bed mobility comments: Mod A to assist surgical leg over EOB and support trunk into sitting  Transfers Overall transfer level: Needs assistance Equipment used: Rolling walker (2 wheeled) Transfers: Sit to/from [employer] Sit to Stand: Mod assist Stand pivot transfers: Mod assist       General transfer comment: Mod A to power up and stabilize, providing cues for hand placement and sequecning to pivot into chair.   Ambulation/Gait Ambulation/Gait assistance: Mod assist Ambulation Distance (Feet): 6 Feet Assistive device: Rolling walker (2 wheeled) Gait Pattern/deviations: Step-to pattern Gait velocity: deecreased   General Gait Details: ambulating short distances due to pain. mod A for stability.    Stairs             Wheelchair Mobility    Modified Rankin (Stroke Patients Only)       Balance  Overall balance assessment: Needs assistance   Sitting balance-Leahy Scale: Good       Standing balance-Leahy Scale: Poor                              Cognition Arousal/Alertness: Awake/alert Behavior During Therapy: WFL for tasks assessed/performed Overall Cognitive Status: Within Functional Limits for tasks assessed                                        Exercises Total Joint Exercises Ankle Circles/Pumps: 20 reps Quad Sets: 10 reps Heel Slides: 10 reps Hip ABduction/ADduction: 10 reps Straight Leg Raises: 10 reps(contralateral side) Long Arc Quad: 10 reps    General Comments        Pertinent Vitals/Pain Pain Assessment: Faces Faces Pain Scale: Hurts even more Pain Location: R Hip Pain Descriptors / Indicators: Discomfort;Grimacing;Aching Pain Intervention(s): Limited activity within patient's tolerance;Premedicated before session;Monitored during session;Repositioned    Home Living                      Prior Function            PT Goals (current goals can now be found in the care plan section) Acute Rehab PT Goals Patient Stated Goal: walk again  PT Goal Formulation: With patient Time For Goal Achievement: 09/14/17 Potential to Achieve Goals: Good Progress towards PT goals: Progressing toward goals    Frequency  7X/week      PT Plan Current plan remains appropriate    Co-evaluation              AM-PAC PT "6 Clicks" Daily Activity  Outcome Measure  Difficulty turning over in bed (including adjusting bedclothes, sheets and blankets)?: Unable Difficulty moving from lying on back to sitting on the side of the bed? : Unable Difficulty sitting down on and standing up from a chair with arms (e.g., wheelchair, bedside commode, etc,.)?: Unable Help needed moving to and from a bed to chair (including a wheelchair)?: A Lot Help needed walking in hospital room?: A Lot Help needed climbing 3-5 steps with a  railing? : Total 6 Click Score: 8    End of Session Equipment Utilized During Treatment: Gait belt Activity Tolerance: Patient limited by pain Patient left: with call bell/phone within reach;in chair Nurse Communication: Mobility status PT Visit Diagnosis: Unsteadiness on feet (R26.81);Pain Pain - Right/Left: Right Pain - part of body: Hip     Time: 1326-1400 PT Time Calculation (min) (ACUTE ONLY): 34 min  Charges:  $Gait Training: 8-22 mins $Therapeutic Exercise: 8-22 mins                    G Codes:      Etta Grandchild, PT, DPT Acute Rehab Services Pager: [phone]     Etta Grandchild 09/08/2017, 2:22 PM

## 2017-09-09 MED ORDER — BISACODYL 10 MG RE SUPP: 10.0000 mg | Freq: Every day | RECTAL | Status: DC | PRN

## 2017-09-09 NOTE — Progress Notes (Signed)
Subjective: 3 Days Post-Op Procedure(s) (LRB): RIGHT TOTAL HIP ARTHROPLASTY ANTERIOR APPROACH (Right) APPLICATION OF WOUND VAC (Right) Patient reports pain as moderate.  No acute changes.  Vitals stable.  She is concerned about not having a BM yet, but not uncomfortable.  Objective: Vital signs in last 24 hours: Temp:  [97.4 F (36.3 C)-98.4 F (36.9 C)] 97.8 F (36.6 C) (04/21 0437) Pulse Rate:  [68-74] 70 (04/21 0437) Resp:  [15-18] 18 (04/20 1537) BP: (102-131)/(57-80) 131/80 (04/21 0437) SpO2:  [94 %-98 %] 98 % (04/21 0437)  Intake/Output from previous day: 04/20 0701 - 04/21 0700 In: 480 [P.O.:480] Out: 800 [Urine:800] Intake/Output this shift: No intake/output data recorded.  Recent Labs    09/06/17 1029 09/07/17 0634  HGB 9.2* 7.9*   Recent Labs    09/06/17 1029 09/07/17 0634  WBC  --  7.6  RBC  --  3.36*  HCT 27.0* 26.1*  PLT  --  332   Recent Labs    09/06/17 1029  NA 137  K 3.7  GLUCOSE 148*   No results for input(s): LABPT, INR in the last 72 hours.  Sensation intact distally Intact pulses distally Dorsiflexion/Plantar flexion intact Incision: incisional VAC in place   Assessment/Plan: 3 Days Post-Op Procedure(s) (LRB): RIGHT TOTAL HIP ARTHROPLASTY ANTERIOR APPROACH (Right) APPLICATION OF WOUND VAC (Right) Up with therapy Discharge to SNF first of the week.    Kathryne Hitch 09/09/2017, 7:59 AM

## 2017-09-09 NOTE — Plan of Care (Signed)
  Problem: Clinical Measurements: Goal: Will remain free from infection Outcome: Progressing   Problem: Nutrition: Goal: Adequate nutrition will be maintained Outcome: Progressing   Problem: Elimination: Goal: Will not experience complications related to bowel motility Outcome: Progressing   Problem: Pain Managment: Goal: General experience of comfort will improve Outcome: Progressing   Problem: Safety: Goal: Ability to remain free from injury will improve Outcome: Progressing   Problem: Skin Integrity: Goal: Risk for impaired skin integrity will decrease Outcome: Progressing   

## 2017-09-10 ENCOUNTER — Telehealth (INDEPENDENT_AMBULATORY_CARE_PROVIDER_SITE_OTHER): Payer: Self-pay | Admitting: Orthopedic Surgery

## 2017-09-10 LAB — HEMOGLOBIN AND HEMATOCRIT, BLOOD
HEMATOCRIT: 33 % — AB (ref 36.0–46.0)
HEMOGLOBIN: 10.2 g/dL — AB (ref 12.0–15.0)

## 2017-09-10 MED ORDER — LATANOPROST 0.005 % OP SOLN: 1.0000 [drp] | Freq: Every day | OPHTHALMIC | 12 refills | Status: AC

## 2017-09-10 MED ORDER — HYDROCODONE-ACETAMINOPHEN 5-325 MG PO TABS: 2.0000 | ORAL_TABLET | Freq: Four times a day (QID) | ORAL | 0 refills | Status: DC | PRN

## 2017-09-10 MED ORDER — ACETAMINOPHEN 325 MG PO TABS: 325.0000 mg | ORAL_TABLET | Freq: Four times a day (QID) | ORAL | 0 refills | Status: DC | PRN

## 2017-09-10 MED ORDER — ZOLPIDEM TARTRATE 5 MG PO TABS: 5.0000 mg | ORAL_TABLET | Freq: Every evening | ORAL | 0 refills | Status: DC | PRN

## 2017-09-10 MED ORDER — RIVAROXABAN 10 MG PO TABS: 10.0000 mg | ORAL_TABLET | Freq: Every day | ORAL | 0 refills | Status: DC

## 2017-09-10 MED ORDER — METHOCARBAMOL 500 MG PO TABS: 500.0000 mg | ORAL_TABLET | Freq: Three times a day (TID) | ORAL | 0 refills | Status: DC | PRN

## 2017-09-10 NOTE — Telephone Encounter (Signed)
Please review and advise. Thanks.  

## 2017-09-10 NOTE — Social Work (Addendum)
CSW f/u on SNF offers as non received yet and patient expected to dc today.  CSW discussed status of placement with son Sam on the phone and patient at bedsdie.  CSW f/u for SNF placement.  Keene Breath, LCSW Clinical Social Worker [phone]

## 2017-09-10 NOTE — NC FL2 (Signed)
[place] MEDICAID FL2 LEVEL OF CARE SCREENING TOOL     IDENTIFICATION  Patient Name: Leslie Ball Birthdate: [birth year] Sex: female Admission Date (Current Location): 09/06/2017  [facility] and [place] Number:  Producer, television/film/video and Address:  [facility] [address] [place], [place] [address]      Provider Number: [id number]  Attending Physician Name and Address:  Cammy Copa, MD  Relative Name and Phone Number:  Dulce Sellar, son, [phone]    Current Level of Care: Hospital Recommended Level of Care: Skilled Nursing Facility Prior Approval Number:    Date Approved/Denied:   PASRR Number: [id number]  Discharge Plan: SNF    Current Diagnoses: Patient Active Problem List   Diagnosis Date Noted  . Hip arthritis 07/28/2016  . Primary osteoarthritis of left hip 06/15/2016  . Nausea with vomiting 07/21/2013  . Unspecified constipation 07/21/2013  . Abdominal pain, epigastric 07/21/2013  . Arthritis of knee 07/15/2013  . Arthritis of left knee 04/15/2013    Orientation RESPIRATION BLADDER Height & Weight     Self, Time, Situation, Place  Normal Incontinent Weight: 152 lb (68.9 kg) Height:  5\' 2"  (157.5 cm)  BEHAVIORAL SYMPTOMS/MOOD NEUROLOGICAL BOWEL NUTRITION STATUS      Continent Diet(See DC Summary)  AMBULATORY STATUS COMMUNICATION OF NEEDS Skin   Extensive Assist Verbally Wound Vac, Surgical wounds                       Personal Care Assistance Level of Assistance  Bathing, Feeding, Dressing Bathing Assistance: Maximum assistance Feeding assistance: Independent Dressing Assistance: Maximum assistance     Functional Limitations Info  Sight, Hearing, Speech Sight Info: Adequate Hearing Info: Adequate Speech Info: Adequate    SPECIAL CARE FACTORS FREQUENCY  PT (By licensed PT), OT (By licensed OT)     PT Frequency: 7x week OT Frequency: 7x week            Contractures      Additional Factors  Info  Code Status, Allergies Code Status Info: Full Code Allergies Info: OXYCODONE, CHLORHEXIDINE            Current Medications (09/10/2017):  This is the current hospital active medication list Current Facility-Administered Medications  Medication Dose Route Frequency Provider Last Rate Last Dose  . acetaminophen (TYLENOL) tablet 325-650 mg  325-650 mg Oral Q6H PRN 09/12/2017, MD      . bisacodyl (DULCOLAX) suppository 10 mg  10 mg Rectal Daily PRN Cammy Copa, MD      . brinzolamide (AZOPT) 1 % ophthalmic suspension 1 drop  1 drop Left Eye TID Kathryne Hitch, MD   1 drop at 09/10/17 [phone]  . docusate sodium (COLACE) capsule 100 mg  100 mg Oral BID 2671, MD   100 mg at 09/09/17 2231  . gabapentin (NEURONTIN) capsule 100 mg  100 mg Oral TID 2232, MD   100 mg at 09/10/17 0831  . hydrochlorothiazide (MICROZIDE) capsule 12.5 mg  12.5 mg Oral Daily 09/12/17, MD   12.5 mg at 09/09/17 0815  . HYDROcodone-acetaminophen (NORCO/VICODIN) 5-325 MG per tablet 2 tablet  2 tablet Oral Q6H PRN 09/11/17, MD   2 tablet at 09/10/17 0831  . latanoprost (XALATAN) 0.005 % ophthalmic solution 1 drop  1 drop Left Eye QHS 09/12/17 August Saucer, MD   1 drop at 09/09/17 2231  . menthol-cetylpyridinium (CEPACOL) lozenge 3 mg  1  lozenge Oral PRN Cammy Copa, MD       Or  . phenol [facility]) mouth spray 1 spray  1 spray Mouth/Throat PRN Cammy Copa, MD      . methocarbamol (ROBAXIN) tablet 500 mg  500 mg Oral Q6H PRN Cammy Copa, MD   500 mg at 09/10/17 2376   Or  . methocarbamol (ROBAXIN) 500 mg in dextrose 5 % 50 mL IVPB  500 mg Intravenous Q6H PRN Cammy Copa, MD      . metoCLOPramide (REGLAN) tablet 5-10 mg  5-10 mg Oral Q8H PRN Cammy Copa, MD       Or  . metoCLOPramide (REGLAN) injection 5-10 mg  5-10 mg Intravenous Q8H PRN Cammy Copa, MD      . metoprolol succinate (TOPROL-XL) 24 hr  tablet 25 mg  25 mg Oral Daily Cammy Copa, MD   25 mg at 09/10/17 0831  . mometasone-formoterol (DULERA) 200-5 MCG/ACT inhaler 2 puff  2 puff Inhalation BID Cammy Copa, MD   2 puff at 09/10/17 [phone]  . morphine 4 MG/ML injection 4 mg  4 mg Intravenous Q3H PRN Cammy Copa, MD   4 mg at 09/08/17 0552  . ondansetron (ZOFRAN) tablet 4 mg  4 mg Oral Q6H PRN Cammy Copa, MD   4 mg at 09/06/17 1944   Or  . ondansetron [facility]) injection 4 mg  4 mg Intravenous Q6H PRN Cammy Copa, MD      . rivaroxaban Carlena Hurl) tablet 10 mg  10 mg Oral Q breakfast Cammy Copa, MD   10 mg at 09/10/17 5176  . theophylline (THEODUR) 12 hr tablet 150 mg  150 mg Oral QHS Cammy Copa, MD   150 mg at 09/09/17 2231  . traMADol (ULTRAM) tablet 50 mg  50 mg Oral Q6H Cammy Copa, MD   50 mg at 09/10/17 0650  . zolpidem (AMBIEN) tablet 5 mg  5 mg Oral QHS PRN Cammy Copa, MD   5 mg at 09/07/17 2105     Discharge Medications: Please see discharge summary for a list of discharge medications.  Relevant Imaging Results:  Relevant Lab Results:   Additional Information SS#: [id number]  Tresa Moore, LCSW

## 2017-09-10 NOTE — Progress Notes (Signed)
Subjective: Pt stable - pain improving   Objective: Vital signs in last 24 hours: Temp:  [98 F (36.7 C)-98.6 F (37 C)] 98.6 F (37 C) (04/21 2044) Pulse Rate:  [69-77] 70 (04/22 0825) Resp:  [18] 18 (04/22 0825) BP: (96-140)/(56-72) 140/66 (04/22 0825) SpO2:  [96 %-99 %] 99 % (04/22 0825)  Intake/Output from previous day: 04/21 0701 - 04/22 0700 In: 480 [P.O.:480] Out: 400 [Urine:400] Intake/Output this shift: No intake/output data recorded.  Exam:  Sensation intact distally Intact pulses distally  Labs: No results for input(s): HGB in the last 72 hours. No results for input(s): WBC, RBC, HCT, PLT in the last 72 hours. No results for input(s): NA, K, CL, CO2, BUN, CREATININE, GLUCOSE, CALCIUM in the last 72 hours. No results for input(s): LABPT, INR in the last 72 hours.  Assessment/Plan: Plan dc to snf tomorrow   Burnard Bunting 09/10/2017, 9:41 AM

## 2017-09-10 NOTE — Telephone Encounter (Signed)
Patients daughter in law called on behalf of patient stating that the social worker is having a hard time finding a rehab facility that takes her insurance, she doesn't want her to be released from the hospital without have a facility to go to afterwards. She would like a call back as soon as possible. CB # [phone]

## 2017-09-10 NOTE — Clinical Social Work Note (Signed)
Clinical Social Work Assessment  Patient Details  Name: Leslie Ball MRN: [id number] Date of Birth: [birth year]  Date of referral:  09/08/17               Reason for consult:  Facility Placement                Permission sought to share information with:  Family Supports Permission granted to share information::  Yes, Verbal Permission Granted  Name::     sam  Agency::     Relationship::  son  Contact Information:  [phone]  Housing/Transportation Living arrangements for the past 2 months:  Single Family Home Source of Information:  Adult Children Patient Interpreter Needed:  None Criminal Activity/Legal Involvement Pertinent to Current Situation/Hospitalization:  No - Comment as needed Significant Relationships:  Adult Children Lives with:  Self Do you feel safe going back to the place where you live?  No Need for family participation in patient care:  No (Coment)  Care giving concerns:   CSW spoke with pt's son via telephone. No cargiver concerns noted at this time.   Social Worker assessment / plan:  CSW spoke with pt's son. Pt's son states he has been looking into facilities and would provide the CSW with two they would prefer at this time --[facility] and [facility]. CSW explained the process of obtaining a medicaid bed and the challenges--pt's son understanding. Pt's son willing to look into other facilities.  Employment status:    Insurance information:    PT Recommendations:  Skilled Nursing Facility Information / Referral to community resources:  Skilled Nursing Facility  Patient/Family's Response to care:  Pt's son verbalized understanding of CSW role and expressed appreciation for support. Pt's son denies any concern regarding pt care at this time.   Patient/Family's Understanding of and Emotional Response to Diagnosis, Current Treatment, and Prognosis:  Pt's son understanding and realistic regarding pt's physical limitations. Pt's son understands the  need for pt to go to SNF at d/c- pt's son agreeable. Pt's son denies any concern regarding treatment plan at this time. CSW will continue to provide support and facilitate d/c needs.   Emotional Assessment Appearance:  Appears stated age Attitude/Demeanor/Rapport:  (Pt was appropriate. Pt suggest CSW contact pt's son) Affect (typically observed):  Accepting, Appropriate Orientation:  Oriented to Self, Oriented to Place, Oriented to  Time, Oriented to Situation Alcohol / Substance use:  Not Applicable Psych involvement (Current and /or in the community):  No (Comment)  Discharge Needs  Concerns to be addressed:  Basic Needs, Care Coordination Readmission within the last 30 days:  No Current discharge risk:  Dependent with Mobility, Lives alone Barriers to Discharge:  Continued Medical Work up   [employer], LCSW 09/10/2017, 11:19 AM

## 2017-09-10 NOTE — Progress Notes (Addendum)
Physical Therapy Treatment Patient Details Name: Leslie Ball MRN: [id number] DOB: [birth year] Today's Date: 09/10/2017    History of Present Illness 77 y.o. female s/p R THA 09/06/17. PMH includes: R eye blindness, RA, HTN, R hand neuromuscular disorder, B TKA    PT Comments    Pt performed gait and functional mobility.  Pt remains to require mod assistance for gait and transfers.  Pt reviewed limited HEP due to pain.  Plan for short term SNF remains appropriate at this time to improve strength and function before returning to private residence.    Interpreter used: Amal #[id number]    Follow Up Recommendations  SNF;Supervision/Assistance - 24 hour     Equipment Recommendations  (TBD at next venue)    Recommendations for Other Services OT consult     Precautions / Restrictions Precautions Precautions: Fall Restrictions Weight Bearing Restrictions: Yes RLE Weight Bearing: Weight bearing as tolerated    Mobility  Bed Mobility               General bed mobility comments: Pt in recliner on arrvial.    Transfers Overall transfer level: Needs assistance Equipment used: Rolling walker (2 wheeled) Transfers: Sit to/from Stand Sit to Stand: Mod assist         General transfer comment: Mod A to power up and stabilize, providing cues for hand placement.    Ambulation/Gait Ambulation/Gait assistance: Mod assist Ambulation Distance (Feet): 24 Feet Assistive device: Rolling walker (2 wheeled) Gait Pattern/deviations: Step-to pattern;Antalgic;Shuffle;Trunk flexed;Decreased stance time - right Gait velocity: decreased   General Gait Details: Pt able to progress gait distance with max cues for enecouragement.  Pt required cues for upper trunk control and gait symmetry.     Stairs             Wheelchair Mobility    Modified Rankin (Stroke Patients Only)       Balance Overall balance assessment: Needs assistance Sitting-balance support: Single extremity  supported Sitting balance-Leahy Scale: Good       Standing balance-Leahy Scale: Poor                              Cognition Arousal/Alertness: Awake/alert Behavior During Therapy: WFL for tasks assessed/performed Overall Cognitive Status: Within Functional Limits for tasks assessed                                        Exercises Total Joint Exercises Ankle Circles/Pumps: AROM;Both;20 reps;Supine Quad Sets: AROM;Right;10 reps;Supine Heel Slides: AROM;Right;10 reps;Supine    General Comments        Pertinent Vitals/Pain Pain Assessment: 0-10 Pain Score: 5  Pain Location: R Hip Pain Descriptors / Indicators: Discomfort;Grimacing;Aching Pain Intervention(s): Monitored during session;Premedicated before session;Repositioned    Home Living                      Prior Function            PT Goals (current goals can now be found in the care plan section) Acute Rehab PT Goals Patient Stated Goal: walk again  Potential to Achieve Goals: Good Progress towards PT goals: Progressing toward goals    Frequency    7X/week      PT Plan Current plan remains appropriate    Co-evaluation  AM-PAC PT "6 Clicks" Daily Activity  Outcome Measure  Difficulty turning over in bed (including adjusting bedclothes, sheets and blankets)?: Unable Difficulty moving from lying on back to sitting on the side of the bed? : Unable Difficulty sitting down on and standing up from a chair with arms (e.g., wheelchair, bedside commode, etc,.)?: Unable Help needed moving to and from a bed to chair (including a wheelchair)?: A Lot Help needed walking in hospital room?: A Lot Help needed climbing 3-5 steps with a railing? : Total 6 Click Score: 8    End of Session Equipment Utilized During Treatment: Gait belt Activity Tolerance: Patient limited by pain Patient left: with call bell/phone within reach;in chair Nurse Communication:  Mobility status PT Visit Diagnosis: Unsteadiness on feet (R26.81);Pain Pain - Right/Left: Right Pain - part of body: Hip     Time: 0092-3300 PT Time Calculation (min) (ACUTE ONLY): 28 min  Charges:  $Gait Training: 8-22 mins $Therapeutic Exercise: 8-22 mins                    G Codes:       Joycelyn Rua, PTA pager [phone]    Florestine Avers 09/10/2017, 11:42 AM

## 2017-09-10 NOTE — Social Work (Addendum)
CSW met with son at bedside and provided him list of SNF offers. Son will review and let CSW know which they choose for rehab.  4:30pm: CSW then met with son again and he wanted CSW to send to [facility] in [place].   CSW will f/u. Elissa Hefty, LCSW Clinical Social Worker [phone]

## 2017-09-11 NOTE — Progress Notes (Signed)
Subjective: Pt stable - pain ok   Objective: Vital signs in last 24 hours: Temp:  [97.5 F (36.4 C)-98.4 F (36.9 C)] 98.1 F (36.7 C) (04/23 0910) Pulse Rate:  [64-78] 78 (04/23 0910) Resp:  [15-16] 16 (04/23 0910) BP: (93-124)/(56-61) 124/61 (04/23 0910) SpO2:  [97 %-99 %] 97 % (04/23 0910)  Intake/Output from previous day: 04/22 0701 - 04/23 0700 In: 480 [P.O.:480] Out: -  Intake/Output this shift: No intake/output data recorded.  Exam:  Intact pulses distally Dorsiflexion/Plantar flexion intact  Labs: Recent Labs    09/10/17 0944  HGB 10.2*   Recent Labs    09/10/17 0944  HCT 33.0*   No results for input(s): NA, K, CL, CO2, BUN, CREATININE, GLUCOSE, CALCIUM in the last 72 hours. No results for input(s): LABPT, INR in the last 72 hours.  Assessment/Plan: Plan dc today to snf - medically ok - hgb stable   G Dorene Grebe 09/11/2017, 11:21 AM

## 2017-09-11 NOTE — Discharge Summary (Signed)
Physician Discharge Summary  Patient ID: Leslie Ball MRN: [id number] DOB/AGE: May 30, 1940 77 y.o.  Admit date: 09/06/2017 Discharge date: 09/11/2017  Admission Diagnoses:  Active Problems:   Hip arthritis   Discharge Diagnoses:  Same  Surgeries: Procedure(s): RIGHT TOTAL HIP ARTHROPLASTY ANTERIOR APPROACH APPLICATION OF WOUND VAC on 09/06/2017   Consultants: Treatment Team:  Preston Fleeting, MD Georgiann Hahn, MD  Discharged Condition: Leslie Ball Course: [name] is an 77 y.o. female who was admitted 09/06/2017 with a chief complaint of right hip pain, and found to have a diagnosis of   Right hip arthritis.  They were brought to the operating room on 09/06/2017 and underwent the above named procedures.  Patient tolerated the procedure well without immediate complications.  Wound VAC was utilized for the first 3 days postop.  She was slow to mobilize with physical therapy.  She is discharged to skilled nursing facility in good condition.  Patient received 2 units packed red blood cell transfusion for hemoglobin 7.9 which raised up to hematocrit over 33 at the time of discharge.  Incision intact at the time of discharge.  She will follow-up with me in 10 days.  Antibiotics given:  Anti-infectives (From admission, onward)   Start     Dose/Rate Route Frequency Ordered Stop   09/06/17 1345  ceFAZolin (ANCEF) IVPB 2g/100 mL premix     2 g 200 mL/hr over 30 Minutes Intravenous Every 6 hours 09/06/17 1336 09/06/17 2317   09/06/17 0609  ceFAZolin (ANCEF) IVPB 2g/100 mL premix     2 g 200 mL/hr over 30 Minutes Intravenous On call to O.R. 09/06/17 [id number] 09/06/17 0830    .  Recent vital signs:  Vitals:   09/11/17 0910 09/11/17 1234  BP: 124/61 110/65  Pulse: 78 68  Resp: 16   Temp: 98.1 F (36.7 C) 98.1 F (36.7 C)  SpO2: 97% 95%    Recent laboratory studies:  Results for orders placed or performed during the hospital encounter of 09/06/17  CBC  Result Value  Ref Range   WBC 7.6 4.0 - 10.5 K/uL   RBC 3.36 (L) 3.87 - 5.11 MIL/uL   Hemoglobin 7.9 (L) 12.0 - 15.0 g/dL   HCT 29.5 (L) 28.4 - 13.2 %   MCV 77.7 (L) 78.0 - 100.0 fL   MCH 23.5 (L) 26.0 - 34.0 pg   MCHC 30.3 30.0 - 36.0 g/dL   RDW 44.0 (H) 10.2 - 72.5 %   Platelets 332 150 - 400 K/uL  Hemoglobin and hematocrit, blood  Result Value Ref Range   Hemoglobin 10.2 (L) 12.0 - 15.0 g/dL   HCT 36.6 (L) 44.0 - 34.7 %  I-STAT 4, (NA,K, GLUC, HGB,HCT)  Result Value Ref Range   Sodium 137 135 - 145 mmol/L   Potassium 3.7 3.5 - 5.1 mmol/L   Glucose, Bld 148 (H) 65 - 99 mg/dL   HCT 42.5 (L) 95.6 - 38.7 %   Hemoglobin 9.2 (L) 12.0 - 15.0 g/dL  Prepare RBC  Result Value Ref Range   Order Confirmation      ORDER PROCESSED BY BLOOD BANK Performed at [facility], [address], [place], [place] [address]     Discharge Medications:   Allergies as of 09/11/2017      Reactions   Oxycodone Itching   Chlorhexidine Itching   REACTION: redness      Medication List    STOP taking these medications   DSS 100 MG Caps  oxybutynin 5 MG tablet Commonly known as:  DITROPAN   oxyCODONE 5 MG immediate release tablet Commonly known as:  Oxy IR/ROXICODONE   Travoprost (BAK Free) 0.004 % Soln ophthalmic solution Commonly known as:  TRAVATAN Replaced by:  latanoprost 0.005 % ophthalmic solution     TAKE these medications   brinzolamide 1 % ophthalmic suspension Commonly known as:  AZOPT Place 1 drop into the left eye 3 (three) times daily.   ENSURE Take 237 mLs by mouth daily.   Fluticasone-Salmeterol 250-50 MCG/DOSE Aepb Commonly known as:  ADVAIR Inhale 1 puff into the lungs 2 (two) times daily as needed (for shortness of breath).   gabapentin 100 MG capsule Commonly known as:  NEURONTIN Take 100 mg by mouth 2 (two) times daily.   hydrochlorothiazide 12.5 MG capsule Commonly known as:  MICROZIDE Take 12.5 mg by mouth daily.   HYDROcodone-acetaminophen 5-325 MG  tablet Commonly known as:  NORCO/VICODIN Take 2 tablets by mouth every 6 (six) hours as needed for moderate pain.   latanoprost 0.005 % ophthalmic solution Commonly known as:  XALATAN Place 1 drop into the left eye at bedtime. Replaces:  Travoprost (BAK Free) 0.004 % Soln ophthalmic solution   methocarbamol 500 MG tablet Commonly known as:  ROBAXIN Take 1 tablet (500 mg total) by mouth every 8 (eight) hours as needed for muscle spasms.   metoprolol succinate 25 MG 24 hr tablet Commonly known as:  TOPROL-XL Take 25 mg by mouth daily.   rivaroxaban 10 MG Tabs tablet Commonly known as:  XARELTO Take 1 tablet (10 mg total) by mouth daily with breakfast.   theophylline 300 MG 12 hr tablet Commonly known as:  THEODUR Take 150 mg by mouth at bedtime.   traMADol 50 MG tablet Commonly known as:  ULTRAM Take 1 tablet (50 mg total) by mouth every 12 (twelve) hours as needed (FOR PAIN.). What changed:    when to take this  reasons to take this   zolpidem 5 MG tablet Commonly known as:  AMBIEN Take 1 tablet (5 mg total) by mouth at bedtime as needed for sleep.            Durable Medical Equipment  (From admission, onward)        Start     Ordered   09/07/17 1404  For home use only DME 3 n 1  Once     09/07/17 1403      Diagnostic Studies: Dg C-arm 1-60 Min  Result Date: 09/06/2017 CLINICAL DATA:  Status post right total hip arthroplasty. EXAM: DG C-ARM 61-120 MIN; OPERATIVE RIGHT HIP WITH PELVIS FLUOROSCOPY TIME:  49 seconds. COMPARISON:  Radiographs of June 13, 2017. FINDINGS: Two intraoperative fluoroscopic images were obtained of the right hip. The femoral and acetabular components appear to be well situated. No fracture or dislocation is noted. Previously placed left hip arthroplasty is again noted. IMPRESSION: Status post right total hip arthroplasty. Electronically Signed   By: Lupita Raider, M.D.   On: 09/06/2017 11:02   Dg C-arm 1-60 Min  Result Date:  09/06/2017 CLINICAL DATA:  Status post right total hip arthroplasty. EXAM: DG C-ARM 61-120 MIN; OPERATIVE RIGHT HIP WITH PELVIS FLUOROSCOPY TIME:  49 seconds. COMPARISON:  Radiographs of June 13, 2017. FINDINGS: Two intraoperative fluoroscopic images were obtained of the right hip. The femoral and acetabular components appear to be well situated. No fracture or dislocation is noted. Previously placed left hip arthroplasty is again noted. IMPRESSION: Status post right total  hip arthroplasty. Electronically Signed   By: Lupita Raider, M.D.   On: 09/06/2017 11:02   Dg Hip Port Unilat With Pelvis 1v Right  Result Date: 09/06/2017 CLINICAL DATA:  Status post total hip replacement on the right EXAM: DG HIP (WITH OR WITHOUT PELVIS) 2+ V PORT RIGHT COMPARISON:  June 13, 2017; intraoperative images September 06, 2017 FINDINGS: Frontal pelvis as well as frontal and lateral right hip images were obtained. There are total hip replacements bilaterally with prosthetic components appearing well-seated bilaterally. No acute fracture or dislocation. Extensive calcification in the leftward aspect of the pelvis is felt to be indicative of uterine leiomyomatous change. There is degenerative change in each sacroiliac joint. IMPRESSION: Total hip replacements bilaterally with prosthetic components well-seated bilaterally. No acute fracture or dislocation. Degenerative change in each sacroiliac joint. 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 Electronically Signed   By: Bretta Bang III M.D.   On: 09/06/2017 13:02   Dg Hip Operative Unilat W Or W/o Pelvis Right  Result Date: 09/06/2017 CLINICAL DATA:  Status post right total hip arthroplasty. EXAM: DG C-ARM 61-120 MIN; OPERATIVE RIGHT HIP WITH PELVIS FLUOROSCOPY TIME:  49 seconds. COMPARISON:  Radiographs of June 13, 2017. FINDINGS: Two intraoperative fluoroscopic images were obtained of the right hip. The femoral and acetabular  components appear to be well situated. No fracture or dislocation is noted. Previously placed left hip arthroplasty is again noted. IMPRESSION: Status post right total hip arthroplasty. Electronically Signed   By: Lupita Raider, M.D.   On: 09/06/2017 11:02    Disposition: Discharge disposition: 03-Skilled Nursing Facility       Discharge Instructions    Call MD / Call 911   Complete by:  As directed    If you experience chest pain or shortness of breath, CALL 911 and be transported to the hospital emergency room.  If you develope a fever above 101 F, pus (white drainage) or increased drainage or redness at the wound, or calf pain, call your surgeon's office.   Call MD / Call 911   Complete by:  As directed    If you experience chest pain or shortness of breath, CALL 911 and be transported to the hospital emergency room.  If you develope a fever above 101 F, pus (white drainage) or increased drainage or redness at the wound, or calf pain, call your surgeon's office.   Call MD / Call 911   Complete by:  As directed    If you experience chest pain or shortness of breath, CALL 911 and be transported to the hospital emergency room.  If you develope a fever above 101 F, pus (white drainage) or increased drainage or redness at the wound, or calf pain, call your surgeon's office.   Constipation Prevention   Complete by:  As directed    Drink plenty of fluids.  Prune juice may be helpful.  You may use a stool softener, such as Colace (over the counter) 100 mg twice a day.  Use MiraLax (over the counter) for constipation as needed.   Constipation Prevention   Complete by:  As directed    Drink plenty of fluids.  Prune juice may be helpful.  You may use a stool softener, such as Colace (over the counter) 100 mg twice a day.  Use MiraLax (over the counter) for constipation as needed.   Constipation Prevention   Complete by:  As directed    Drink plenty of fluids.  Prune juice  may be helpful.  You  may use a stool softener, such as Colace (over the counter) 100 mg twice a day.  Use MiraLax (over the counter) for constipation as needed.   Diet - low sodium heart healthy   Complete by:  As directed    Diet - low sodium heart healthy   Complete by:  As directed    Diet - low sodium heart healthy   Complete by:  As directed    Discharge instructions   Complete by:  As directed    Weight bearing as tolerated with crutches or walker Dressing to stay in place until rov   Increase activity slowly as tolerated   Complete by:  As directed    Increase activity slowly as tolerated   Complete by:  As directed    Increase activity slowly as tolerated   Complete by:  As directed       Contact information for after-discharge care    Destination    [facility] .   Service:  Skilled Nursing Contact information: [address] [place] [address] [phone]               Signed: Burnard Bunting 09/11/2017, 3:10 PM

## 2017-09-11 NOTE — Progress Notes (Signed)
Physical Therapy Treatment Patient Details Name: Leslie Ball MRN: [id number] DOB: Feb 24, 1941 Today's Date: 09/11/2017    History of Present Illness 77 y.o. female s/p R THA 09/06/17. PMH includes: R eye blindness, RA, HTN, R hand neuromuscular disorder, B TKA    PT Comments    Pt performed gait and functional mobility today.  She remains to require ModA from standard seat height for transfers. Pt was able to stand up from an elevated BSC with only MinA.   She only required MinA for gait today but requires chair follow for safety.  Pt reviewed limited HEP due to pain again.  Discussed skilled Home health PT vs SNF with pt. and son  At this time short term SNF remains appropriate to increase functional independence and safety before returning home.  Interpreter used: [employer] #[id number]   Follow Up Recommendations  SNF;Supervision/Assistance - 24 hour     Equipment Recommendations       Recommendations for Other Services OT consult     Precautions / Restrictions Precautions Precautions: Fall Restrictions Weight Bearing Restrictions: No RLE Weight Bearing: Weight bearing as tolerated    Mobility  Bed Mobility Overal bed mobility: Needs Assistance Bed Mobility: Sit to Supine       Sit to supine: Mod assist   General bed mobility comments: Pt in recliner on arrival.  Pt needed ModA to get legs situated onto bed during Sit->supine.  Transfers Overall transfer level: Needs assistance Equipment used: Rolling walker (2 wheeled) Transfers: Sit to/from [employer] Sit to Stand: Mod assist Stand pivot transfers: Mod assist       General transfer comment: ModA sit->stand from chair, but only MinA sit->stand from elevated BSC.  Ambulation/Gait Ambulation/Gait assistance: Min assist;+2 safety/equipment(For chair follow.) Ambulation Distance (Feet): 30 Feet Assistive device: Rolling walker (2 wheeled) Gait Pattern/deviations: Step-to  pattern;Antalgic;Shuffle;Trunk flexed;Decreased stance time - right Gait velocity: decreased   General Gait Details: Pt able to progress gait distance with cues for enecouragement.  Pt required cues for upper trunk control and gait symmetry.     Stairs             Wheelchair Mobility    Modified Rankin (Stroke Patients Only)       Balance Overall balance assessment: Needs assistance Sitting-balance support: Single extremity supported                                        Cognition Arousal/Alertness: Awake/alert Behavior During Therapy: WFL for tasks assessed/performed Overall Cognitive Status: Within Functional Limits for tasks assessed                                        Exercises Total Joint Exercises Hip ABduction/ADduction: AROM;10 reps;Left;Seated Straight Leg Raises: AROM;10 reps;Left;Seated Long Arc Quad: AROM;10 reps;Left;Seated    General Comments        Pertinent Vitals/Pain Pain Assessment: Faces Faces Pain Scale: Hurts even more Pain Location: R Hip Pain Descriptors / Indicators: Discomfort;Grimacing;Aching Pain Intervention(s): Limited activity within patient's tolerance;Monitored during session    Home Living                      Prior Function            PT Goals (current goals can now be found in the  care plan section) Acute Rehab PT Goals Patient Stated Goal: walk again  PT Goal Formulation: With patient Time For Goal Achievement: 09/14/17 Potential to Achieve Goals: Good Progress towards PT goals: Progressing toward goals    Frequency    7X/week      PT Plan Current plan remains appropriate    Co-evaluation              AM-PAC PT "6 Clicks" Daily Activity  Outcome Measure  Difficulty turning over in bed (including adjusting bedclothes, sheets and blankets)?: Unable Difficulty moving from lying on back to sitting on the side of the bed? : Unable Difficulty sitting down  on and standing up from a chair with arms (e.g., wheelchair, bedside commode, etc,.)?: Unable Help needed moving to and from a bed to chair (including a wheelchair)?: A Lot Help needed walking in hospital room?: A Little Help needed climbing 3-5 steps with a railing? : Total 6 Click Score: 9    End of Session Equipment Utilized During Treatment: Gait belt Activity Tolerance: Patient limited by pain Patient left: with call bell/phone within reach;in bed Nurse Communication: Mobility status PT Visit Diagnosis: Unsteadiness on feet (R26.81);Pain Pain - Right/Left: Right Pain - part of body: Hip     Time: 1126-1200 PT Time Calculation (min) (ACUTE ONLY): 34 min  Charges:  $Gait Training: 8-22 mins $Therapeutic Activity: 8-22 mins                    G Codes:       Rosann Auerbach, SPTA [phone]    Rosann Auerbach 09/11/2017, 1:00 PM

## 2017-09-11 NOTE — Social Work (Signed)
Clinical Social Worker facilitated patient discharge including contacting patient family and facility to confirm patient discharge plans.  Clinical information faxed to facility and family agreeable with plan.    CSW arranged ambulance transport via [facility] to [facility].    RN to call report prior to discharge.  Pt going to Room 205.  Clinical Social Worker will sign off for now as social work intervention is no longer needed. Please consult [place] again if new need arises.  Keene Breath, LCSW Clinical Social Worker [phone]

## 2017-09-11 NOTE — Progress Notes (Signed)
Rehab facilities under reviewed by family Plan discharge today or tomorrow if necessary Continue therapy today

## 2017-09-11 NOTE — Social Work (Signed)
CSW spoke with son and confirmed SNF bed at [facility].  CSW then contacted SNF to confirm and CSW will get dc summary to SNF once it is complete.  Keene Breath, LCSW Clinical Social Worker [phone]

## 2017-09-11 NOTE — Clinical Social Work Placement (Signed)
   CLINICAL SOCIAL WORK PLACEMENT  NOTE  Date:  09/11/2017  Patient Details  Name: Leslie Ball MRN: [id number] Date of Birth: 1941-04-22  Clinical Social Work is seeking post-discharge placement for this patient at the Skilled  Nursing Facility level of care (*CSW will initial, date and re-position this form in  chart as items are completed):  Yes   Patient/family provided with [place] Clinical Social Work Department's list of facilities offering this level of care within the geographic area requested by the patient (or if unable, by the patient's family).  Yes   Patient/family informed of their freedom to choose among providers that offer the needed level of care, that participate in Medicare, Medicaid or managed care program needed by the patient, have an available bed and are willing to accept the patient.  Yes   Patient/family informed of [facility]'s ownership interest in [facility] and [facility], as well as of the fact that they are under no obligation to receive care at these facilities.  PASRR submitted to EDS on       PASRR number received on 09/10/17     Existing PASRR number confirmed on       FL2 transmitted to all facilities in geographic area requested by pt/family on 09/10/17     FL2 transmitted to all facilities within larger geographic area on       Patient informed that his/her managed care company has contracts with or will negotiate with certain facilities, including the following:        Yes   Patient/family informed of bed offers received.  Patient chooses bed at [facility]     Physician recommends and patient chooses bed at      Patient to be transferred to [facility] on 09/11/17.  Patient to be transferred to facility by [facility]     Patient family notified on 09/11/17 of transfer.  Name of family member notified:  Son, Sam contacted     PHYSICIAN       Additional Comment:     _______________________________________________ Tresa Moore, LCSW 09/11/2017, 2:58 PM

## 2017-09-11 NOTE — Discharge Summary (Signed)
Physician Discharge Summary  Patient ID: [name] MRN: [id number] DOB/AGE: 76/25/1942 76 y.o.  Admit date: 09/06/2017 Discharge date: 09/11/2017  Admission Diagnoses:  Active Problems:   Hip arthritis   Discharge Diagnoses:  Same  Surgeries: Procedure(s): RIGHT TOTAL HIP ARTHROPLASTY ANTERIOR APPROACH APPLICATION OF WOUND VAC on 09/06/2017   Consultants: Treatment Team:  Preston Fleeting, MD Georgiann Hahn, MD  Discharged Condition: [name] Course: Leslie Ball is an 77 y.o. female who was admitted 09/06/2017 with a chief complaint of right hip pain, and found to have a diagnosis of right hip arthritis.  They were brought to the operating room on 09/06/2017 and underwent the above named procedures.  Patient tolerated the procedure well.  She was slow to mobilize because of preoperative deconditioning.  Patient was transfused 2 units of packed red blood cells for hemoglobin of 7.9 on postop day #1.  She responded nicely and hematocrit over 30 at the time of discharge.  She was slow to mobilize with physical therapy but was able to be weightbearing as tolerated.  She will need rehabilitation and physical therapy for 1 to 2 weeks before she is safe to discharge to home.  I will see her back in about 10 days for clinical recheck.  She is discharged to skilled nursing in good condition today.  Antibiotics given:  Anti-infectives (From admission, onward)   Start     Dose/Rate Route Frequency Ordered Stop   09/06/17 1345  ceFAZolin (ANCEF) IVPB 2g/100 mL premix     2 g 200 mL/hr over 30 Minutes Intravenous Every 6 hours 09/06/17 1336 09/06/17 2317   09/06/17 0609  ceFAZolin (ANCEF) IVPB 2g/100 mL premix     2 g 200 mL/hr over 30 Minutes Intravenous On call to O.R. 09/06/17 3762 09/06/17 0830    .  Recent vital signs:  Vitals:   09/11/17 0910 09/11/17 1234  BP: 124/61 110/65  Pulse: 78 68  Resp: 16   Temp: 98.1 F (36.7 C) 98.1 F (36.7 C)  SpO2: 97% 95%     Recent laboratory studies:  Results for orders placed or performed during the hospital encounter of 09/06/17  CBC  Result Value Ref Range   WBC 7.6 4.0 - 10.5 K/uL   RBC 3.36 (L) 3.87 - 5.11 MIL/uL   Hemoglobin 7.9 (L) 12.0 - 15.0 g/dL   HCT 83.1 (L) 51.7 - 61.6 %   MCV 77.7 (L) 78.0 - 100.0 fL   MCH 23.5 (L) 26.0 - 34.0 pg   MCHC 30.3 30.0 - 36.0 g/dL   RDW 07.3 (H) 71.0 - 62.6 %   Platelets 332 150 - 400 K/uL  Hemoglobin and hematocrit, blood  Result Value Ref Range   Hemoglobin 10.2 (L) 12.0 - 15.0 g/dL   HCT 94.8 (L) 54.6 - 27.0 %  I-STAT 4, (NA,K, GLUC, HGB,HCT)  Result Value Ref Range   Sodium 137 135 - 145 mmol/L   Potassium 3.7 3.5 - 5.1 mmol/L   Glucose, Bld 148 (H) 65 - 99 mg/dL   HCT 35.0 (L) 09.3 - 81.8 %   Hemoglobin 9.2 (L) 12.0 - 15.0 g/dL  Prepare RBC  Result Value Ref Range   Order Confirmation      ORDER PROCESSED BY BLOOD BANK Performed at [facility], [address], [place], [place] [address]     Discharge Medications:     Diagnostic Studies: Dg C-arm 1-60 Min  Result Date: 09/06/2017 CLINICAL DATA:  Status post  right total hip arthroplasty. EXAM: DG C-ARM 61-120 MIN; OPERATIVE RIGHT HIP WITH PELVIS FLUOROSCOPY TIME:  49 seconds. COMPARISON:  Radiographs of June 13, 2017. FINDINGS: Two intraoperative fluoroscopic images were obtained of the right hip. The femoral and acetabular components appear to be well situated. No fracture or dislocation is noted. Previously placed left hip arthroplasty is again noted. IMPRESSION: Status post right total hip arthroplasty. Electronically Signed   By: Lupita Raider, M.D.   On: 09/06/2017 11:02   Dg C-arm 1-60 Min  Result Date: 09/06/2017 CLINICAL DATA:  Status post right total hip arthroplasty. EXAM: DG C-ARM 61-120 MIN; OPERATIVE RIGHT HIP WITH PELVIS FLUOROSCOPY TIME:  49 seconds. COMPARISON:  Radiographs of June 13, 2017. FINDINGS: Two intraoperative fluoroscopic images were obtained of the  right hip. The femoral and acetabular components appear to be well situated. No fracture or dislocation is noted. Previously placed left hip arthroplasty is again noted. IMPRESSION: Status post right total hip arthroplasty. Electronically Signed   By: Lupita Raider, M.D.   On: 09/06/2017 11:02   Dg Hip Port Unilat With Pelvis 1v Right  Result Date: 09/06/2017 CLINICAL DATA:  Status post total hip replacement on the right EXAM: DG HIP (WITH OR WITHOUT PELVIS) 2+ V PORT RIGHT COMPARISON:  June 13, 2017; intraoperative images September 06, 2017 FINDINGS: Frontal pelvis as well as frontal and lateral right hip images were obtained. There are total hip replacements bilaterally with prosthetic components appearing well-seated bilaterally. No acute fracture or dislocation. Extensive calcification in the leftward aspect of the pelvis is felt to be indicative of uterine leiomyomatous change. There is degenerative change in each sacroiliac joint. IMPRESSION: Total hip replacements bilaterally with prosthetic components well-seated bilaterally. No acute fracture or dislocation. Degenerative change in each sacroiliac joint. 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 Electronically Signed   By: Bretta Bang III M.D.   On: 09/06/2017 13:02   Dg Hip Operative Unilat W Or W/o Pelvis Right  Result Date: 09/06/2017 CLINICAL DATA:  Status post right total hip arthroplasty. EXAM: DG C-ARM 61-120 MIN; OPERATIVE RIGHT HIP WITH PELVIS FLUOROSCOPY TIME:  49 seconds. COMPARISON:  Radiographs of June 13, 2017. FINDINGS: Two intraoperative fluoroscopic images were obtained of the right hip. The femoral and acetabular components appear to be well situated. No fracture or dislocation is noted. Previously placed left hip arthroplasty is again noted. IMPRESSION: Status post right total hip arthroplasty. Electronically Signed   By: Lupita Raider, M.D.   On: 09/06/2017 11:02    Disposition:  Discharge disposition: 03-Skilled Nursing Facility       Discharge Instructions    Call MD / Call 911   Complete by:  As directed    If you experience chest pain or shortness of breath, CALL 911 and be transported to the hospital emergency room.  If you develope a fever above 101 F, pus (white drainage) or increased drainage or redness at the wound, or calf pain, call your surgeon's office.   Constipation Prevention   Complete by:  As directed    Drink plenty of fluids.  Prune juice may be helpful.  You may use a stool softener, such as Colace (over the counter) 100 mg twice a day.  Use MiraLax (over the counter) for constipation as needed.   Diet - low sodium heart healthy   Complete by:  As directed    Discharge instructions   Complete by:  As directed    Weight bearing as tolerated  with crutches or walker Dressing to stay in place until rov   Increase activity slowly as tolerated   Complete by:  As directed          Signed: Burnard Bunting 09/11/2017, 2:40 PM

## 2017-09-11 NOTE — Progress Notes (Signed)
Report given to [facility] [place].  Discharge instructions printed and will be sent via PTAR for discharge.  The patient's family is aware of discharge

## 2017-09-11 NOTE — Social Work (Addendum)
CSW spoke with son this day and discussed SNF offers.  CSW advised patient that patient should be ready to dc today.  Pt has medicaid and has limited SNF options and CSW provided SNF offers to choose from. Pt/family will need to select a SNF from the few provided, private pay or have home health services.  CSW will f/u for disposition.  10:50am: CSW contacted Son and advised that another SNF made a bed offer [facility]. Son will f/u on SNF placement.  CSW will f/u for disposition.  Keene Breath, LCSW Clinical Social Worker [phone]

## 2017-09-11 NOTE — Telephone Encounter (Signed)
I called and initially told the daughter-in-law that she could stay today while they are looking at other places.  Discussed with social worker how with her insurance Medicaid she is bed offers from the 4 facilities within [facility] [place] that actually take [place].  For that reason based on her medical stability she really needs to decide on that place today and go ahead and have out to 1 of those places.  I will check her back in about 10 days.  I also left a message with her son Doreatha Martin as to this.

## 2017-09-11 NOTE — Social Work (Signed)
CSW received a call back from son to discuss SNF [facility]. Son is following up on SNF places. He indicated an interest in [facility]. CSW confirmed that referral was sent to SNF and they have not made a bed offers. CSW then called admission staff at [facility] and left message for a call back on placement.  CSW will f/u.  Keene Breath, LCSW Clinical Social Worker [phone]

## 2017-09-11 NOTE — Plan of Care (Signed)
progressing 

## 2017-09-28 ENCOUNTER — Telehealth (INDEPENDENT_AMBULATORY_CARE_PROVIDER_SITE_OTHER): Payer: Self-pay

## 2017-09-28 NOTE — Telephone Encounter (Signed)
Patient's daughter-in-law Shawna Orleans called concerning patient.  I called and left a VM for her to return my call about patient making an appt. To see Dr. August Saucer.

## 2017-09-28 NOTE — Telephone Encounter (Signed)
IC advised ok but patient has not been seen at all for post op check and it has been 3 weeks since surgery. Scheduled post op appt for Monday at 9 a.m

## 2017-09-28 NOTE — Telephone Encounter (Signed)
Patient's daughter-in-law Shawna Orleans returned my call stating that patient still has bandage on from when she had right total hip surgery on 09/06/17 with Dr. August Saucer.  Would like to know if it would be okay for the doctor that comes to [facility] to remove bandage and look at incision?  Cb# for Shawna Orleans is [phone]. Contact Lamona Curl at [employer] [phone] to advise.  Thank you.

## 2017-10-01 ENCOUNTER — Encounter (INDEPENDENT_AMBULATORY_CARE_PROVIDER_SITE_OTHER): Payer: Self-pay | Admitting: Orthopedic Surgery

## 2017-10-01 ENCOUNTER — Ambulatory Visit (INDEPENDENT_AMBULATORY_CARE_PROVIDER_SITE_OTHER): Payer: Medicaid Other | Admitting: Orthopedic Surgery

## 2017-10-01 ENCOUNTER — Ambulatory Visit (INDEPENDENT_AMBULATORY_CARE_PROVIDER_SITE_OTHER): Payer: Medicaid Other

## 2017-10-01 ENCOUNTER — Ambulatory Visit (INDEPENDENT_AMBULATORY_CARE_PROVIDER_SITE_OTHER): Payer: Self-pay

## 2017-10-01 DIAGNOSIS — M25511 Pain in right shoulder: Secondary | ICD-10-CM | POA: Diagnosis not present

## 2017-10-01 DIAGNOSIS — Z96641 Presence of right artificial hip joint: Secondary | ICD-10-CM

## 2017-10-01 DIAGNOSIS — M25512 Pain in left shoulder: Secondary | ICD-10-CM

## 2017-10-01 DIAGNOSIS — G8929 Other chronic pain: Secondary | ICD-10-CM

## 2017-10-01 NOTE — Progress Notes (Signed)
Post-Op Visit Note   Patient: Leslie Ball           Date of Birth: [birth year]           MRN: [id number] Visit Date: 10/01/2017 PCP: Fleet Contras, MD   Assessment & Plan:  Chief Complaint:  Chief Complaint  Patient presents with  . Right Hip - Routine Post Op   Visit Diagnoses:  1. Status post total replacement of right hip   2. Chronic pain of both shoulders     Plan: Patient presents now 3 weeks out right total hip replacement.  She is been doing well in physical therapy at the nursing home.  She is being discharged and released on May 23.  She also reports bilateral shoulder pain which is likely been exacerbated by weightbearing.  Radiographs of the hip look good on the right-hand side.  Shoulder radiographs do not show much in terms of arthritis but she does have some coarse grinding and crepitus on examination consistent with possible cuff pathology.  We need to arrange for home health physical therapy for 1 month after discharge 2 times a week when she gets home.  Patient is homebound.  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  We will also have them work on both shoulders.  Come back in 3 weeks which will be 6 weeks out after her surgery for bilateral shoulder injections to help with the pain.  Follow-Up Instructions: Return in about 1 month (around 10/29/2017).   Orders:  Orders Placed This Encounter  Procedures  . XR HIP UNILAT W OR W/O PELVIS 2-3 VIEWS RIGHT  . XR Shoulder Right   No orders of the defined types were placed in this encounter.   Imaging: Xr Hip Unilat W Or W/o Pelvis 2-3 Views Right  Result Date: 10/01/2017 AP pelvis lateral right hip reviewed.  Total hip prosthesis in good position and alignment.  No periprosthetic fracture or hardware complications.  Leg lengths approximately equal.  Xr Shoulder Right  Result Date: 10/01/2017 AP right and left shoulder reviewed.  Acromiohumeral distance maintained.  No significant spurring off of the inferior  humeral head.  Shoulder appears located on single view.  Bilaterally.   PMFS History: Patient Active Problem List   Diagnosis Date Noted  . Hip arthritis 07/28/2016  . Primary osteoarthritis of left hip 06/15/2016  . Nausea with vomiting 07/21/2013  . Unspecified constipation 07/21/2013  . Abdominal pain, epigastric 07/21/2013  . Arthritis of knee 07/15/2013  . Arthritis of left knee 04/15/2013   Past Medical History:  Diagnosis Date  . Anemia   . Arthritis    "left knee; probably left shoulder & elbow" (07/15/2013)  . Asthma   . 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 Constipation    occasionally  . GERD (gastroesophageal reflux disease)    rare use of Pepto bismol  . Hypertension   . Neuromuscular disorder (HCC)    right hand has "pins and needles" feeling at times  . RA (rheumatoid arthritis) (HCC)     History reviewed. No pertinent family history.  Past Surgical History:  Procedure Laterality Date  . APPLICATION OF WOUND VAC Right 09/06/2017   Procedure: APPLICATION OF WOUND VAC;  Surgeon: Cammy Copa, MD;  Location: [facility] OR;  Service: Orthopedics;  Laterality: Right;  . CATARACT EXTRACTION W/ INTRAOCULAR LENS IMPLANT Bilateral   .  EYE SURGERY Right   . JOINT REPLACEMENT    . REFRACTIVE SURGERY Bilateral   . RETINAL DETACHMENT SURGERY Right   . TOTAL HIP ARTHROPLASTY Left 07/28/2016   Procedure: LEFT ANTERIOR TOTAL HIP ARTHROPLASTY;  Surgeon: Cammy Copa, MD;  Location: [facility] OR;  Service: Orthopedics;  Laterality: Left;  . TOTAL HIP ARTHROPLASTY Right 09/06/2017   Procedure: RIGHT TOTAL HIP ARTHROPLASTY ANTERIOR APPROACH;  Surgeon: Cammy Copa, MD;  Location: [facility] OR;  Service: Orthopedics;  Laterality: Right;  . TOTAL KNEE ARTHROPLASTY Right 04/15/2013   Procedure: TOTAL KNEE ARTHROPLASTY;  Surgeon: Cammy Copa, MD;  Location: [facility] OR;  Service: Orthopedics;  Laterality:  Right;  . TOTAL KNEE ARTHROPLASTY Left 07/15/2013  . TOTAL KNEE ARTHROPLASTY Left 07/15/2013   Procedure: TOTAL KNEE ARTHROPLASTY- left;  Surgeon: Cammy Copa, MD;  Location: [facility] OR;  Service: Orthopedics;  Laterality: Left;  . TOTAL KNEE ARTHROPLASTY Left 07/28/2016   Social History   Occupational History  . Not on file  Tobacco Use  . Smoking status: Never Smoker  . Smokeless tobacco: Never Used  Substance and Sexual Activity  . Alcohol use: No  . Drug use: No  . Sexual activity: Not on file

## 2017-10-16 ENCOUNTER — Telehealth (INDEPENDENT_AMBULATORY_CARE_PROVIDER_SITE_OTHER): Payer: Self-pay | Admitting: Orthopedic Surgery

## 2017-10-16 NOTE — Telephone Encounter (Signed)
Please advise 

## 2017-10-16 NOTE — Telephone Encounter (Signed)
IC verbal given.  

## 2017-10-16 NOTE — Telephone Encounter (Signed)
Amanda from [facility] called requesting VO for the following:  2x a week for 3 weeks for medication pain management.  CB#[phone].  Thank you.

## 2017-10-16 NOTE — Telephone Encounter (Signed)
y

## 2017-10-22 ENCOUNTER — Ambulatory Visit (INDEPENDENT_AMBULATORY_CARE_PROVIDER_SITE_OTHER): Payer: Medicaid Other | Admitting: Orthopedic Surgery

## 2017-10-23 ENCOUNTER — Telehealth (INDEPENDENT_AMBULATORY_CARE_PROVIDER_SITE_OTHER): Payer: Self-pay | Admitting: Orthopedic Surgery

## 2017-10-23 NOTE — Telephone Encounter (Signed)
[place] with [facility] called needing verbal orders  Insurance only approved 3 visits  2x a week for 1 week 1x week for 1 week  Please call to advise.  [phone]

## 2017-10-24 NOTE — Telephone Encounter (Signed)
IC verbal given.  

## 2017-10-31 ENCOUNTER — Ambulatory Visit (INDEPENDENT_AMBULATORY_CARE_PROVIDER_SITE_OTHER): Payer: Medicaid Other | Admitting: Orthopedic Surgery

## 2017-10-31 ENCOUNTER — Encounter (INDEPENDENT_AMBULATORY_CARE_PROVIDER_SITE_OTHER): Payer: Self-pay | Admitting: Orthopedic Surgery

## 2017-10-31 DIAGNOSIS — M19011 Primary osteoarthritis, right shoulder: Secondary | ICD-10-CM | POA: Diagnosis not present

## 2017-10-31 DIAGNOSIS — M19012 Primary osteoarthritis, left shoulder: Secondary | ICD-10-CM | POA: Diagnosis not present

## 2017-10-31 MED ORDER — LIDOCAINE HCL 1 % IJ SOLN
5.0000 mL | INTRAMUSCULAR | Status: AC | PRN
Start: 2017-10-31 — End: 2017-10-31
  Administered 2017-10-31: 5 mL

## 2017-10-31 MED ORDER — LIDOCAINE HCL 1 % IJ SOLN
5.0000 mL | INTRAMUSCULAR | Status: AC | PRN
  Administered 2017-10-31: 5 mL

## 2017-10-31 MED ORDER — METHYLPREDNISOLONE ACETATE 40 MG/ML IJ SUSP
40.0000 mg | INTRAMUSCULAR | Status: AC | PRN
  Administered 2017-10-31: 40 mg via INTRA_ARTICULAR

## 2017-10-31 MED ORDER — BUPIVACAINE HCL 0.5 % IJ SOLN
9.0000 mL | INTRAMUSCULAR | Status: AC | PRN
  Administered 2017-10-31: 9 mL via INTRA_ARTICULAR

## 2017-10-31 NOTE — Progress Notes (Signed)
   Procedure Note  Patient: Leslie Ball             Date of Birth: 1940/12/28           MRN: [id number]             Visit Date: 10/31/2017  Procedures: Visit Diagnoses: Bilateral shoulder region arthritis  Large Joint Inj: bilateral glenohumeral on 10/31/2017 12:35 PM Indications: diagnostic evaluation and pain Details: 18 G 1.5 in needle, posterior approach  Arthrogram: No  Medications (Right): 5 mL lidocaine 1 %; 9 mL bupivacaine 0.5 %; 40 mg methylPREDNISolone acetate 40 MG/ML Medications (Left): 5 mL lidocaine 1 %; 9 mL bupivacaine 0.5 %; 40 mg methylPREDNISolone acetate 40 MG/ML Outcome: tolerated well, no immediate complications Procedure, treatment alternatives, risks and benefits explained, specific risks discussed. Consent was given by the patient. Immediately prior to procedure a time out was called to verify the correct patient, procedure, equipment, support staff and site/side marked as required. Patient was prepped and draped in the usual sterile fashion.    Patient with bilateral shoulder arthritis presents for bilateral shoulder injections.  She does have an effusion in the right shoulder and range of motion consistent with shoulder arthritis.  No effusion in the left shoulder but definitely coarseness consistent with shoulder arthritis

## 2018-02-20 ENCOUNTER — Ambulatory Visit (INDEPENDENT_AMBULATORY_CARE_PROVIDER_SITE_OTHER): Payer: Medicaid Other | Admitting: Orthopedic Surgery

## 2018-06-26 IMAGING — CR DG ORBITS FOR FOREIGN BODY
2 series · 2 of 2 positions shown · non-contrast
Comparison: None.

CLINICAL DATA: History of retinal detachment surgery, pre MRI
screening

EXAM:
ORBITS FOR FOREIGN BODY - 2 VIEW

[w orbit pa (1 of 2)]
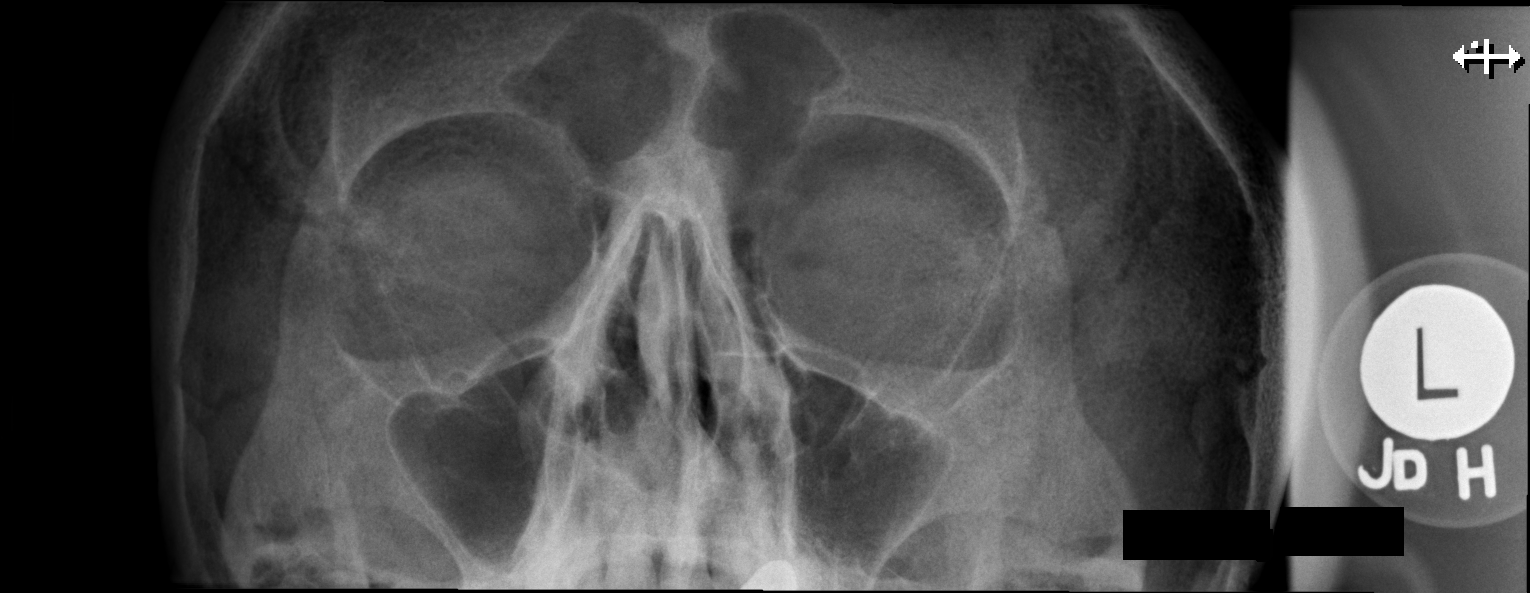

[w orbit pa (2 of 2)]
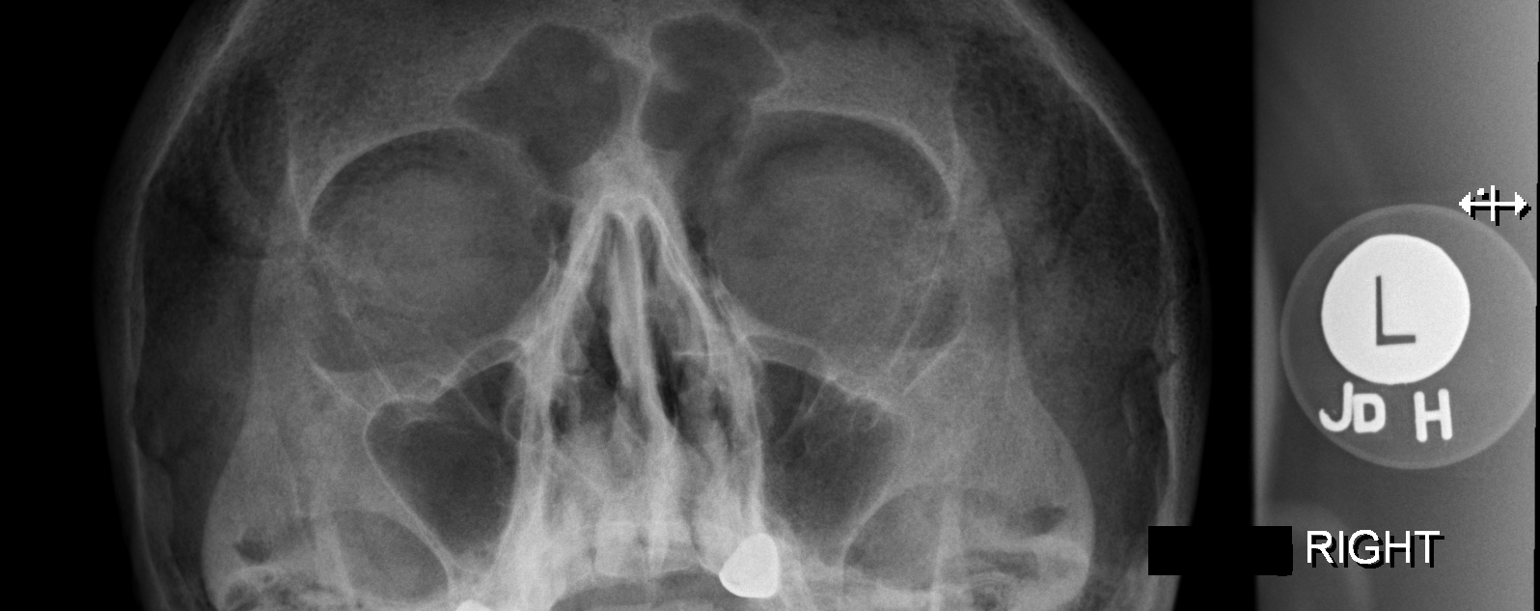

[2 of 2 positions shown; findings below may reference images not displayed]

FINDINGS: Views of the orbits were obtained with the patient looking the left
and looking to the right. No orbital metallic foreign body is seen.
No bony abnormality is noted. The sinuses are clear.
IMPRESSION: No evidence of metallic foreign body within the orbits.

## 2018-06-26 IMAGING — MR MR LUMBAR SPINE W/O CM
5 series · 40 of 48 positions shown · non-contrast
Comparison: CT Abdomen and Pelvis 05/18/2016, lumbar radiographs
05/04/2016.

CLINICAL DATA: 75-year-old female with 4 weeks of lumbar back pain
radiating to the left leg. Bilateral leg weakness. No known injury.
Subsequent encounter.

EXAM:
MRI LUMBAR SPINE WITHOUT CONTRAST
TECHNIQUE: Multiplanar, multisequence MR imaging of the lumbar spine was
performed. No intravenous contrast was administered.

[Series 4: T2 · sagittal · 4.0mm · 0.94mm/px · 6 of 13 slices shown (1 of 2)]
[im 1/13]
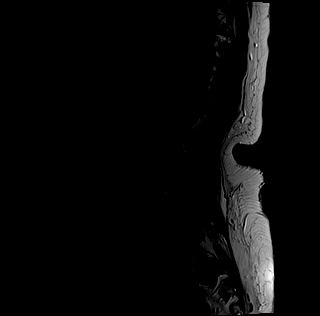
[im 3/13]
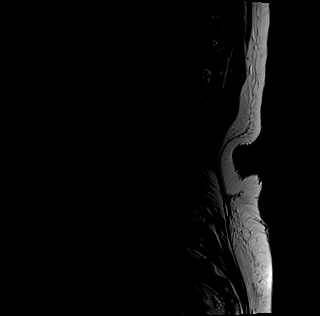
[im 5/13]
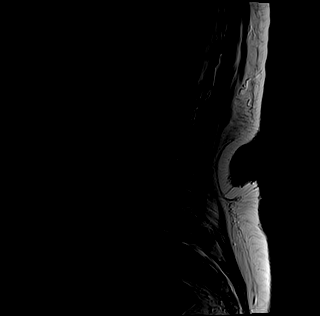
[im 8/13]
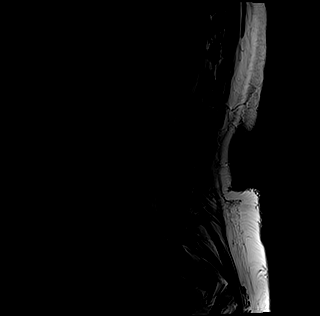
[im 10/13]
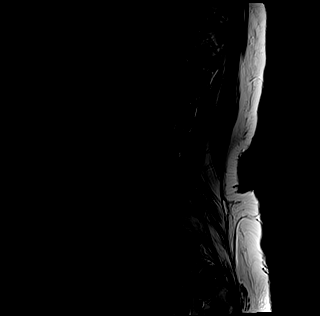
[im 13/13]
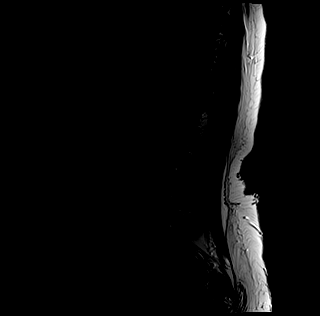

[Series 5: T1 · sagittal · 4.0mm · 0.94mm/px · 6 of 13 slices shown (1 of 2)]
[im 1/13]
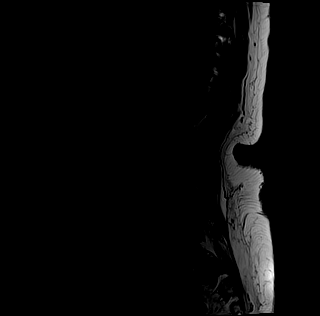
[im 3/13]
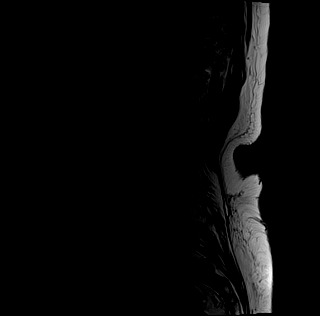
[im 5/13]
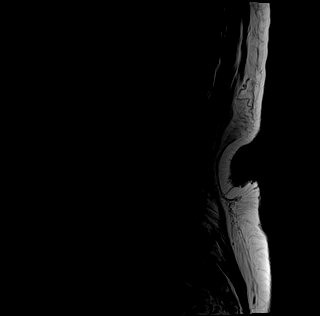
[im 8/13]
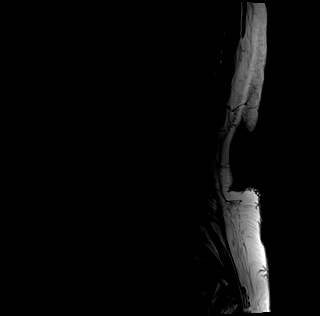
[im 10/13]
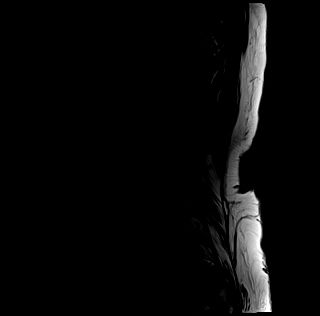
[im 13/13]
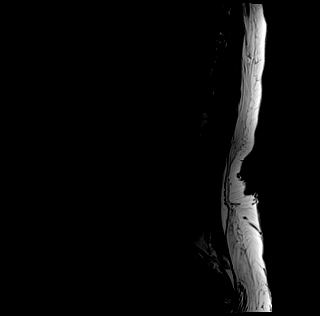

[Series 6: tirm sag · sagittal · 4.0mm · 0.59mm/px · 6 of 13 slices shown]
[im 1/13]
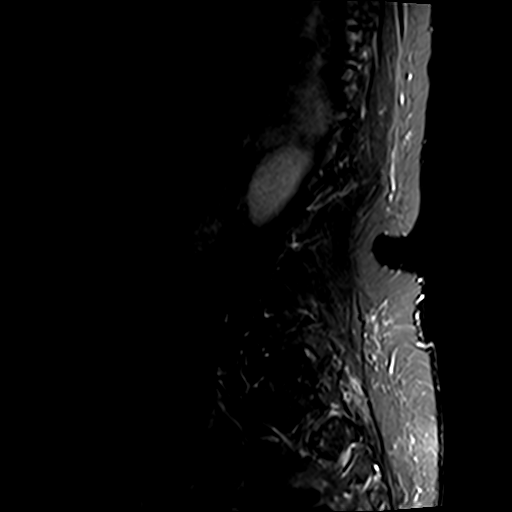
[im 3/13]
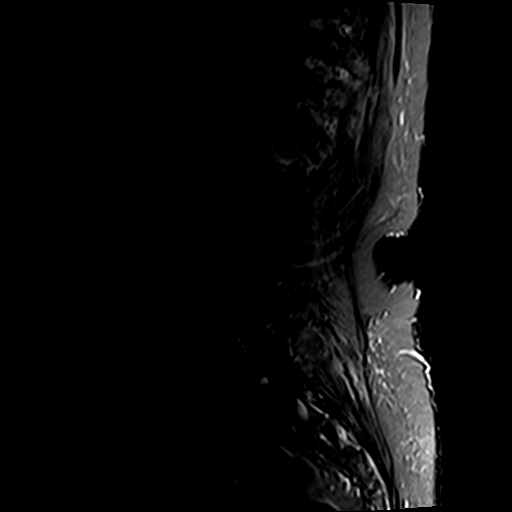
[im 5/13]
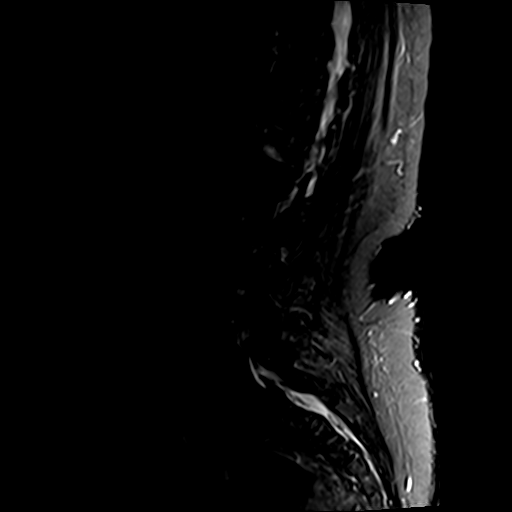
[im 8/13]
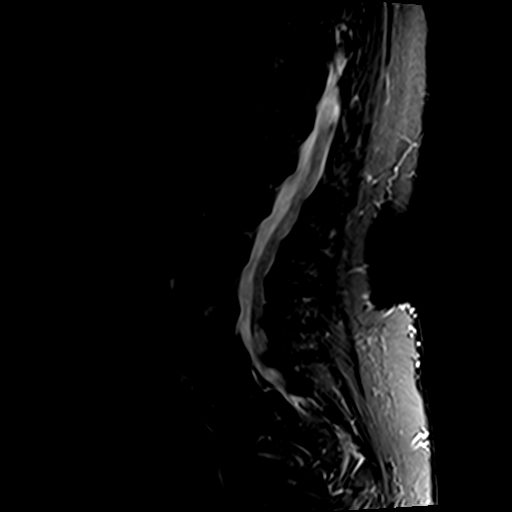
[im 10/13]
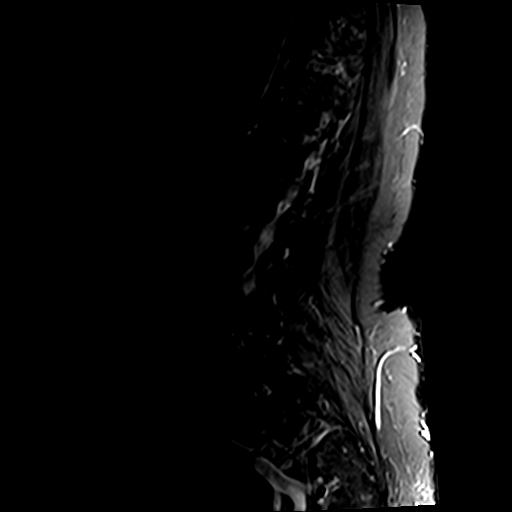
[im 13/13]
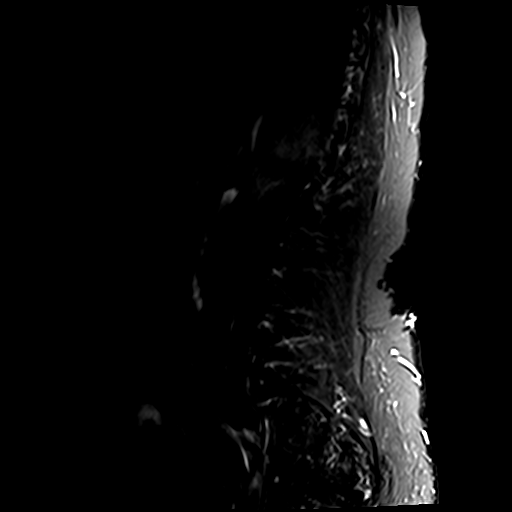

[Series 7: T1 · axial · 4.0mm · 0.78mm/px · z∈[-92,+133]mm · 9 of 30 slices shown (2 of 2)]
[im 1/30]
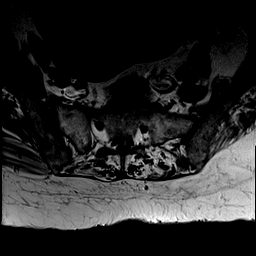
[im 5/30]
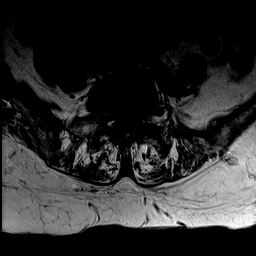
[im 9/30]
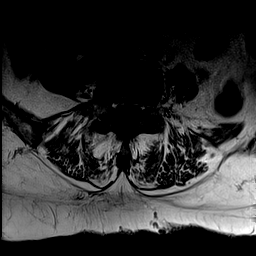
[im 13/30]
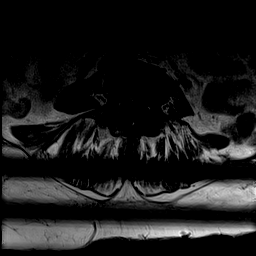
[im 15/30]
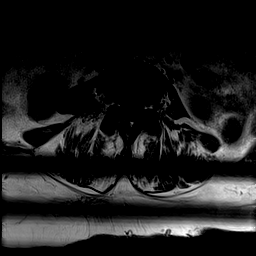
[im 17/30]
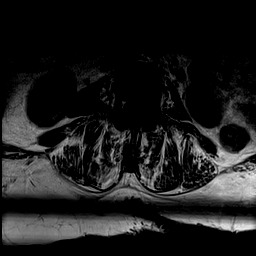
[im 21/30]
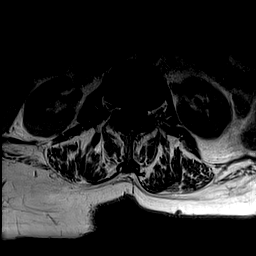
[im 25/30]
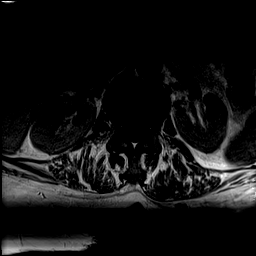
[im 30/30]
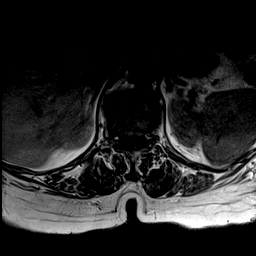

[Series 8: T2 · axial · 4.0mm · 0.78mm/px · z∈[-92,+133]mm · 13 of 30 slices shown (2 of 2)]
[im 1/30]
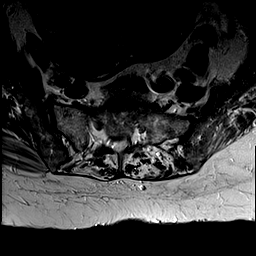
[im 3/30]
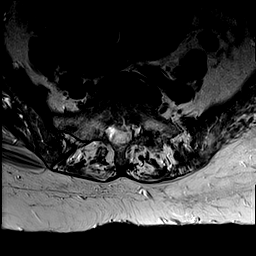
[im 5/30]
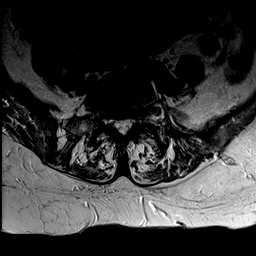
[im 7/30]
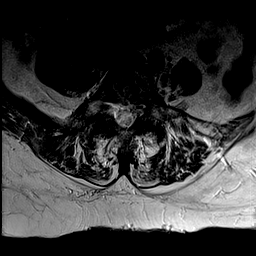
[im 9/30]
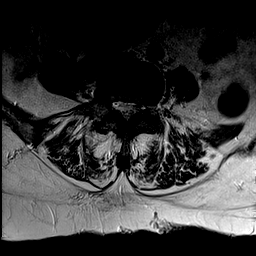
[im 11/30]
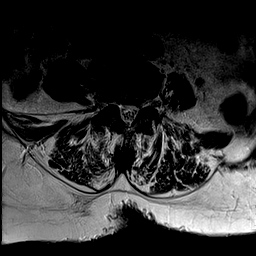
[im 13/30]
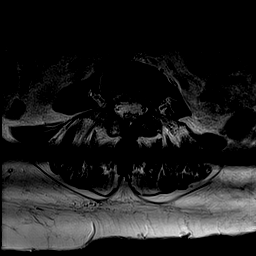
[im 15/30]
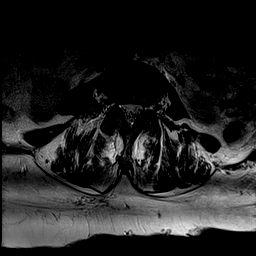
[im 17/30]
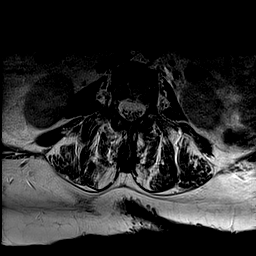
[im 19/30]
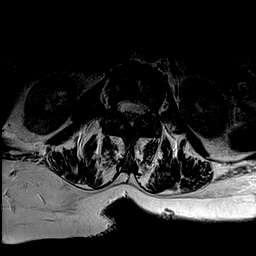
[im 21/30]
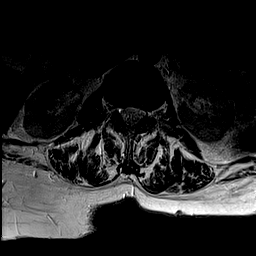
[im 25/30]
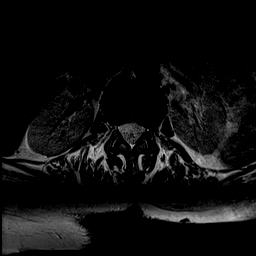
[im 30/30]
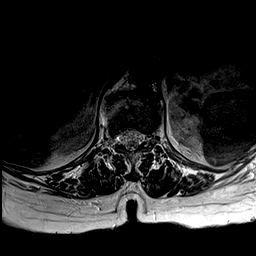

[40 of 48 positions shown; findings below may reference images not displayed]

FINDINGS: Segmentation:  Normal as demonstrated on the CT Abdomen and Pelvis.

Alignment: Grade 1 anterolisthesis of L5 on S1 is stable with no
associated pars fracture. Mildly exaggerated lumbar lordosis is
superimposed. Stable vertebral height and alignment from the
[place] comparisons.

Vertebrae: Heterogeneous bone marrow signal including numerous small
areas of STIR hyperintensity throughout the visible spine and sacrum
(Series 6, image 7). No areas of confluent marrow edema or
destructive osseous lesion identified. Spinal bone mineralization on
CT was within normal limits.

Conus medullaris: Extends to the T12-L1 level and appears normal.

Paraspinal and other soft tissues: Partially re- demonstrated bulky
fibroid uterus (series 8, image 30). Cholelithiasis again noted,
with multiple gallstones individually up to 14 mm. Otherwise
negative.

Disc levels:

T9-T10: Mild disc bulge.

T10-T11:  Negative.

T11-T12:  Negative.

T12-L1: Mild retrolisthesis and circumferential disc bulge. Moderate
facet hypertrophy. No spinal stenosis. Up to mild T12 foraminal
stenosis.

L1-L2: Mild retrolisthesis and circumferential disc bulge. No
stenosis.

L2-L3: Mild circumferential disc bulge. Mild facet hypertrophy. No
stenosis.

L3-L4: Mild mostly far lateral disc bulging. Mild to moderate facet
and ligament flavum hypertrophy. Mild bilateral L3 foraminal
stenosis.

L4-L5: Circumferential disc bulge eccentric to the right.
Broad-based posterior component. Moderate facet and ligament flavum
hypertrophy. Mild spinal stenosis (series 4, image 7) with left
greater than right lateral recess stenosis. There is bilateral mild
to moderate L4 foraminal stenosis.

L5-S1: Grade 1 anterolisthesis. Posterior disc space loss.
Circumferential disc bulge and endplate spurring. Severe bilateral
facet hypertrophy. Still, there is no significant spinal stenosis.
No convincing lateral recess stenosis. Only mild bilateral L5
foraminal stenosis.
IMPRESSION: 1. Nonspecific heterogeneous bone marrow signal throughout the
visible spine and pelvis. While this might be benign, the appearance
on STIR imaging is most suspicious and could be seen in the setting
of early multiple myeloma. Therefore recommend follow-up SPEP and
UPEP.
2. Fairly age congruent lumbar spine degeneration except for grade 1
spondylolisthesis at L5-S1 where there is associated severe facet
arthropathy, but only mild bilateral neural foraminal stenosis at
L5-S1.
3. However, at L4-L5 there mild multifactorial spinal and lateral
recess stenosis at L4-L5 which does appear greater on the left - and
might be a source for left L5 radiculitis - and up to moderate
bilateral neural foraminal stenosis at that level.
4. Elsewhere only mild foraminal stenosis.
5. Bulky fibroid uterus.  Cholelithiasis.

## 2018-08-13 ENCOUNTER — Telehealth (INDEPENDENT_AMBULATORY_CARE_PROVIDER_SITE_OTHER): Payer: Self-pay | Admitting: *Deleted

## 2018-08-13 NOTE — Telephone Encounter (Signed)
Pt son (sam listed on DPR) states that he would like to cancel appt and will call back to r/s.

## 2018-08-14 ENCOUNTER — Ambulatory Visit (INDEPENDENT_AMBULATORY_CARE_PROVIDER_SITE_OTHER): Payer: Medicaid Other | Admitting: Orthopedic Surgery

## 2019-12-24 ENCOUNTER — Ambulatory Visit: Payer: Medicaid Other | Admitting: Physical Therapy

## 2020-06-14 ENCOUNTER — Ambulatory Visit: Payer: Medicaid Other | Admitting: Orthopedic Surgery

## 2020-07-14 ENCOUNTER — Ambulatory Visit: Payer: Medicaid Other | Admitting: Orthopedic Surgery

## 2020-07-14 ENCOUNTER — Other Ambulatory Visit: Payer: Self-pay

## 2020-07-23 ENCOUNTER — Ambulatory Visit: Payer: Medicaid Other | Admitting: Surgical

## 2020-07-29 ENCOUNTER — Ambulatory Visit: Payer: Medicaid Other | Admitting: Orthopedic Surgery

## 2020-09-08 ENCOUNTER — Ambulatory Visit (INDEPENDENT_AMBULATORY_CARE_PROVIDER_SITE_OTHER): Payer: Medicaid Other

## 2020-09-08 ENCOUNTER — Ambulatory Visit (INDEPENDENT_AMBULATORY_CARE_PROVIDER_SITE_OTHER): Payer: Medicaid Other | Admitting: Orthopedic Surgery

## 2020-09-08 ENCOUNTER — Other Ambulatory Visit: Payer: Self-pay

## 2020-09-08 ENCOUNTER — Ambulatory Visit: Payer: Self-pay

## 2020-09-08 DIAGNOSIS — M79601 Pain in right arm: Secondary | ICD-10-CM

## 2020-09-08 DIAGNOSIS — M79602 Pain in left arm: Secondary | ICD-10-CM

## 2020-09-09 ENCOUNTER — Telehealth: Payer: Self-pay

## 2020-09-09 DIAGNOSIS — M79602 Pain in left arm: Secondary | ICD-10-CM

## 2020-09-09 DIAGNOSIS — M79601 Pain in right arm: Secondary | ICD-10-CM

## 2020-09-09 NOTE — Addendum Note (Signed)
Addended byPrescott Parma on: 09/09/2020 11:32 AM   Modules accepted: Orders

## 2020-09-09 NOTE — Telephone Encounter (Signed)
S/w patients son Doreatha Martin and advised. He is agreeable to out patient. I have put in order for this.

## 2020-09-09 NOTE — Telephone Encounter (Signed)
Leslie Ball called about HHPT referral.  Patient has Medicaid and will only be allowed 1 HHPT visit due to insurance. I know they were wanting HHPT but this will not be covered under insurance. Do you want me to refer her to outpatient?

## 2020-09-09 NOTE — Telephone Encounter (Signed)
Sure but they are going to say Sam can take her so I would say that her insurance does not allow it and if they want her to go to therapy Sams and have to take her thanks

## 2020-09-12 ENCOUNTER — Encounter: Payer: Self-pay | Admitting: Orthopedic Surgery

## 2020-09-12 NOTE — Progress Notes (Signed)
Office Visit Note   Patient: Leslie Ball           Date of Birth: March 24, 1941           MRN: [id number] Visit Date: 09/08/2020 Requested by: Fleet Contras, MD [address] [place],  [place] [address] PCP: Fleet Contras, MD  Subjective: Chief Complaint  Patient presents with  . Other    Bilateral shoulder/neck/elbow/hand pain    HPI: Patient presents for evaluation of bilateral shoulder and elbow pain right worse than left.  Reports that the pain wakes her from sleep at night.  She also reports some neck pain.  Also bilateral elbow pain with some numbness and tingling both hands.  Describes decreased grip strength in the right upper extremity.  She is ambulating with a walker.  She is had prior hip and knee replacements.  She describes decreased grip strength since coronavirus.  Symptoms have been now worse for about 3 to 4 weeks.  She does Sudo type exercises with her arms as much she is able to.  Remarkably she still lives on her own.  She is here with her son today Sam              ROS: All systems reviewed are negative as they relate to the chief complaint within the history of present illness.  Patient denies  fevers or chills.   Assessment & Plan: Visit Diagnoses:  1. 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  Diminished range of motion but deltoid is functional.  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  In general she is becoming quite arthritic and is losing function in her upper extremities.  The neck itself is not excessively arthritic.  She does not really want any injections.  Therapy may or may not be helpful but in general I think the more she moves the shoulders on her own the better.  They are quite stiff.  We will try therapy for both shoulders for range of motion and strengthening.  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  We will see if that helps.  I think an injection into the shoulders this arthritic would only give days of relief and not weeks.  Follow-up as needed.  Follow-Up Instructions: No follow-ups on file.   Orders:  Orders Placed This Encounter  Procedures  . XR Cervical Spine 2 or 3 views  . XR Shoulder Right  . XR Shoulder Left   No orders of the defined types were placed in this encounter.     Procedures: No procedures performed   Clinical Data: No additional findings.  Objective: Vital Signs: There were no vitals taken for this visit.  Physical Exam:   Constitutional: Patient appears well-developed HEENT:  Head: Normocephalic Eyes:EOM are normal Neck: Normal range of motion Cardiovascular: Normal rate Pulmonary/chest: Effort normal Neurologic: Patient is alert Skin: Skin is warm Psychiatric: Patient has normal mood and affect    Ortho Exam: Ortho exam demonstrates diminished bilateral shoulder range of motion passively bilaterally 15/30/50/.  Bilateral elbow range of motion is 25-1 15 bilaterally.  She has diminished supination on the right compared to the left.  Grip strength also slightly weaker on the right than the left but there is no muscle atrophy present.  Radial pulses intact bilaterally.  Deltoid does fire bilaterally.  Neck range of motion is within  2 inches chin to chest extension about 30 degrees rotation is about 40 degrees bilaterally.  Specialty Comments:  No specialty comments available.  Imaging: No results found.   PMFS History: Patient Active Problem List   Diagnosis Date Noted  . Hip arthritis 07/28/2016  . Primary osteoarthritis of left hip 06/15/2016  . Nausea with vomiting 07/21/2013  . Unspecified constipation 07/21/2013  . Abdominal pain, epigastric 07/21/2013  . Arthritis of knee 07/15/2013  . Arthritis of left knee 04/15/2013   Past Medical History:  Diagnosis Date  . Anemia   . Arthritis    "left knee; probably  left shoulder & elbow" (07/15/2013)  . Asthma   . 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 Constipation    occasionally  . GERD (gastroesophageal reflux disease)    rare use of Pepto bismol  . Hypertension   . Neuromuscular disorder (HCC)    right hand has "pins and needles" feeling at times  . RA (rheumatoid arthritis) (HCC)     No family history on file.  Past Surgical History:  Procedure Laterality Date  . APPLICATION OF WOUND VAC Right 09/06/2017   Procedure: APPLICATION OF WOUND VAC;  Surgeon: Cammy Copa, MD;  Location: [facility] OR;  Service: Orthopedics;  Laterality: Right;  . CATARACT EXTRACTION W/ INTRAOCULAR LENS IMPLANT Bilateral   . EYE SURGERY Right   . JOINT REPLACEMENT    . REFRACTIVE SURGERY Bilateral   . RETINAL DETACHMENT SURGERY Right   . TOTAL HIP ARTHROPLASTY Left 07/28/2016   Procedure: LEFT ANTERIOR TOTAL HIP ARTHROPLASTY;  Surgeon: Cammy Copa, MD;  Location: [facility] OR;  Service: Orthopedics;  Laterality: Left;  . TOTAL HIP ARTHROPLASTY Right 09/06/2017   Procedure: RIGHT TOTAL HIP ARTHROPLASTY ANTERIOR APPROACH;  Surgeon: Cammy Copa, MD;  Location: [facility] OR;  Service: Orthopedics;  Laterality: Right;  . TOTAL KNEE ARTHROPLASTY Right 04/15/2013   Procedure: TOTAL KNEE ARTHROPLASTY;  Surgeon: Cammy Copa, MD;  Location: [facility] OR;  Service: Orthopedics;  Laterality: Right;  . TOTAL KNEE ARTHROPLASTY Left 07/15/2013  . TOTAL KNEE ARTHROPLASTY Left 07/15/2013   Procedure: TOTAL KNEE ARTHROPLASTY- left;  Surgeon: Cammy Copa, MD;  Location: [facility] OR;  Service: Orthopedics;  Laterality: Left;  . TOTAL KNEE ARTHROPLASTY Left 07/28/2016   Social History   Occupational History  . Not on file  Tobacco Use  . Smoking status: Never Smoker  . Smokeless tobacco: Never Used  Substance and Sexual Activity  . Alcohol use: No  . Drug use: No  . Sexual activity: Not on file

## 2020-09-20 ENCOUNTER — Telehealth: Payer: Self-pay

## 2020-09-20 NOTE — Telephone Encounter (Signed)
Patients son Doreatha Martin called he would like a call back regarding a injection call back:[phone]

## 2020-09-21 NOTE — Telephone Encounter (Signed)
I called. Need to discuss with Dr August Saucer when he returns to clinic Wednesday a.m.

## 2020-09-23 NOTE — Telephone Encounter (Signed)
Can you please help me with this.  Patient is wanting to have a gel injection in her shoulder. However, Dr August Saucer would like her to have it done under fluoro with Dr Alvester Morin but also the patient would like the cheapest route for gel injection so I am assuming that would be a series? Also would need I guess to include 3 OV with NE? They are wanting a ballpark price.

## 2020-09-27 NOTE — Telephone Encounter (Signed)
So Hyalgan, Supartx FX and Visco-3 bill out at $375.  I would say use Hyalgan.   The 20610 and 86754 can bill out to her insurance.  Those bill out at $275 and $385 respectively.   I would need to know when she is having these, so I can adjust her charges to not bill out to ins for the medication.   So I would tell her $1125 OOP (Hyalgan) and I would ask her to pay this up front since we are doing it this way.   The 20610 and 49201 will bill out and we would accept whatever Medicaid reimburses for them.   I hope that helps clarify/explain, if not let me know.

## 2020-09-27 NOTE — Telephone Encounter (Signed)
Will call and discuss with patients son.

## 2020-09-29 NOTE — Telephone Encounter (Signed)
IC discussed. Advised of below. He will discuss with patient and call me back to confirm how they wish to proceed.

## 2020-09-30 ENCOUNTER — Ambulatory Visit: Payer: Medicaid Other

## 2020-10-04 ENCOUNTER — Telehealth: Payer: Self-pay | Admitting: Orthopedic Surgery

## 2020-10-04 NOTE — Telephone Encounter (Signed)
Patient's son Doreatha Martin called requesting a call back from [employer]. He states it is concerning patient's shoulder. Please call Sam at [phone].

## 2020-10-06 NOTE — Telephone Encounter (Signed)
IC and discussed. He would like for patients PCP to see if they can get approval.  I discussed with Dr August Saucer and he is ok with this even though highly likely will not get this approved.  If approved will likely need to [facility] and have delivered to patients home. I will call back and further discuss with Sam.

## 2020-10-08 NOTE — Telephone Encounter (Signed)
I have submitted information to PCP per Dr Alfonso Patten request even though not likely they will get approved for shoulders.

## 2020-10-13 ENCOUNTER — Telehealth: Payer: Self-pay

## 2020-10-13 NOTE — Telephone Encounter (Signed)
Patients son sam called regarding previous conversation you and the patient had I advised patient you are out of the office this week he is requesting a call back:[phone]

## 2020-10-26 NOTE — Telephone Encounter (Signed)
IC and spoke with Sam. He wanted to know status of [place] sending information to patients PCP about getting gel injections. I advised submitted information to them on 05/20.  He said they had not received it so I called PCP office and LM with triage nurse who was going to pass message to the head nurse and Dr Concepcion Elk. She said that someone would give [place] a call back about this.

## 2020-12-15 ENCOUNTER — Other Ambulatory Visit: Payer: Self-pay | Admitting: Internal Medicine

## 2020-12-16 LAB — TSH: TSH: 2.57 mIU/L (ref 0.40–4.50)

## 2020-12-16 LAB — COMPLETE METABOLIC PANEL WITH GFR
AG Ratio: 0.8 (calc) — ABNORMAL LOW (ref 1.0–2.5)
ALT: 10 U/L (ref 6–29)
AST: 13 U/L (ref 10–35)
Albumin: 3.5 g/dL — ABNORMAL LOW (ref 3.6–5.1)
Alkaline phosphatase (APISO): 92 U/L (ref 37–153)
BUN/Creatinine Ratio: 31 (calc) — ABNORMAL HIGH (ref 6–22)
BUN: 29 mg/dL — ABNORMAL HIGH (ref 7–25)
CO2: 23 mmol/L (ref 20–32)
Calcium: 9 mg/dL (ref 8.6–10.4)
Chloride: 101 mmol/L (ref 98–110)
Creat: 0.95 mg/dL (ref 0.60–1.00)
Globulin: 4.6 g/dL (calc) — ABNORMAL HIGH (ref 1.9–3.7)
Glucose, Bld: 94 mg/dL (ref 65–99)
Potassium: 4.3 mmol/L (ref 3.5–5.3)
Sodium: 137 mmol/L (ref 135–146)
Total Bilirubin: 0.3 mg/dL (ref 0.2–1.2)
Total Protein: 8.1 g/dL (ref 6.1–8.1)
eGFR: 61 mL/min/{1.73_m2} (ref >=60)

## 2020-12-16 LAB — CBC
HCT: 34 % — ABNORMAL LOW (ref 35.0–45.0)
Hemoglobin: 10.3 g/dL — ABNORMAL LOW (ref 11.7–15.5)
MCH: 24.7 pg — ABNORMAL LOW (ref 27.0–33.0)
MCHC: 30.3 g/dL — ABNORMAL LOW (ref 32.0–36.0)
MCV: 81.5 fL (ref 80.0–100.0)
MPV: 8.8 fL (ref 7.5–12.5)
Platelets: 387 10*3/uL (ref 140–400)
RBC: 4.17 10*6/uL (ref 3.80–5.10)
RDW: 17.8 % — ABNORMAL HIGH (ref 11.0–15.0)
WBC: 7.4 10*3/uL (ref 3.8–10.8)

## 2020-12-16 LAB — LIPID PANEL
Cholesterol: 163 mg/dL (ref <200)
HDL: 53 mg/dL (ref >=50)
LDL Cholesterol (Calc): 90 mg/dL (calc)
Non-HDL Cholesterol (Calc): 110 mg/dL (calc) (ref <130)
Total CHOL/HDL Ratio: 3.1 (calc) (ref <5.0)
Triglycerides: 106 mg/dL (ref <150)

## 2021-09-28 ENCOUNTER — Other Ambulatory Visit: Payer: Self-pay | Admitting: Internal Medicine

## 2021-09-29 LAB — CBC
HCT: 36.1 % (ref 35.0–45.0)
Hemoglobin: 11 g/dL — ABNORMAL LOW (ref 11.7–15.5)
MCH: 24.8 pg — ABNORMAL LOW (ref 27.0–33.0)
MCHC: 30.5 g/dL — ABNORMAL LOW (ref 32.0–36.0)
MCV: 81.5 fL (ref 80.0–100.0)
MPV: 8.7 fL (ref 7.5–12.5)
Platelets: 420 10*3/uL — ABNORMAL HIGH (ref 140–400)
RBC: 4.43 10*6/uL (ref 3.80–5.10)
RDW: 16.7 % — ABNORMAL HIGH (ref 11.0–15.0)
WBC: 6.2 10*3/uL (ref 3.8–10.8)

## 2021-09-29 LAB — COMPLETE METABOLIC PANEL WITH GFR
AG Ratio: 0.8 (calc) — ABNORMAL LOW (ref 1.0–2.5)
ALT: 7 U/L (ref 6–29)
AST: 11 U/L (ref 10–35)
Albumin: 3.5 g/dL — ABNORMAL LOW (ref 3.6–5.1)
Alkaline phosphatase (APISO): 84 U/L (ref 37–153)
BUN: 21 mg/dL (ref 7–25)
CO2: 24 mmol/L (ref 20–32)
Calcium: 8.6 mg/dL (ref 8.6–10.4)
Chloride: 102 mmol/L (ref 98–110)
Creat: 0.9 mg/dL (ref 0.60–0.95)
Globulin: 4.6 g/dL (calc) — ABNORMAL HIGH (ref 1.9–3.7)
Glucose, Bld: 89 mg/dL (ref 65–99)
Potassium: 4.2 mmol/L (ref 3.5–5.3)
Sodium: 139 mmol/L (ref 135–146)
Total Bilirubin: 0.3 mg/dL (ref 0.2–1.2)
Total Protein: 8.1 g/dL (ref 6.1–8.1)
eGFR: 65 mL/min/{1.73_m2} (ref >=60)

## 2021-09-29 LAB — LIPID PANEL
Cholesterol: 170 mg/dL (ref <200)
HDL: 51 mg/dL (ref >=50)
LDL Cholesterol (Calc): 100 mg/dL (calc) — ABNORMAL HIGH
Non-HDL Cholesterol (Calc): 119 mg/dL (calc) (ref <130)
Total CHOL/HDL Ratio: 3.3 (calc) (ref <5.0)
Triglycerides: 101 mg/dL (ref <150)

## 2021-09-29 LAB — TSH: TSH: 2.31 mIU/L (ref 0.40–4.50)

## 2022-10-23 ENCOUNTER — Other Ambulatory Visit: Payer: Self-pay | Admitting: Internal Medicine

## 2022-10-25 LAB — COMPLETE METABOLIC PANEL WITH GFR
AG Ratio: 0.8 (calc) — ABNORMAL LOW (ref 1.0–2.5)
ALT: 10 U/L (ref 6–29)
AST: 14 U/L (ref 10–35)
Albumin: 3.6 g/dL (ref 3.6–5.1)
Alkaline phosphatase (APISO): 85 U/L (ref 37–153)
BUN: 19 mg/dL (ref 7–25)
CO2: 24 mmol/L (ref 20–32)
Calcium: 9 mg/dL (ref 8.6–10.4)
Chloride: 104 mmol/L (ref 98–110)
Creat: 0.88 mg/dL (ref 0.60–0.95)
Globulin: 4.4 g/dL (calc) — ABNORMAL HIGH (ref 1.9–3.7)
Glucose, Bld: 84 mg/dL (ref 65–99)
Potassium: 4.5 mmol/L (ref 3.5–5.3)
Sodium: 138 mmol/L (ref 135–146)
Total Bilirubin: 0.3 mg/dL (ref 0.2–1.2)
Total Protein: 8 g/dL (ref 6.1–8.1)
eGFR: 66 mL/min/{1.73_m2} (ref >=60)

## 2022-10-25 LAB — TSH: TSH: 1.84 mIU/L (ref 0.40–4.50)

## 2022-10-25 LAB — LIPID PANEL
Cholesterol: 177 mg/dL (ref <200)
HDL: 56 mg/dL (ref >=50)
LDL Cholesterol (Calc): 104 mg/dL (calc) — ABNORMAL HIGH
Non-HDL Cholesterol (Calc): 121 mg/dL (calc) (ref <130)
Total CHOL/HDL Ratio: 3.2 (calc) (ref <5.0)
Triglycerides: 83 mg/dL (ref <150)

## 2022-10-25 LAB — URINE CULTURE
MICRO NUMBER:: 15033029
SPECIMEN QUALITY:: ADEQUATE

## 2022-10-25 LAB — CBC
HCT: 36 % (ref 35.0–45.0)
Hemoglobin: 10.9 g/dL — ABNORMAL LOW (ref 11.7–15.5)
MCH: 25 pg — ABNORMAL LOW (ref 27.0–33.0)
MCHC: 30.3 g/dL — ABNORMAL LOW (ref 32.0–36.0)
MCV: 82.6 fL (ref 80.0–100.0)
MPV: 8.9 fL (ref 7.5–12.5)
Platelets: 391 10*3/uL (ref 140–400)
RBC: 4.36 10*6/uL (ref 3.80–5.10)
RDW: 16 % — ABNORMAL HIGH (ref 11.0–15.0)
WBC: 6 10*3/uL (ref 3.8–10.8)

## 2024-01-22 ENCOUNTER — Other Ambulatory Visit: Payer: Self-pay

## 2024-01-22 ENCOUNTER — Emergency Department (HOSPITAL_COMMUNITY)

## 2024-01-22 ENCOUNTER — Encounter (HOSPITAL_COMMUNITY): Payer: Self-pay

## 2024-01-22 ENCOUNTER — Inpatient Hospital Stay (HOSPITAL_COMMUNITY)
Admission: EM | Admit: 2024-01-22 | Discharge: 2024-01-27 | DRG: 561 | Disposition: A | Discharge status: Home / Self Care | Attending: Internal Medicine | Admitting: Internal Medicine

## 2024-01-22 DIAGNOSIS — H5461 Unqualified visual loss, right eye, normal vision left eye: Secondary | ICD-10-CM | POA: Diagnosis present

## 2024-01-22 DIAGNOSIS — S72002A Fracture of unspecified part of neck of left femur, initial encounter for closed fracture: Secondary | ICD-10-CM | POA: Diagnosis not present

## 2024-01-22 DIAGNOSIS — M069 Rheumatoid arthritis, unspecified: Secondary | ICD-10-CM | POA: Diagnosis present

## 2024-01-22 DIAGNOSIS — M25562 Pain in left knee: Secondary | ICD-10-CM | POA: Diagnosis present

## 2024-01-22 DIAGNOSIS — I10 Essential (primary) hypertension: Secondary | ICD-10-CM | POA: Diagnosis present

## 2024-01-22 DIAGNOSIS — Z961 Presence of intraocular lens: Secondary | ICD-10-CM | POA: Diagnosis present

## 2024-01-22 DIAGNOSIS — Z79899 Other long term (current) drug therapy: Secondary | ICD-10-CM | POA: Diagnosis not present

## 2024-01-22 DIAGNOSIS — R9431 Abnormal electrocardiogram [ECG] [EKG]: Secondary | ICD-10-CM | POA: Diagnosis not present

## 2024-01-22 DIAGNOSIS — M9702XA Periprosthetic fracture around internal prosthetic left hip joint, initial encounter: Principal | ICD-10-CM | POA: Diagnosis present

## 2024-01-22 DIAGNOSIS — G894 Chronic pain syndrome: Secondary | ICD-10-CM | POA: Diagnosis present

## 2024-01-22 DIAGNOSIS — Z9841 Cataract extraction status, right eye: Secondary | ICD-10-CM | POA: Diagnosis not present

## 2024-01-22 DIAGNOSIS — Z7901 Long term (current) use of anticoagulants: Secondary | ICD-10-CM

## 2024-01-22 DIAGNOSIS — M1990 Unspecified osteoarthritis, unspecified site: Secondary | ICD-10-CM | POA: Insufficient documentation

## 2024-01-22 DIAGNOSIS — X58XXXA Exposure to other specified factors, initial encounter: Secondary | ICD-10-CM | POA: Diagnosis present

## 2024-01-22 DIAGNOSIS — Z96642 Presence of left artificial hip joint: Secondary | ICD-10-CM | POA: Diagnosis not present

## 2024-01-22 DIAGNOSIS — E669 Obesity, unspecified: Secondary | ICD-10-CM | POA: Diagnosis present

## 2024-01-22 DIAGNOSIS — Z7951 Long term (current) use of inhaled steroids: Secondary | ICD-10-CM

## 2024-01-22 DIAGNOSIS — Z96643 Presence of artificial hip joint, bilateral: Secondary | ICD-10-CM | POA: Diagnosis present

## 2024-01-22 DIAGNOSIS — H409 Unspecified glaucoma: Secondary | ICD-10-CM | POA: Diagnosis present

## 2024-01-22 DIAGNOSIS — M25561 Pain in right knee: Secondary | ICD-10-CM | POA: Diagnosis present

## 2024-01-22 DIAGNOSIS — S32402A Unspecified fracture of left acetabulum, initial encounter for closed fracture: Principal | ICD-10-CM

## 2024-01-22 DIAGNOSIS — Z8719 Personal history of other diseases of the digestive system: Secondary | ICD-10-CM

## 2024-01-22 DIAGNOSIS — Z6836 Body mass index (BMI) 36.0-36.9, adult: Secondary | ICD-10-CM | POA: Diagnosis not present

## 2024-01-22 DIAGNOSIS — K219 Gastro-esophageal reflux disease without esophagitis: Secondary | ICD-10-CM | POA: Diagnosis present

## 2024-01-22 DIAGNOSIS — M25552 Pain in left hip: Secondary | ICD-10-CM | POA: Diagnosis present

## 2024-01-22 DIAGNOSIS — Z9842 Cataract extraction status, left eye: Secondary | ICD-10-CM

## 2024-01-22 DIAGNOSIS — Z96653 Presence of artificial knee joint, bilateral: Secondary | ICD-10-CM | POA: Diagnosis present

## 2024-01-22 LAB — CBC WITH DIFFERENTIAL/PLATELET
Abs Immature Granulocytes: 0.02 K/uL (ref 0.00–0.07)
Basophils Absolute: 0.1 K/uL (ref 0.0–0.1)
Basophils Relative: 1 %
Eosinophils Absolute: 0.3 K/uL (ref 0.0–0.5)
Eosinophils Relative: 4 %
HCT: 37.7 % (ref 36.0–46.0)
Hemoglobin: 11.5 g/dL — ABNORMAL LOW (ref 12.0–15.0)
Immature Granulocytes: 0 %
Lymphocytes Relative: 31 %
Lymphs Abs: 2 K/uL (ref 0.7–4.0)
MCH: 27 pg (ref 26.0–34.0)
MCHC: 30.5 g/dL (ref 30.0–36.0)
MCV: 88.5 fL (ref 80.0–100.0)
Monocytes Absolute: 0.7 K/uL (ref 0.1–1.0)
Monocytes Relative: 11 %
Neutro Abs: 3.4 K/uL (ref 1.7–7.7)
Neutrophils Relative %: 53 %
Platelets: 309 K/uL (ref 150–400)
RBC: 4.26 MIL/uL (ref 3.87–5.11)
RDW: 16.6 % — ABNORMAL HIGH (ref 11.5–15.5)
WBC: 6.4 K/uL (ref 4.0–10.5)
nRBC: 0 % (ref 0.0–0.2)

## 2024-01-22 LAB — BASIC METABOLIC PANEL WITH GFR
Anion gap: 11 (ref 5–15)
BUN: 23 mg/dL (ref 8–23)
CO2: 25 mmol/L (ref 22–32)
Calcium: 9 mg/dL (ref 8.9–10.3)
Chloride: 104 mmol/L (ref 98–111)
Creatinine, Ser: 0.9 mg/dL (ref 0.44–1.00)
GFR, Estimated: >60 mL/min (ref >60)
Glucose, Bld: 100 mg/dL — ABNORMAL HIGH (ref 70–99)
Potassium: 4.2 mmol/L (ref 3.5–5.1)
Sodium: 139 mmol/L (ref 135–145)

## 2024-01-22 LAB — TYPE AND SCREEN
ABO/RH(D): O POS
Antibody Screen: NEGATIVE

## 2024-01-22 LAB — SEDIMENTATION RATE: Sed Rate: 84 mm/h — ABNORMAL HIGH (ref 0–22)

## 2024-01-22 LAB — HEPATIC FUNCTION PANEL
ALT: 9 U/L (ref 0–44)
AST: 18 U/L (ref 15–41)
Albumin: 3.7 g/dL (ref 3.5–5.0)
Alkaline Phosphatase: 74 U/L (ref 38–126)
Bilirubin, Direct: 0.1 mg/dL (ref 0.0–0.2)
Indirect Bilirubin: 0.3 mg/dL (ref 0.3–0.9)
Total Bilirubin: 0.4 mg/dL (ref 0.0–1.2)
Total Protein: 7.3 g/dL (ref 6.5–8.1)

## 2024-01-22 MED ORDER — ALBUTEROL SULFATE (2.5 MG/3ML) 0.083% IN NEBU
2.5000 mg | INHALATION_SOLUTION | Freq: Four times a day (QID) | RESPIRATORY_TRACT | Status: DC | PRN
  Administered 2024-01-24 – 2024-01-25 (×3): 2.5 mg via RESPIRATORY_TRACT
  Filled 2024-01-22 (×3): qty 3

## 2024-01-22 MED ORDER — ONDANSETRON HCL 4 MG PO TABS
4.0000 mg | ORAL_TABLET | Freq: Four times a day (QID) | ORAL | Status: DC | PRN
  Administered 2024-01-24: 4 mg via ORAL
  Filled 2024-01-22: qty 1

## 2024-01-22 MED ORDER — SODIUM CHLORIDE 0.9% FLUSH
3.0000 mL | Freq: Two times a day (BID) | INTRAVENOUS | Status: DC
  Administered 2024-01-23 – 2024-01-26 (×4): 3 mL via INTRAVENOUS

## 2024-01-22 MED ORDER — MORPHINE SULFATE (PF) 4 MG/ML IV SOLN
4.0000 mg | Freq: Once | INTRAVENOUS | Status: AC
  Administered 2024-01-22: 4 mg via INTRAVENOUS
  Filled 2024-01-22: qty 1

## 2024-01-22 MED ORDER — METHOCARBAMOL 500 MG PO TABS
500.0000 mg | ORAL_TABLET | Freq: Three times a day (TID) | ORAL | Status: DC | PRN
  Administered 2024-01-24 – 2024-01-25 (×2): 500 mg via ORAL
  Filled 2024-01-22 (×3): qty 1

## 2024-01-22 MED ORDER — HYDROMORPHONE HCL 1 MG/ML IJ SOLN
0.5000 mg | INTRAMUSCULAR | Status: DC | PRN
  Administered 2024-01-24 – 2024-01-25 (×3): 0.5 mg via INTRAVENOUS
  Administered 2024-01-26 – 2024-01-27 (×3): 1 mg via INTRAVENOUS
  Filled 2024-01-22 (×7): qty 1

## 2024-01-22 MED ORDER — SODIUM CHLORIDE 0.9 % IV SOLN: 250.0000 mL | INTRAVENOUS | Status: AC | PRN

## 2024-01-22 MED ORDER — HYDROCODONE-ACETAMINOPHEN 5-325 MG PO TABS
1.0000 | ORAL_TABLET | Freq: Four times a day (QID) | ORAL | Status: DC | PRN
  Administered 2024-01-23 – 2024-01-27 (×11): 1 via ORAL
  Filled 2024-01-22 (×12): qty 1

## 2024-01-22 MED ORDER — ACETAMINOPHEN 10 MG/ML IV SOLN
1000.0000 mg | Freq: Once | INTRAVENOUS | Status: AC
  Administered 2024-01-23: 1000 mg via INTRAVENOUS
  Filled 2024-01-22: qty 100

## 2024-01-22 MED ORDER — GABAPENTIN 100 MG PO CAPS
100.0000 mg | ORAL_CAPSULE | Freq: Three times a day (TID) | ORAL | Status: DC
  Administered 2024-01-22 – 2024-01-27 (×14): 100 mg via ORAL
  Filled 2024-01-22 (×15): qty 1

## 2024-01-22 MED ORDER — MORPHINE SULFATE (PF) 2 MG/ML IV SOLN
2.0000 mg | Freq: Once | INTRAVENOUS | Status: AC
  Administered 2024-01-22: 2 mg via INTRAVENOUS
  Filled 2024-01-22: qty 1

## 2024-01-22 MED ORDER — ACETAMINOPHEN 325 MG PO TABS: 650.0000 mg | ORAL_TABLET | Freq: Four times a day (QID) | ORAL | Status: DC | PRN

## 2024-01-22 MED ORDER — ONDANSETRON HCL 4 MG/2ML IJ SOLN
4.0000 mg | Freq: Four times a day (QID) | INTRAMUSCULAR | Status: DC | PRN
  Administered 2024-01-23: 4 mg via INTRAVENOUS
  Filled 2024-01-22: qty 2

## 2024-01-22 MED ORDER — HYDROCODONE-ACETAMINOPHEN 5-325 MG PO TABS
1.0000 | ORAL_TABLET | Freq: Once | ORAL | Status: AC
  Administered 2024-01-22: 1 via ORAL
  Filled 2024-01-22: qty 1

## 2024-01-22 MED ORDER — ACETAMINOPHEN 650 MG RE SUPP: 650.0000 mg | Freq: Four times a day (QID) | RECTAL | Status: DC | PRN

## 2024-01-22 MED ORDER — DEXTROSE IN LACTATED RINGERS 5 % IV SOLN: INTRAVENOUS | Status: AC

## 2024-01-22 MED ORDER — SODIUM CHLORIDE 0.9% FLUSH: 3.0000 mL | INTRAVENOUS | Status: DC | PRN

## 2024-01-22 NOTE — ED Provider Notes (Signed)
 [place] EMERGENCY DEPARTMENT AT [facility] Provider Note   CSN: [id number] Arrival date & time: 01/22/24  1254     Patient presents with: Hip Pain   Leslie Ball is a 83 y.o. female with history of hypertension, arthritis, glaucoma presents emerged from today for evaluation of left hip pain.  History obtained of the patient and son due to language barrier.  Patient is able to speak some English and her son is able to supplement.  She lives by herself independently and is ambulatory with walker.  Was at her baseline health on Friday.  She called her son on Saturday saying that she could not stand up because of her left hip was hurting her.  She has not had a fall in over 3 months.  She denies any numbness or tingling.  Is also in pain in her bilateral knees.  She has not been able to ambulate since then has been laying in her bed with feeling of her taking care of her.  She was seen at her PCP today and was told to go over to the emergency department.  She has had bilateral hips and bilateral knee replacements.   Hip Pain       Prior to Admission medications   Medication Sig Start Date End Date Taking? Authorizing Provider  brinzolamide  (AZOPT ) 1 % ophthalmic suspension Place 1 drop into the left eye 3 (three) times daily.     [provider]  ENSURE (ENSURE) Take 237 mLs by mouth daily.    [provider]  Fluticasone -Salmeterol (ADVAIR) 250-50 MCG/DOSE AEPB Inhale 1 puff into the lungs 2 (two) times daily as needed (for shortness of breath).     [provider]  gabapentin  (NEURONTIN ) 100 MG capsule Take 100 mg by mouth 2 (two) times daily.    [provider]  hydrochlorothiazide  (MICROZIDE ) 12.5 MG capsule Take 12.5 mg by mouth daily.     [provider]  HYDROcodone -acetaminophen  (NORCO/VICODIN) 5-325 MG tablet Take 2 tablets by mouth every 6 (six) hours as needed for moderate pain. 09/10/17   Addie Cordella Hamilton, MD   latanoprost  (XALATAN ) 0.005 % ophthalmic solution Place 1 drop into the left eye at bedtime. 09/10/17   Addie Cordella Hamilton, MD  methocarbamol  (ROBAXIN ) 500 MG tablet Take 1 tablet (500 mg total) by mouth every 8 (eight) hours as needed for muscle spasms. 09/10/17   Addie Cordella Hamilton, MD  metoprolol  succinate (TOPROL -XL) 25 MG 24 hr tablet Take 25 mg by mouth daily. 07/17/16   [provider]  rivaroxaban  (XARELTO ) 10 MG TABS tablet Take 1 tablet (10 mg total) by mouth daily with breakfast. 09/11/17   Addie Cordella Hamilton, MD  theophylline  (THEODUR) 300 MG 12 hr tablet Take 150 mg by mouth at bedtime.    [provider]  traMADol  (ULTRAM ) 50 MG tablet Take 1 tablet (50 mg total) by mouth every 12 (twelve) hours as needed (FOR PAIN.). Patient taking differently: Take 50 mg by mouth 3 (three) times daily as needed (pain).  07/31/16   Addie Cordella Hamilton, MD  zolpidem  (AMBIEN ) 5 MG tablet Take 1 tablet (5 mg total) by mouth at bedtime as needed for sleep. 09/10/17   Addie Cordella Hamilton, MD    Allergies: Oxycodone  and Chlorhexidine     Review of Systems  Updated Vital Signs BP (!) 156/78 (BP Location: Left Arm)   Pulse 62   Temp 97.9 F (36.6 C) (Oral)   Resp 20   Ht  5' 2 (1.575 m)   Wt 70 kg   SpO2 96%   BMI 28.23 kg/m   Physical Exam Vitals and nursing note reviewed.  Constitutional:      Appearance: She is not toxic-appearing.     Comments: Uncomfortable, but nontoxic-appearing  Cardiovascular:     Rate and Rhythm: Normal rate.  Pulmonary:     Effort: Pulmonary effort is normal. No respiratory distress.  Abdominal:     Palpations: Abdomen is soft.     Tenderness: There is no abdominal tenderness. There is no guarding or rebound.  Musculoskeletal:        General: Tenderness present.     Comments: Old faint surgical scars present to the anterior bilateral knees.  Has diffuse anterior tenderness and tenderness into her femurs as well into her left hip but no  tenderness to the right hip.  She has palpable DP and PT pulses present bilaterally.  Compartments are soft.  No unilateral leg swelling.  She has varicosities present throughout the legs.  Legs appear symmetric in size, coloration and feel symmetric in temperature.  Sensation reportedly intact and symmetric per patient.  She has more tenderness to the anterior of the left hip.  Skin:    General: Skin is warm and dry.  Neurological:     Mental Status: She is alert.     (all labs ordered are listed, but only abnormal results are displayed) Labs Reviewed  CBC WITH DIFFERENTIAL/PLATELET  BASIC METABOLIC PANEL WITH GFR    EKG: None  Radiology: DG Hip Unilat W or Wo Pelvis 2-3 Views Left Result Date: 01/22/2024 CLINICAL DATA:  Left hip pain. EXAM: DG HIP (WITH OR WITHOUT PELVIS) 2-3V LEFT COMPARISON:  Pelvic radiograph dated 09/14/2016. FINDINGS: Bilateral total hip arthroplasties. There is fracture of a screw along the superior aspect of the left acetabular cup. The acetabular cup appears slightly rotated. There is protrusion of the acetabular cup through the inner wall of the acetabulum. There is elevation of the inner acetabular cortex versus small fracture fragments. The bones are osteopenic. Calcified density over the pelvis likely fibroids. IMPRESSION: 1. Fracture of a screw along the superior aspect of the left acetabular cup. 2. Slight rotation and protrusion of the acetabular cup through the inner wall of the acetabulum. Orthopedic consult is advised. Electronically Signed   By: Vanetta Chou M.D.   On: 01/22/2024 14:42    Procedures   Medications Ordered in the ED  HYDROcodone -acetaminophen  (NORCO/VICODIN) 5-325 MG per tablet 1 tablet (1 tablet Oral Given 01/22/24 1431)    Medical Decision Making Amount and/or Complexity of Data Reviewed Labs: ordered. Radiology: ordered.  Risk Prescription drug management.   83 y.o. female presents to the ER for evaluation of left hip pain  and bilateral knee pains. Differential diagnosis includes but is not limited to arthritis, sprain, strain, chronic pain, fracture, dislocation. Vital signs elevated blood pressure otherwise unremarkable. Physical exam as noted above.   Patient had a left knee replacement in 2018 by Dr. Addie.  She denies any falls.  X-ray obtained from triage.  XR shows 1. Fracture of a screw along the superior aspect of the left acetabular cup. 2. Slight rotation and protrusion of the acetabular cup through the inner wall of the acetabulum. Orthopedic consult is advised. Per radiologist's interpretation.    Patient has seen Dr. Addie previously, I have consulted [facility] and awaiting a call back.  Given fracture and need for admission, have ordered basic labs.  I independently reviewed  and interpreted the patient's labs. CBC mild anemia, otherwise unremarkable. BMP pending.  3:07 PM Care of [facility] [facility]  transferred to [facility] at the end of my shift as the patient will require reassessment once labs/imaging have resulted. Patient presentation, ED course, and plan of care discussed with review of all pertinent labs and imaging. Please see his/her note for further details regarding further ED course and disposition. Plan at time of handoff is follow up with ortho, labs, admission. This may be altered or completely changed at the discretion of the oncoming team pending results of further workup.  4:04 PM Spoke with E[place] with [facility]  4:09 PM Spoke with Dr. Georgina. He is going to talk to his colleagues about surgery availability and where to transfer the patient. Requests CT to be done. He will communicate with oncoming team about plan.   Portions of this report may have been transcribed using voice recognition software. Every effort was made to ensure accuracy; however, inadvertent computerized transcription errors may be present.    Final diagnoses:  None    ED Discharge Orders     None           Bernis Ernst, [place] 01/22/24 1618    Freddi Hamilton, MD 01/23/24 [phone]

## 2024-01-22 NOTE — ED Triage Notes (Signed)
 Patient BIB [facility] from doctors office. PCP said to go to ED due to uncontrolled left hip pain. Has chronic arthritis. Since Saturday patient has been unable to bear weight, usually ambulates with a walker. Denies recent falls.

## 2024-01-22 NOTE — H&P (Signed)
 History and Physical    Leslie Ball FMW:[id number] DOB: 10-Oct-1940 DOA: 01/22/2024  PCP: Shelda Atlas, MD   Patient coming from: Home   Chief Complaint:  Chief Complaint  Patient presents with   Hip Pain   ED TRIAGE note:  Patient BIB [facility] from doctors office. PCP said to go to ED due to uncontrolled left hip pain. Has chronic arthritis. Since Saturday patient has been unable to bear weight, usually ambulates with a walker. Denies recent falls.       HPI:  Leslie Ball is a 83 y.o. 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  Patient is speak [place] and understand however not very fluent in [place].   Patient reported that she took 2 Tylenol  and some pain medications yesterday still continues to have left-sided hip joint pain.  Denies any fall.  Also reported that she develops rash sometime and takes some medication for rash.  Patient denies any chest pain, palpitation, headache, and abdominal pain.  Per chart review of the discussion between ED physician and the patient's son- she lives by herself independently and is ambulatory with walker. Was at her baseline health on Friday. She called her son on Saturday saying that she could not stand up because of her left hip was hurting her. She has not had a fall in over 3 months.  Due to significant pain patient able to bear weight.  Denies any tingling, numbness, and sensory change.  Patient also complaining about bilateral knee joint pain.  Due to debilitating pain she has been lying in her bed with feeling of  her taking care of her. She was seen at her PCP today and was told to go over to the emergency department.   Patient was on Xarelto  for the time being as a DVT prophylaxis after previous hip and knee joint replacement.  No previous history of pulmonary embolism or DVT.  Verified with patient son not taking Xarelto  anymore.  ED Course:  At presentation to ED patient is hemodynamically stable.  Afebrile. Lab work, unremarkable normal WBC count and stable H&H normal platelet count. BMP unremarkable. Pending ESR and CRP.  CT scan of the pelvis showed: 1. Left hip total arthroplasty with medial and superior displacement of the acetabular cup and comminuted fracture of the medial and posterior aspect of the acetabulum. 2. Subacute/chronic appearing fracture of the left ischium. 3. Fracture of the most superior screw in the left acetabular cup. 4. Enlarged fibroid uterus.  X-ray of the hip showedFracture of a screw along the superior aspect of the left acetabular cup. 2. Slight rotation and protrusion of the acetabular cup through the inner wall of the acetabulum. Orthopedic consult is advised  X-ray of the bilateral knee uncomplicated total knee joint prosthesis.  In the ED patient received hydrocodone  and morphine .  ED physician discussed case with on-call orthopedics Dr. Dr. Georgina who said that he had talked to Dr. Addie who was still in discussions with Dr. Patria and Dr. Vernetta as to who might do the surgery.  Recommended keep n.p.o. after midnight and admit to [facility].  Hospitalist has been consulted for further evaluation management of pain control and left-sided hip  joint fracture.   Significant labs in the ED: Lab Orders         CBC with Differential         Basic metabolic panel         Sedimentation rate         C-reactive protein         CBC         Protime-INR         Comprehensive metabolic panel         Hepatic function panel       Review of Systems:  Review of  Systems  Constitutional:  Negative for chills, fever, malaise/fatigue and weight loss.  Respiratory:  Negative for cough, sputum production and shortness of breath.   Cardiovascular:  Negative for chest pain and palpitations.  Gastrointestinal:  Negative for heartburn and nausea.  Musculoskeletal:  Positive for joint pain. Negative for falls and myalgias.       Bilateral shoulder pain, bilateral knee joint pain and left-sided hip joint pain which is more severe.  Neurological:  Negative for dizziness and headaches.  Psychiatric/Behavioral:  The patient is not nervous/anxious.     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     Constipation    occasionally   GERD (gastroesophageal reflux disease)    rare use of Pepto bismol   Hypertension    Neuromuscular disorder (HCC)    right hand has pins and needles feeling at times   RA (rheumatoid arthritis) [facility])     Past Surgical History:  Procedure Laterality Date   APPLICATION OF WOUND VAC Right 09/06/2017   Procedure: APPLICATION OF WOUND VAC;  Surgeon: Addie Cordella Hamilton, MD;  Location: [facility] OR;  Service: Orthopedics;  Laterality: Right;   CATARACT EXTRACTION W/ INTRAOCULAR LENS IMPLANT Bilateral    EYE SURGERY Right    JOINT REPLACEMENT     REFRACTIVE SURGERY Bilateral    RETINAL DETACHMENT SURGERY Right    TOTAL HIP ARTHROPLASTY Left 07/28/2016   Procedure: LEFT ANTERIOR TOTAL HIP ARTHROPLASTY;  Surgeon: Hamilton Cordella Addie, MD;  Location: [facility] OR;  Service: Orthopedics;  Laterality: Left;   TOTAL HIP ARTHROPLASTY Right 09/06/2017   Procedure: RIGHT TOTAL HIP ARTHROPLASTY ANTERIOR APPROACH;  Surgeon: Addie Cordella Hamilton, MD;  Location: [facility] OR;  Service: Orthopedics;  Laterality: Right;   TOTAL KNEE ARTHROPLASTY Right 04/15/2013   Procedure:  TOTAL KNEE ARTHROPLASTY;  Surgeon: Cordella Hamilton Addie, MD;  Location: [facility] OR;  Service: Orthopedics;  Laterality: Right;   TOTAL KNEE ARTHROPLASTY Left 07/15/2013   TOTAL KNEE ARTHROPLASTY Left 07/15/2013   Procedure: TOTAL KNEE ARTHROPLASTY- left;  Surgeon: Cordella Hamilton Addie, MD;  Location[facility] OR;  Service: Orthopedics;  Laterality: Left;   TOTAL KNEE ARTHROPLASTY Left 07/28/2016     reports that she has never smoked. She has never used smokeless tobacco. She reports that she does not drink alcohol and does not use drugs.  Allergies  Allergen Reactions   Oxycodone  Itching   Chlorhexidine  Itching    REACTION: redness    History reviewed. No pertinent family history.  Prior to Admission medications   Medication Sig Start Date End Date Taking? Authorizing Provider  brinzolamide  (AZOPT ) 1 % ophthalmic suspension Place 1 drop  into the left eye 3 (three) times daily.     [provider]  ENSURE (ENSURE) Take 237 mLs by mouth daily.    [provider]  Fluticasone -Salmeterol (ADVAIR) 250-50 MCG/DOSE AEPB Inhale 1 puff into the lungs 2 (two) times daily as needed (for shortness of breath).     [provider]  gabapentin  (NEURONTIN ) 100 MG capsule Take 100 mg by mouth 2 (two) times daily.    [provider]  hydrochlorothiazide  (MICROZIDE ) 12.5 MG capsule Take 12.5 mg by mouth daily.     [provider]  HYDROcodone -acetaminophen  (NORCO/VICODIN) 5-325 MG tablet Take 2 tablets by mouth every 6 (six) hours as needed for moderate pain. 09/10/17   Addie Cordella Hamilton, MD  latanoprost  (XALATAN ) 0.005 % ophthalmic solution Place 1 drop into the left eye at bedtime. 09/10/17   Addie Cordella Hamilton, MD  methocarbamol  (ROBAXIN ) 500 MG tablet Take 1 tablet (500 mg total) by mouth every 8 (eight) hours as needed for muscle spasms. 09/10/17   Addie Cordella Hamilton, MD  metoprolol  succinate (TOPROL -XL) 25 MG 24 hr tablet Take 25 mg by mouth daily. 07/17/16   [provider]  rivaroxaban  (XARELTO ) 10 MG TABS tablet Take 1 tablet (10 mg total) by mouth daily with breakfast. 09/11/17   Addie Cordella Hamilton, MD  theophylline  (THEODUR) 300 MG 12 hr tablet Take 150 mg by mouth at bedtime.    [provider]  traMADol  (ULTRAM ) 50 MG tablet Take 1 tablet (50 mg total) by mouth every 12 (twelve) hours as needed (FOR PAIN.). Patient taking differently: Take 50 mg by mouth 3 (three) times daily as needed (pain).  07/31/16   Addie Cordella Hamilton, MD  zolpidem  (AMBIEN ) 5 MG tablet Take 1 tablet (5 mg total) by mouth at bedtime as needed for sleep. 09/10/17   Addie Cordella Hamilton, MD     Physical Exam: Vitals:   01/22/24 1514 01/22/24 1717 01/22/24 2000 01/22/24 2054  BP: (!) 150/73 128/83 (!) 151/57   Pulse: 73 65 62   Resp: 18 18 17    Temp:  97.7 F (36.5 C)  97.7 F (36.5 C)  TempSrc:  Oral  Oral  SpO2: 99% 95% 98%   Weight:      Height:        Physical Exam Vitals and nursing note reviewed.  Constitutional:      General: She is not in acute distress.    Appearance: Normal appearance. She is not ill-appearing.  HENT:     Mouth/Throat:     Mouth: Mucous membranes are moist.  Eyes:     Pupils: Pupils are equal, round, and reactive to light.  Cardiovascular:     Rate and Rhythm: Normal rate and regular rhythm.     Pulses: Normal pulses.     Heart sounds: Normal heart sounds.  Pulmonary:     Effort: Pulmonary effort is normal.     Breath sounds: Normal breath sounds.  Abdominal:     Palpations: Abdomen is soft.  Musculoskeletal:        General: Tenderness present. No swelling, deformity or signs of injury.     Cervical back: Neck supple.     Right lower leg: No edema.     Left lower leg: No edema.     Comments: Left-sided hip joint pain on passive movement  Skin:    General: Skin is warm.     Capillary Refill: Capillary refill takes less than 2 seconds.  Neurological:  Mental Status: She is alert and oriented to person, place,  and time.  Psychiatric:        Mood and Affect: Mood normal.      Labs on Admission: I have personally reviewed following labs and imaging studies  CBC: Recent Labs  Lab 01/22/24 1441  WBC 6.4  NEUTROABS 3.4  HGB 11.5*  HCT 37.7  MCV 88.5  PLT 309   Basic Metabolic Panel: Recent Labs  Lab 01/22/24 1441  NA 139  K 4.2  CL 104  CO2 25  GLUCOSE 100*  BUN 23  CREATININE 0.90  CALCIUM 9.0   GFR: Estimated Creatinine Clearance: 44.2 mL/min (by C-G formula based on SCr of 0.9 mg/dL). Liver Function Tests: No results for input(s): AST, ALT, ALKPHOS, BILITOT, PROT, ALBUMIN  in the last 168 hours. No results for input(s): LIPASE, AMYLASE in the last 168 hours. No results for input(s): AMMONIA in the last 168 hours. Coagulation Profile: No results for input(s): INR, PROTIME in the last 168 hours. Cardiac Enzymes: No results for input(s): CKTOTAL, CKMB, CKMBINDEX, TROPONINI, TROPONINIHS in the last 168 hours. BNP (last 3 results) No results for input(s): BNP in the last 8760 hours. HbA1C: No results for input(s): HGBA1C in the last 72 hours. CBG: No results for input(s): GLUCAP in the last 168 hours. Lipid Profile: No results for input(s): CHOL, HDL, LDLCALC, TRIG, CHOLHDL, LDLDIRECT in the last 72 hours. Thyroid Function Tests: No results for input(s): TSH, T4TOTAL, FREET4, T3FREE, THYROIDAB in the last 72 hours. Anemia Panel: No results for input(s): VITAMINB12, FOLATE, FERRITIN, TIBC, IRON, RETICCTPCT in the last 72 hours. Urine analysis:    Component Value Date/Time   COLORURINE YELLOW 08/27/2017 1301   APPEARANCEUR HAZY (A) 08/27/2017 1301   LABSPEC 1.018 08/27/2017 1301   PHURINE 6.0 08/27/2017 1301   GLUCOSEU NEGATIVE 08/27/2017 1301   HGBUR SMALL (A) 08/27/2017 1301   BILIRUBINUR NEGATIVE 08/27/2017 1301   KETONESUR NEGATIVE 08/27/2017 1301   PROTEINUR NEGATIVE 08/27/2017 1301    UROBILINOGEN 1.0 07/21/2013 1625   NITRITE NEGATIVE 08/27/2017 1301   LEUKOCYTESUR SMALL (A) 08/27/2017 1301    Radiological Exams on Admission: I have personally reviewed images CT PELVIS WO CONTRAST Result Date: 01/22/2024 CLINICAL DATA:  Hip trauma, fracture suspected.  Left hip pain. EXAM: CT PELVIS WITHOUT CONTRAST TECHNIQUE: Multidetector CT imaging of the pelvis was performed following the standard protocol without intravenous contrast. RADIATION DOSE REDUCTION: This exam was performed according to the departmental dose-optimization program which includes automated exposure control, adjustment of the mA and/or kV according to patient size and/or use of iterative reconstruction technique. COMPARISON:  Left hip x-ray 01/22/2024. left hip x-ray 10/01/2017. FINDINGS: Urinary Tract: Bladder is not well seen secondary to streak artifact in the pelvis. Bowel:  Unremarkable visualized pelvic bowel loops. Vascular/Lymphatic: No pathologically enlarged lymph nodes. No significant vascular abnormality seen. Reproductive: Uterus is enlarged and lobulated measuring 14 0.5 by 14 cm. Multiple fibroids are present including a heavily calcified left uterine fibroid measuring 6.7 cm. Other:  There is no free fluid or focal abdominal wall hernia. Musculoskeletal: The bones are diffusely osteopenic. A left hip total arthroplasty is present. The acetabular cup is displaced medially 2 cm and superiorly 3 cm with comminuted fracture of the medial and posterior aspect of the acetabulum. An oblique fracture seen through the ischium which appears subacute/chronic. There is no gross dislocation. Additionally, the most superior screw in the left acetabular cup appears fractured. Right hip arthroplasty appears uncomplicated. IMPRESSION: 1. Left hip  total arthroplasty with medial and superior displacement of the acetabular cup and comminuted fracture of the medial and posterior aspect of the acetabulum. 2. Subacute/chronic appearing  fracture of the left ischium. 3. Fracture of the most superior screw in the left acetabular cup. 4. Enlarged fibroid uterus. Electronically Signed   By: Greig Pique M.D.   On: 01/22/2024 19:21   DG Knee 2 Views Left Result Date: 01/22/2024 CLINICAL DATA:  Pain EXAM: LEFT KNEE - 1-2 VIEW; RIGHT KNEE - 1-2 VIEW COMPARISON:  LEFT knee radiographs 05/04/2016 FINDINGS: LEFT knee: LEFT total knee prosthesis is well seated without periprosthetic fracture or lucency. No acute osseous abnormality. Soft tissues are normal. RIGHT knee: RIGHT total knee prosthesis is well seated without periprosthetic fracture or lucency. No acute osseous abnormality. Soft tissues are normal. IMPRESSION: 1. Uncomplicated BILATERAL total knee prostheses. 2. No acute abnormality of the knees. Electronically Signed   By: Aliene Lloyd M.D.   On: 01/22/2024 16:59   DG Knee 2 Views Right Result Date: 01/22/2024 CLINICAL DATA:  Pain EXAM: LEFT KNEE - 1-2 VIEW; RIGHT KNEE - 1-2 VIEW COMPARISON:  LEFT knee radiographs 05/04/2016 FINDINGS: LEFT knee: LEFT total knee prosthesis is well seated without periprosthetic fracture or lucency. No acute osseous abnormality. Soft tissues are normal. RIGHT knee: RIGHT total knee prosthesis is well seated without periprosthetic fracture or lucency. No acute osseous abnormality. Soft tissues are normal. IMPRESSION: 1. Uncomplicated BILATERAL total knee prostheses. 2. No acute abnormality of the knees. Electronically Signed   By: Aliene Lloyd M.D.   On: 01/22/2024 16:59   DG Hip Unilat W or Wo Pelvis 2-3 Views Left Result Date: 01/22/2024 CLINICAL DATA:  Left hip pain. EXAM: DG HIP (WITH OR WITHOUT PELVIS) 2-3V LEFT COMPARISON:  Pelvic radiograph dated 09/14/2016. FINDINGS: Bilateral total hip arthroplasties. There is fracture of a screw along the superior aspect of the left acetabular cup. The acetabular cup appears slightly rotated. There is protrusion of the acetabular cup through the inner wall of the  acetabulum. There is elevation of the inner acetabular cortex versus small fracture fragments. The bones are osteopenic. Calcified density over the pelvis likely fibroids. IMPRESSION: 1. Fracture of a screw along the superior aspect of the left acetabular cup. 2. Slight rotation and protrusion of the acetabular cup through the inner wall of the acetabulum. Orthopedic consult is advised. Electronically Signed   By: Vanetta Chou M.D.   On: 01/22/2024 14:42     EKG: Pending EKG  Assessment/Plan: Principal Problem:   Closed left hip fracture, initial encounter [facility]) Active Problems:   History of total left hip arthroplasty   History of bilateral knee replacement   Chronic osteoarthritis   Essential hypertension   History of gastroesophageal reflux (GERD)   Closed left hip fracture (HCC)    Assessment and Plan: Left hip fracture History of left hip arthroplasty History of bilateral knee joint replacement History of chronic osteoarthritis History of chronic pain syndrome -Presented emergency department unbearable pain of the left sided hip joint and patient not getting out of the bed for last 1 day.  At the baseline patient has chronic pain however it has been progressively getting worse.  Patient denies any fall or trauma. - At presentation patient is hemodynamically stable - CBC showing stable H&H 11.5 and 37.7.  BMP unremarkable.  Pending ESR CRP. - X-ray of the hip showed Fracture of a screw along the superior aspect of the left  acetabular cup.  Slight rotation and protrusion of  the acetabular cup through the inner wall of the acetabulum.  -CT pelvis showed left hip total arthroplasty with medial and superior displacement of the acetabular and comminuted fracture of the medial and posterior aspect of the acetabulum.Subacute/chronic appearing fracture of the left ischium. . Fracture of the most superior screw in the left acetabular cup. -X-ray bilateral knee no evidence of fracture.   Uncomplicated bilateral knee prosthesis. -ED physician consulted and discussed case with Dr. Georgina who spoken with Dr. Addie and is still deciding either Dr. Patria or Dr. Vernetta will do the surgery tomorrow.  Dr. Georgina recommended keep patient n.p.o. after midnight and patient can stay at [facility] long. -Appreciate orthopedics input - Continue pain control-giving Tylenol  1000 mg one-time dose, continue Norco 5 mg every 6 hour as needed for moderate pain and Dilaudid  0.5 to 1 mg every 3 hour as needed for severe pain control.  Continue Robaxin  3 times daily as needed. - Keep patient n.p.o. and starting maintenance fluid D5 LR 75 cc/h after midnight. -Consult PT and OT postsurgery. -Continue SCD and TED hose.  Need to start pharmacological DVT prophylaxis postsurgery.    DVT prophylaxis:  SCDs.  Deferring pharmacologic DVT prophylaxis as plan for orthopedic intervention tomorrow.  Need to start afterward. Code Status:  Full Code Diet: Heart healthy diet.  N.p.o. after midnight Family Communication: Patient son available over phone. Opportunity was given to ask question and all questions were answered satisfactorily.  Disposition Plan: Continue to monitor improvement of pain control. Consults: Orthopedic surgeon Admission status:   Inpatient, Telemetry bed  Severity of Illness: The appropriate patient status for this patient is INPATIENT. Inpatient status is judged to be reasonable and necessary in order to provide the required intensity of service to ensure the patient's safety. The patient's presenting symptoms, physical exam findings, and initial radiographic and laboratory data in the context of their chronic comorbidities is felt to place them at high risk for further clinical deterioration. Furthermore, it is not anticipated that the patient will be medically stable for discharge from the hospital within 2 midnights of admission.   * I certify that at the point of admission it is my clinical  judgment that the patient will require inpatient hospital care spanning beyond 2 midnights from the point of admission due to high intensity of service, high risk for further deterioration and high frequency of surveillance required.DEWAINE    Denene Alamillo, MD [facility]  How to contact the [facility] Attending or Consulting provider 7A - 7P or covering provider during after hours 7P -7A, for this patient.  Check the care team in [facility] and look for a) attending/consulting [facility] provider listed and b) the [facility] team listed Log into [email or web address] and use 's universal password to access. If you do not have the password, please contact the hospital operator. Locate the [facility] provider you are looking for under [facility] and page to a number that you can be directly reached. If you still have difficulty reaching the provider, please page the [facility] (Director on Call) for the Hospitalists listed on amion for assistance.  01/22/2024, 10:05 PM

## 2024-01-22 NOTE — ED Provider Notes (Signed)
  Physical Exam  BP (!) 150/73 (BP Location: Left Arm)   Pulse 73   Temp 97.9 F (36.6 C) (Oral)   Resp 18   Ht 5' 2 (1.575 m)   Wt 70 kg   SpO2 99%   BMI 28.23 kg/m   Physical Exam Vitals and nursing note reviewed.  Constitutional:      General: She is not in acute distress.    Appearance: Normal appearance. She is not ill-appearing.  HENT:     Head: Normocephalic and atraumatic.  Eyes:     Extraocular Movements: Extraocular movements intact.     Conjunctiva/sclera: Conjunctivae normal.  Cardiovascular:     Rate and Rhythm: Normal rate.  Pulmonary:     Effort: Pulmonary effort is normal. No respiratory distress.  Skin:    General: Skin is warm and dry.  Neurological:     General: No focal deficit present.     Mental Status: She is alert. Mental status is at baseline.     Procedures  Procedures  ED Course / MDM   Clinical Course as of 01/22/24 2117  Tue Jan 22, 2024  1533 L hip pain. Both hips and knees replaced, Dr. Addie. Walking fine with walker. Could not walk since Saturday. Denies falls. Saw PCP and sent here.   XR noted fracture of screw and acetabulum abnormality.  Waiting on orthopedics consult.  Likely admission for pain control, cannot walk.  [CB]  1940 Talked with Dr. Georgina who said that he had talked to Dr. Addie who was still in discussions with Dr. Patria and Dr. Vernetta as to who might do the surgery.  Still undecided as to whether to admit to [facility] or [facility].  Dr. Georgina said that he would reach back out at this time to determine where to transfer her, regardless would be admitted to medicine.  Waiting on response from surgeons as to hospital to disposition the patient. [CB]  2102 Spoke with Dr. Georgina who had been speaking with Dr. Addie, reported that she is to be admitted to [facility] long and made n.p.o. at midnight in case and we can do the operation tomorrow.  Still pending as to who will actually do the surgery. [CB]    Clinical Course User  Index [CB] Beola Terrall RAMAN, PA-C   Medical Decision Making Amount and/or Complexity of Data Reviewed Labs: ordered. Radiology: ordered.  Risk Prescription drug management. Decision regarding hospitalization.  Patient care transferred over from [facility], PA-C.   Bernis spoke with Dr. Georgina who said he is going to speak with other surgeons Dr. Marvin or [place], after he scrubs out of case. Will message back to see if she will be admitted to [facility] or [facility].  Pending CT at this time.   Spoke with Dr. Georgina, still pending who is going to do the surgery however wishes to have her admitted to [facility], made n.p.o. at midnight.  Patient care then transferred to Dr. Karmen.         Beola Terrall RAMAN, PA-C 01/22/24 2118    Neysa Caron PARAS, DO 01/23/24 [phone]

## 2024-01-23 ENCOUNTER — Inpatient Hospital Stay (HOSPITAL_COMMUNITY)

## 2024-01-23 DIAGNOSIS — S72002A Fracture of unspecified part of neck of left femur, initial encounter for closed fracture: Secondary | ICD-10-CM | POA: Diagnosis not present

## 2024-01-23 DIAGNOSIS — R9431 Abnormal electrocardiogram [ECG] [EKG]: Secondary | ICD-10-CM

## 2024-01-23 LAB — ECHOCARDIOGRAM COMPLETE
Area-P 1/2: 3.19 cm2
Height: 62 in
MV M vel: 4.17 m/s
MV Peak grad: 69.6 mmHg
P 1/2 time: 728 ms
S' Lateral: 3.3 cm
Weight: 3227.53 [oz_av]

## 2024-01-23 LAB — COMPREHENSIVE METABOLIC PANEL WITH GFR
ALT: <5 U/L (ref 0–44)
AST: 13 U/L — ABNORMAL LOW (ref 15–41)
Albumin: 3.5 g/dL (ref 3.5–5.0)
Alkaline Phosphatase: 70 U/L (ref 38–126)
Anion gap: 9 (ref 5–15)
BUN: 21 mg/dL (ref 8–23)
CO2: 26 mmol/L (ref 22–32)
Calcium: 8.9 mg/dL (ref 8.9–10.3)
Chloride: 104 mmol/L (ref 98–111)
Creatinine, Ser: 0.81 mg/dL (ref 0.44–1.00)
GFR, Estimated: >60 mL/min (ref >60)
Glucose, Bld: 96 mg/dL (ref 70–99)
Potassium: 4.2 mmol/L (ref 3.5–5.1)
Sodium: 139 mmol/L (ref 135–145)
Total Bilirubin: 0.4 mg/dL (ref 0.0–1.2)
Total Protein: 7.3 g/dL (ref 6.5–8.1)

## 2024-01-23 LAB — CBC
HCT: 37.4 % (ref 36.0–46.0)
Hemoglobin: 11.5 g/dL — ABNORMAL LOW (ref 12.0–15.0)
MCH: 27.6 pg (ref 26.0–34.0)
MCHC: 30.7 g/dL (ref 30.0–36.0)
MCV: 89.9 fL (ref 80.0–100.0)
Platelets: 280 K/uL (ref 150–400)
RBC: 4.16 MIL/uL (ref 3.87–5.11)
RDW: 16.7 % — ABNORMAL HIGH (ref 11.5–15.5)
WBC: 4.7 K/uL (ref 4.0–10.5)
nRBC: 0 % (ref 0.0–0.2)

## 2024-01-23 LAB — C-REACTIVE PROTEIN: CRP: 7.4 mg/dL — ABNORMAL HIGH (ref <1.0)

## 2024-01-23 LAB — SURGICAL PCR SCREEN
MRSA, PCR: NEGATIVE
Staphylococcus aureus: NEGATIVE

## 2024-01-23 LAB — PROTIME-INR
INR: 1.2 (ref 0.8–1.2)
Prothrombin Time: 15.5 s — ABNORMAL HIGH (ref 11.4–15.2)

## 2024-01-23 MED ORDER — BRINZOLAMIDE 1 % OP SUSP: 1.0000 [drp] | Freq: Three times a day (TID) | OPHTHALMIC | Status: DC

## 2024-01-23 MED ORDER — HYDROXYZINE HCL 10 MG PO TABS
10.0000 mg | ORAL_TABLET | Freq: Three times a day (TID) | ORAL | Status: DC | PRN
  Filled 2024-01-23: qty 1

## 2024-01-23 MED ORDER — METOPROLOL SUCCINATE ER 25 MG PO TB24
25.0000 mg | ORAL_TABLET | Freq: Every day | ORAL | Status: DC
  Administered 2024-01-24 – 2024-01-27 (×4): 25 mg via ORAL
  Filled 2024-01-23 (×5): qty 1

## 2024-01-23 MED ORDER — SODIUM CHLORIDE 0.9 % IV SOLN: INTRAVENOUS | Status: DC

## 2024-01-23 MED ORDER — HYDRALAZINE HCL 20 MG/ML IJ SOLN
5.0000 mg | Freq: Four times a day (QID) | INTRAMUSCULAR | Status: DC | PRN
  Filled 2024-01-23: qty 1

## 2024-01-23 MED ORDER — LATANOPROST 0.005 % OP SOLN: 1.0000 [drp] | Freq: Every day | OPHTHALMIC | Status: DC

## 2024-01-23 MED ORDER — LIDOCAINE HCL (PF) 1 % IJ SOLN
5.0000 mL | Freq: Once | INTRAMUSCULAR | Status: AC
  Administered 2024-01-23: 5 mL

## 2024-01-23 MED ORDER — THEOPHYLLINE ER 300 MG PO TB12: 150.0000 mg | ORAL_TABLET | Freq: Every day | ORAL | Status: DC

## 2024-01-23 MED ORDER — LIDOCAINE HCL (PF) 1 % IJ SOLN
INTRAMUSCULAR | Status: AC
  Filled 2024-01-23: qty 5

## 2024-01-23 NOTE — Progress Notes (Signed)
 Pt admitted with acetabular fx left hip Total hip replacement about 8 years ago. Infection laboratory values slightly elevated This will be a complex reconstruction.   Will consult fellowship trained joint reconstruction surgeon for assistance Plan hip aspiration today to rule out infection This will be an exceedingly difficult event for this patient to overcome based on the complexity of this fracture.  And intervention required

## 2024-01-23 NOTE — Progress Notes (Addendum)
 PROGRESS NOTE    Leslie Ball  FMW:[id number] DOB: Jun 10, 1940 DOA: 01/22/2024 PCP: Shelda Atlas, MD   Brief Narrative: 83 y.o. 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   Patient reported that she took 2 Tylenol  and some pain medications yesterday still continues to have left-sided hip joint pain.  Denies any fall.  Also reported that she develops rash sometime and takes some medication for rash.  Patient denies any chest pain, palpitation, headache, and abdominal pain.   Per chart review of the discussion between ED physician and the patient's son- she lives by herself independently and is ambulatory with walker. Was at her baseline health on Friday. She called her son on Saturday saying that she could not stand up because of her left hip was hurting her. She has not had a fall in over 3 months.  Due to significant pain patient able to bear weight.  Denies any tingling, numbness, and sensory change.  Patient also complaining about bilateral knee joint pain.  Due to debilitating pain she has been lying in her bed with feeling of her taking care of her. She was seen at her PCP today and was told to go over to the emergency department.    Patient was on Xarelto  for the time being as a DVT prophylaxis after previous hip and knee joint replacement.  No previous history of pulmonary embolism or DVT.  Verified with patient son not taking Xarelto  anymore.   ED Course:  At presentation to ED patient is hemodynamically stable.  Afebrile. Lab work, unremarkable normal WBC count and  stable H&H normal platelet count. BMP unremarkable. Pending ESR and CRP.   CT scan of the pelvis showed: 1. Left hip total arthroplasty with medial and superior displacement of the acetabular cup and comminuted fracture of the medial and posterior aspect of the acetabulum. 2. Subacute/chronic appearing fracture of the left ischium. 3. Fracture of the most superior screw in the left acetabular cup. 4. Enlarged fibroid uterus.   X-ray of the hip showedFracture of a screw along the superior aspect of the left acetabular cup. 2. Slight rotation and protrusion of the acetabular cup through the inner wall of the acetabulum. Orthopedic consult is advised   X-ray of the bilateral knee uncomplicated total knee joint prosthesis.   In the ED patient received hydrocodone  and morphine .  ED physician discussed case with on-call orthopedics Dr. Dr. Georgina who said that he had talked to Dr. Addie who was still in discussions with Dr. Patria and Dr. Vernetta as to who might do the surgery.  Recommended keep n.p.o. after midnight and admit to [facility].   Hospitalist has been consulted for further evaluation management of pain control and left-sided hip joint fracture. Assessment & Plan:   Principal Problem:   Closed left hip fracture, initial encounter [facility]) Active Problems:   History of total left hip arthroplasty   History of bilateral knee replacement   Chronic osteoarthritis   Essential hypertension   History of gastroesophageal reflux (GERD)   Closed left hip fracture (HCC)   Left hip fracture History of left hip arthroplasty History of bilateral knee joint replacement History of chronic osteoarthritis History of chronic  pain syndrome Presented emergency department unbearable pain of the left sided hip joint and patient not getting out of the bed for last 1 day.  At the baseline patient has chronic pain however it has been progressively getting worse.  Patient denies any fall or trauma.   X-ray of the hip showed Fracture of a screw along the superior aspect of the left  acetabular cup.  Slight rotation and protrusion of the acetabular cup through the inner wall of the acetabulum.  CT pelvis showed left hip total arthroplasty with medial and superior displacement of the acetabular and comminuted fracture of the medial and posterior aspect of the acetabulum.Subacute/chronic appearing fracture of the left ischium. . Fracture of the most superior screw in the left acetabular cup. X-ray bilateral knee no evidence of fracture.  Uncomplicated bilateral knee prosthesis. ED physician consulted and discussed case with Dr. Georgina who spoken with Dr. Addie and is still deciding either Dr. Patria or Dr. Vernetta will do the surgery tomorrow.  Dr. Georgina recommended keep patient n.p.o. after midnight and patient can stay at [facility] long.  Dr. Brion note reviewed.  Plan for aspiration of the left hip to rule out infection 01/23/2024 Continue pain control-giving Tylenol  1000 mg one-time dose, continue Norco 5 mg every 6 hour as needed for moderate pain and Dilaudid  0.5 to 1 mg every 3 hour as needed for severe pain control.  Continue Robaxin  3 times daily as needed. Holding Xarelto .    Hypertension: Continue metoprolol  succinate 25 mg daily.  Holding HCTZ.  As needed hydralazine  ordered  Glaucoma restart home eyedrops    Estimated body mass index is 36.9 kg/m as calculated from the following:   Height as of this encounter: 5' 2 (1.575 m).   Weight as of this encounter: 91.5 kg.  DVT prophylaxis: none Code Status: full Family Communication:none Disposition Plan:  Status is: Inpatient Remains inpatient appropriate because: acute fracture left hip   Consultants:  ortho  Procedures: none  Antimicrobials none  Subjective: Patient resting in bed appears to be in some distress and pain asking for pain pills EKG no acute findings No echo in system  Objective: Vitals:   01/22/24 2054 01/22/24  2255 01/23/24 0319 01/23/24 0625  BP:  99/75 (!) 158/62 (!) 130/119  Pulse:  (!) 53 (!) 58 (!) 58  Resp:  18 18 18   Temp: 97.7 F (36.5 C) 97.7 F (36.5 C) (!) 97.5 F (36.4 C) 98.7 F (37.1 C)  TempSrc: Oral Oral Oral Oral  SpO2:  100% 96% 98%  Weight:  91.5 kg    Height:  5' 2 (1.575 m)      Intake/Output Summary (Last 24 hours) at 01/23/2024 1213 Last data filed at 01/23/2024 0600 Gross per 24 hour  Intake 419.85 ml  Output 150 ml  Net 269.85 ml   Filed Weights   01/22/24 1256 01/22/24 2255  Weight: 70 kg 91.5 kg    Examination:  General exam: Appears in mild distress Respiratory system: Clear to auscultation. Respiratory effort normal. Cardiovascular system: Regular Gastrointestinal system: Obese soft nontender Central nervous system: Alert and oriented. No focal neurological deficits. Extremities: Tender left hip    Data Reviewed: I have personally reviewed following labs and imaging studies  CBC: Recent Labs  Lab 01/22/24 1441 01/23/24 0344  WBC 6.4 4.7  NEUTROABS 3.4  --   HGB 11.5* 11.5*  HCT 37.7 37.4  MCV 88.5 89.9  PLT 309 280   Basic Metabolic Panel: Recent Labs  Lab 01/22/24 1441 01/23/24 0344  NA 139 139  K 4.2 4.2  CL 104 104  CO2 25 26  GLUCOSE 100* 96  BUN 23 21  CREATININE 0.90 0.81  CALCIUM 9.0 8.9   GFR: Estimated Creatinine Clearance: 56.4 mL/min (by C-G formula based on SCr of 0.81 mg/dL). Liver Function Tests: Recent Labs  Lab 01/22/24 1441 01/23/24 0344  AST 18 13*  ALT 9 <5  ALKPHOS 74 70  BILITOT 0.4 0.4  PROT 7.3 7.3  ALBUMIN  3.7 3.5   No results for input(s): LIPASE, AMYLASE in the last 168 hours. No results for input(s): AMMONIA in the last 168 hours. Coagulation Profile: Recent Labs  Lab 01/23/24 0344  INR 1.2   Cardiac Enzymes: No results for input(s): CKTOTAL, CKMB, CKMBINDEX, TROPONINI in the last 168 hours. BNP (last 3 results) No results for input(s): PROBNP in the last 8760  hours. HbA1C: No results for input(s): HGBA1C in the last 72 hours. CBG: No results for input(s): GLUCAP in the last 168 hours. Lipid Profile: No results for input(s): CHOL, HDL, LDLCALC, TRIG, CHOLHDL, LDLDIRECT in the last 72 hours. Thyroid Function Tests: No results for input(s): TSH, T4TOTAL, FREET4, T3FREE, THYROIDAB in the last 72 hours. Anemia Panel: No results for input(s): VITAMINB12, FOLATE, FERRITIN, TIBC, IRON, RETICCTPCT in the last 72 hours. Sepsis Labs: No results for input(s): PROCALCITON, LATICACIDVEN in the last 168 hours.  Recent Results (from the past 240 hours)  Surgical PCR screen     Status: None   Collection Time: 01/23/24 12:36 AM   Specimen: Nasal Mucosa; Nasal Swab  Result Value Ref Range Status   MRSA, PCR NEGATIVE NEGATIVE Final   Staphylococcus aureus NEGATIVE NEGATIVE Final    Comment: (NOTE) The Xpert SA Assay (FDA approved for NASAL specimens in patients 11 years of age and older), is one component of a comprehensive surveillance program. It is not intended to diagnose infection nor to guide or monitor treatment. Performed at [facility], [address] [place], [place] [address]          Radiology Studies: CT PELVIS WO CONTRAST Result Date: 01/22/2024 CLINICAL DATA:  Hip trauma, fracture suspected.  Left hip pain. EXAM: CT PELVIS WITHOUT CONTRAST TECHNIQUE: Multidetector CT imaging of the pelvis was performed following the standard protocol without intravenous contrast. RADIATION DOSE REDUCTION: This exam was performed according to the departmental dose-optimization program which includes automated exposure control, adjustment of the mA and/or kV according to patient size and/or use of iterative reconstruction technique. COMPARISON:  Left hip x-ray 01/22/2024. left hip x-ray 10/01/2017. FINDINGS: Urinary Tract: Bladder is not well seen secondary to streak artifact in the pelvis. Bowel:   Unremarkable visualized pelvic bowel loops. Vascular/Lymphatic: No pathologically enlarged lymph nodes. No significant vascular abnormality seen. Reproductive: Uterus is enlarged and lobulated measuring 14 0.5 by 14 cm. Multiple fibroids are present including a heavily calcified left uterine fibroid measuring 6.7 cm. Other:  There is no free fluid or focal abdominal wall hernia. Musculoskeletal: The bones are diffusely osteopenic. A left hip total arthroplasty is present. The acetabular cup is displaced medially 2 cm and superiorly 3 cm with comminuted fracture of the medial and posterior aspect of the acetabulum. An oblique fracture seen through the ischium which appears subacute/chronic. There is no gross dislocation. Additionally, the most superior screw in the left acetabular cup appears fractured. Right hip arthroplasty appears uncomplicated. IMPRESSION: 1. Left hip total arthroplasty with medial and superior displacement of the acetabular cup and  comminuted fracture of the medial and posterior aspect of the acetabulum. 2. Subacute/chronic appearing fracture of the left ischium. 3. Fracture of the most superior screw in the left acetabular cup. 4. Enlarged fibroid uterus. Electronically Signed   By: Greig Pique M.D.   On: 01/22/2024 19:21   DG Knee 2 Views Left Result Date: 01/22/2024 CLINICAL DATA:  Pain EXAM: LEFT KNEE - 1-2 VIEW; RIGHT KNEE - 1-2 VIEW COMPARISON:  LEFT knee radiographs 05/04/2016 FINDINGS: LEFT knee: LEFT total knee prosthesis is well seated without periprosthetic fracture or lucency. No acute osseous abnormality. Soft tissues are normal. RIGHT knee: RIGHT total knee prosthesis is well seated without periprosthetic fracture or lucency. No acute osseous abnormality. Soft tissues are normal. IMPRESSION: 1. Uncomplicated BILATERAL total knee prostheses. 2. No acute abnormality of the knees. Electronically Signed   By: Aliene Lloyd M.D.   On: 01/22/2024 16:59   DG Knee 2 Views  Right Result Date: 01/22/2024 CLINICAL DATA:  Pain EXAM: LEFT KNEE - 1-2 VIEW; RIGHT KNEE - 1-2 VIEW COMPARISON:  LEFT knee radiographs 05/04/2016 FINDINGS: LEFT knee: LEFT total knee prosthesis is well seated without periprosthetic fracture or lucency. No acute osseous abnormality. Soft tissues are normal. RIGHT knee: RIGHT total knee prosthesis is well seated without periprosthetic fracture or lucency. No acute osseous abnormality. Soft tissues are normal. IMPRESSION: 1. Uncomplicated BILATERAL total knee prostheses. 2. No acute abnormality of the knees. Electronically Signed   By: Aliene Lloyd M.D.   On: 01/22/2024 16:59   DG Hip Unilat W or Wo Pelvis 2-3 Views Left Result Date: 01/22/2024 CLINICAL DATA:  Left hip pain. EXAM: DG HIP (WITH OR WITHOUT PELVIS) 2-3V LEFT COMPARISON:  Pelvic radiograph dated 09/14/2016. FINDINGS: Bilateral total hip arthroplasties. There is fracture of a screw along the superior aspect of the left acetabular cup. The acetabular cup appears slightly rotated. There is protrusion of the acetabular cup through the inner wall of the acetabulum. There is elevation of the inner acetabular cortex versus small fracture fragments. The bones are osteopenic. Calcified density over the pelvis likely fibroids. IMPRESSION: 1. Fracture of a screw along the superior aspect of the left acetabular cup. 2. Slight rotation and protrusion of the acetabular cup through the inner wall of the acetabulum. Orthopedic consult is advised. Electronically Signed   By: Vanetta Chou M.D.   On: 01/22/2024 14:42    Scheduled Meds:  brinzolamide   1 drop Left Eye TID   gabapentin   100 mg Oral TID   latanoprost   1 drop Left Eye QHS   metoprolol  succinate  25 mg Oral Daily   sodium chloride  flush  3 mL Intravenous Q12H   theophylline   150 mg Oral QHS   Continuous Infusions:  sodium chloride      sodium chloride  75 mL/hr at 01/23/24 0957   dextrose  5% lactated ringers  75 mL/hr at 01/23/24 0020     LOS:  1 day    Almarie KANDICE Hoots, MD  01/23/2024, 12:13 PM

## 2024-01-23 NOTE — TOC Initial Note (Signed)
 Transition of Care [facility]) - Initial/Assessment Note    Patient Details  Name: Leslie Ball MRN: [id number] Date of Birth: 09-Mar-1941  Transition of Care [facility]) CM/SW Contact:    Alfonse JONELLE Rex, RN Phone Number: 01/23/2024, 1:49 PM  Clinical Narrative:  Met with patient at bedside to introduce role of TOC/NCM, pt resting in bed with eyes closed, going down for procedure. TOC will continue to follow.                         Patient Goals and CMS Choice            Expected Discharge Plan and Services                                              Prior Living Arrangements/Services                       Activities of Daily Living   ADL Screening (condition at time of admission) Independently performs ADLs?: No Does the patient have a NEW difficulty with bathing/dressing/toileting/self-feeding that is expected to last >3 days?: No Does the patient have a NEW difficulty with getting in/out of bed, walking, or climbing stairs that is expected to last >3 days?: Yes (Initiates electronic notice to provider for possible PT consult) Does the patient have a NEW difficulty with communication that is expected to last >3 days?: No Is the patient deaf or have difficulty hearing?: No Does the patient have difficulty seeing, even when wearing glasses/contacts?: No Does the patient have difficulty concentrating, remembering, or making decisions?: No  Permission Sought/Granted                  Emotional Assessment              Admission diagnosis:  Closed left hip fracture (HCC) [S72.002A] Patient Active Problem List   Diagnosis Date Noted   Closed left hip fracture, initial encounter (HCC) 01/22/2024   History of total left hip arthroplasty 01/22/2024   History of bilateral knee replacement 01/22/2024   Chronic osteoarthritis 01/22/2024   Essential hypertension 01/22/2024   History of gastroesophageal reflux (GERD) 01/22/2024   Closed left hip  fracture (HCC) 01/22/2024   Hip arthritis 07/28/2016   Primary osteoarthritis of left hip 06/15/2016   Nausea with vomiting 07/21/2013   Constipation 07/21/2013   Abdominal pain, epigastric 07/21/2013   Arthritis of knee 07/15/2013   Arthritis of left knee 04/15/2013   PCP:  Shelda Atlas, MD Pharmacy:   [facility] [phone] [address], [place] - [address] AT [facility] OF [facility] [address] [place] [address] Phone: [phone] Fax: [phone]     Social Drivers of Health (SDOH) Social History: SDOH Screenings   Food Insecurity: No Food Insecurity (01/22/2024)  Housing: Low Risk  (01/22/2024)  Transportation Needs: No Transportation Needs (01/22/2024)  Utilities: Not At Risk (01/22/2024)  Tobacco Use: Low Risk  (01/22/2024)   SDOH Interventions:     Readmission Risk Interventions     No data to display

## 2024-01-23 NOTE — Progress Notes (Signed)
  Echocardiogram 2D Echocardiogram has been performed.  Koleen KANDICE Popper, RDCS 01/23/2024, 3:46 PM

## 2024-01-23 NOTE — Progress Notes (Addendum)
 Patient seen and examined History given by family is 1 of a fall about 4 to 6 weeks ago from which she was able to walk.  Describes acute onset of inability to walk this past weekend.  Does not describe any definite trauma to precipitate that.  Also denies any fevers and chills or malaise.  She was evaluated in the emergency department and found to have significant periprosthetic fracture around the acetabulum.  Pelvic dissociation likely. 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  Aspiration done today to rule out infection.  White count normal but sed rate C-reactive protein mildly elevated.  Denies any other joint complaints in the knee on the right and left-hand side as well as the right hip.  No effusion in either knee.  Infection possible but less likely.  Patient does have poor bone quality as well which has likely contributed to this problem.  Plan at this time is to see the results of the hip aspiration in terms of infection or no infection.  I will reach out to 2 reconstructive hip surgeons to see if this is something they believe could be done here or whether or not it would need to be done elsewhere.  Okay to eat for now.  Will add SCDs for DVT prophylaxis. Could consider subcu heparin  for DVT prophylaxis.SABRA

## 2024-01-23 NOTE — Progress Notes (Addendum)
 Attempted echocardiogram @ 2:25pm, pt not room. Will try again

## 2024-01-23 NOTE — Progress Notes (Signed)
 Ok to eat No surgery pending for today

## 2024-01-23 NOTE — Plan of Care (Signed)
  Problem: Education: Goal: Knowledge of General Education information will improve Description: Including pain rating scale, medication(s)/side effects and non-pharmacologic comfort measures Outcome: Progressing   Problem: Coping: Goal: Level of anxiety will decrease Outcome: Progressing   Problem: Elimination: Goal: Will not experience complications related to urinary retention Outcome: Progressing   Problem: Safety: Goal: Ability to remain free from injury will improve Outcome: Progressing   Problem: Pain Managment: Goal: General experience of comfort will improve and/or be controlled Outcome: Progressing

## 2024-01-24 DIAGNOSIS — S72002A Fracture of unspecified part of neck of left femur, initial encounter for closed fracture: Secondary | ICD-10-CM | POA: Diagnosis not present

## 2024-01-24 LAB — SYNOVIAL CELL COUNT + DIFF, W/ CRYSTALS
Crystals, Fluid: NONE SEEN
Eosinophils-Synovial: 0 % (ref 0–1)
Lymphocytes-Synovial Fld: 6 % (ref 0–20)
Monocyte-Macrophage-Synovial Fluid: 1 % — ABNORMAL LOW (ref 50–90)
Neutrophil, Synovial: 92 % — ABNORMAL HIGH (ref 0–25)
Other Cells-SYN: 1
WBC, Synovial: UNDETERMINED /mm3 (ref 0–200)

## 2024-01-24 MED ORDER — BRINZOLAMIDE 1 % OP SUSP: 1.0000 [drp] | Freq: Three times a day (TID) | OPHTHALMIC | Status: DC

## 2024-01-24 MED ORDER — METHOCARBAMOL 500 MG PO TABS
500.0000 mg | ORAL_TABLET | Freq: Every day | ORAL | Status: DC
  Administered 2024-01-24 – 2024-01-26 (×3): 500 mg via ORAL
  Filled 2024-01-24 (×3): qty 1

## 2024-01-24 MED ORDER — HYDROXYZINE HCL 10 MG PO TABS
10.0000 mg | ORAL_TABLET | Freq: Three times a day (TID) | ORAL | Status: DC
  Administered 2024-01-24 – 2024-01-26 (×6): 10 mg via ORAL
  Filled 2024-01-24 (×7): qty 1

## 2024-01-24 MED ORDER — MELATONIN 3 MG PO TABS
3.0000 mg | ORAL_TABLET | Freq: Every day | ORAL | Status: DC
  Administered 2024-01-24 – 2024-01-26 (×3): 3 mg via ORAL
  Filled 2024-01-24 (×3): qty 1

## 2024-01-24 MED ORDER — MONTELUKAST SODIUM 10 MG PO TABS
10.0000 mg | ORAL_TABLET | Freq: Every day | ORAL | Status: DC
  Administered 2024-01-24 – 2024-01-26 (×3): 10 mg via ORAL
  Filled 2024-01-24 (×3): qty 1

## 2024-01-24 MED ORDER — LATANOPROST 0.005 % OP SOLN
1.0000 [drp] | Freq: Every day | OPHTHALMIC | Status: DC
  Administered 2024-01-24 – 2024-01-26 (×3): 1 [drp] via OPHTHALMIC
  Filled 2024-01-24: qty 2.5

## 2024-01-24 MED ORDER — DORZOLAMIDE HCL 2 % OP SOLN
1.0000 [drp] | Freq: Two times a day (BID) | OPHTHALMIC | Status: DC
  Administered 2024-01-24 – 2024-01-26 (×5): 1 [drp] via OPHTHALMIC
  Filled 2024-01-24: qty 10

## 2024-01-24 MED ORDER — HEPARIN SODIUM (PORCINE) 5000 UNIT/ML IJ SOLN
5000.0000 [IU] | Freq: Three times a day (TID) | INTRAMUSCULAR | Status: DC
  Administered 2024-01-24 – 2024-01-27 (×9): 5000 [IU] via SUBCUTANEOUS
  Filled 2024-01-24 (×9): qty 1

## 2024-01-24 MED ORDER — FLUTICASONE FUROATE-VILANTEROL 200-25 MCG/ACT IN AEPB
1.0000 | INHALATION_SPRAY | Freq: Every day | RESPIRATORY_TRACT | Status: DC
  Administered 2024-01-25 – 2024-01-27 (×3): 1 via RESPIRATORY_TRACT
  Filled 2024-01-24: qty 28

## 2024-01-24 NOTE — TOC Progression Note (Signed)
 Transition of Care [facility]) - Progression Note    Patient Details  Name: Leslie Ball MRN: [id number] Date of Birth: 1940/12/29  Transition [facility]) CM/SW Contact  Alfonse JONELLE Rex, RN Phone Number: 01/24/2024, 2:12 PM  Clinical Narrative:   Call to patient's son, Sam, introduced role of TOC/NCM and reviewed for dc planning, Sam reports patient has an established PCP, she lives alone,  has a HHA that comes daily for about 2.5 hours. Sam reports she lives close to patient and assist with her care. Home DME: [facility]. TOC will continue to follow.                       Expected Discharge Plan and Services                                               Social Drivers of Health (SDOH) Interventions SDOH Screenings   Food Insecurity: No Food Insecurity (01/22/2024)  Housing: Low Risk  (01/22/2024)  Transportation Needs: No Transportation Needs (01/22/2024)  Utilities: Not At Risk (01/22/2024)  Tobacco Use: Low Risk  (01/22/2024)    Readmission Risk Interventions     No data to display

## 2024-01-24 NOTE — Progress Notes (Signed)
 PROGRESS NOTE    ANSELMA HERBEL  FMW:[id number] DOB: [birth year] DOA: 01/22/2024 PCP: Shelda Atlas, MD   Brief Narrative: 83 y.o. 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   Patient reported that she took 2 Tylenol  and some pain medications yesterday still continues to have left-sided hip joint pain.  Denies any fall.  Also reported that she develops rash sometime and takes some medication for rash.  Patient denies any chest pain, palpitation, headache, and abdominal pain.   Per chart review of the discussion between ED physician and the patient's son- she lives by herself independently and is ambulatory with walker. Was at her baseline health on Friday. She called her son on Saturday saying that she could not stand up because of her left hip was hurting her. She has not had a fall in over 3 months.  Due to significant pain patient able to bear weight.  Denies any tingling, numbness, and sensory change.  Patient also complaining about bilateral knee joint pain.  Due to debilitating pain she has been lying in her bed with feeling of her taking care of her. She was seen at her PCP today and was told to go over to the emergency department.    Patient was on Xarelto  for the time being as a DVT prophylaxis after previous hip and knee joint replacement.  No previous history of pulmonary embolism or DVT.  Verified with patient son not taking Xarelto  anymore.   ED Course:  At presentation to ED patient is hemodynamically stable.  Afebrile. Lab work, unremarkable normal WBC count and  stable H&H normal platelet count. BMP unremarkable. Pending ESR and CRP.   CT scan of the pelvis showed: 1. Left hip total arthroplasty with medial and superior displacement of the acetabular cup and comminuted fracture of the medial and posterior aspect of the acetabulum. 2. Subacute/chronic appearing fracture of the left ischium. 3. Fracture of the most superior screw in the left acetabular cup. 4. Enlarged fibroid uterus.   X-ray of the hip showedFracture of a screw along the superior aspect of the left acetabular cup. 2. Slight rotation and protrusion of the acetabular cup through the inner wall of the acetabulum. Orthopedic consult is advised   X-ray of the bilateral knee uncomplicated total knee joint prosthesis.   In the ED patient received hydrocodone  and morphine .  ED physician discussed case with on-call orthopedics Dr. Dr. Georgina who said that he had talked to Dr. Addie who was still in discussions with Dr. Patria and Dr. Vernetta as to who might do the surgery.  Recommended keep n.p.o. after midnight and admit to [facility].   Hospitalist has been consulted for further evaluation management of pain control and left-sided hip joint fracture. Assessment & Plan:   Principal Problem:   Closed left hip fracture, initial encounter [facility]) Active Problems:   History of total left hip arthroplasty   History of bilateral knee replacement   Chronic osteoarthritis   Essential hypertension   History of gastroesophageal reflux (GERD)   Closed left hip fracture (HCC)   Left hip fracture History of left hip arthroplasty History of bilateral knee joint replacement History of chronic osteoarthritis History of chronic  pain syndrome Presented emergency department unbearable pain of the left sided hip joint and patient not getting out of the bed for last 1 day.  At the baseline patient has chronic pain however it has been progressively getting worse.  Patient denies any fall or trauma.   X-ray of the hip showed Fracture of a screw along the superior aspect of the left  acetabular cup.  Slight rotation and protrusion of the acetabular cup through the inner wall of the acetabulum.  CT pelvis showed left hip total arthroplasty with medial and superior displacement of the acetabular and comminuted fracture of the medial and posterior aspect of the acetabulum.Subacute/chronic appearing fracture of the left ischium. . Fracture of the most superior screw in the left acetabular cup. X-ray bilateral knee no evidence of fracture.  Uncomplicated bilateral knee prosthesis.  Dr. Addie Beers following. Status post aspiration of the left hip to rule out infection 01/23/2024 Continue pain control-giving Tylenol  1000 mg one-time dose, continue Norco 5 mg every 6 hour as needed for moderate pain and Dilaudid  0.5 to 1 mg every 3 hour as needed for severe pain control.  Continue Robaxin  3 times daily as needed. Holding Xarelto .    Hypertension: Continue metoprolol  succinate 25 mg daily.  Holding HCTZ.  As needed hydralazine  ordered  Glaucoma continue  eyedrops   Estimated body mass index is 36.9 kg/m as calculated from the following:   Height as of this encounter: 5' 2 (1.575 m).   Weight as of this encounter: 91.5 kg.  DVT prophylaxis: none Code Status: full Family Communication: d/w with son Disposition Plan:  Status is: Inpatient Remains inpatient appropriate because: acute fracture left hip   Consultants:  ortho  Procedures: none  Antimicrobials none  Subjective:  She is resting in bed a little restless and comfortable due to pain Ortho notes reviewed Discussed with son Objective: Vitals:   01/23/24 0625 01/23/24 1802 01/23/24 2132 01/24/24 0650  BP: (!) 130/119 (!) 116/47 (!) 159/61 (!) 164/101  Pulse: (!) 58 61 (!) 57 60  Resp: 18 20 18 18   Temp: 98.7 F (37.1 C) 98 F (36.7 C) 98.1 F (36.7 C) 99 F (37.2 C)  TempSrc: Oral Oral Oral Oral  SpO2: 98% 92% 98% 100%   Weight:      Height:        Intake/Output Summary (Last 24 hours) at 01/24/2024 1304 Last data filed at 01/24/2024 1031 Gross per 24 hour  Intake 1453.25 ml  Output 625 ml  Net 828.25 ml   Filed Weights   01/22/24 1256 01/22/24 2255  Weight: 70 kg 91.5 kg    Examination:  General exam: Appears in mild distress Respiratory system: Clear to auscultation. Respiratory effort normal. Cardiovascular system: Regular Gastrointestinal system: Obese soft nontender Central nervous system: Alert and oriented. No focal neurological deficits. Extremities: Tender left hip    Data Reviewed: I have personally reviewed following labs and imaging studies  CBC: Recent Labs  Lab 01/22/24 1441 01/23/24 0344  WBC 6.4 4.7  NEUTROABS 3.4  --   HGB 11.5* 11.5*  HCT 37.7 37.4  MCV 88.5 89.9  PLT 309 280   Basic Metabolic Panel: Recent Labs  Lab 01/22/24 1441 01/23/24 0344  NA 139 139  K 4.2 4.2  CL 104 104  CO2 25 26  GLUCOSE 100* 96  BUN 23 21  CREATININE 0.90 0.81  CALCIUM 9.0 8.9   GFR: Estimated Creatinine Clearance: 56.4 mL/min (by C-G formula based on SCr of 0.81  mg/dL). Liver Function Tests: Recent Labs  Lab 01/22/24 1441 01/23/24 0344  AST 18 13*  ALT 9 <5  ALKPHOS 74 70  BILITOT 0.4 0.4  PROT 7.3 7.3  ALBUMIN  3.7 3.5   No results for input(s): LIPASE, AMYLASE in the last 168 hours. No results for input(s): AMMONIA in the last 168 hours. Coagulation Profile: Recent Labs  Lab 01/23/24 0344  INR 1.2   Cardiac Enzymes: No results for input(s): CKTOTAL, CKMB, CKMBINDEX, TROPONINI in the last 168 hours. BNP (last 3 results) No results for input(s): PROBNP in the last 8760 hours. HbA1C: No results for input(s): HGBA1C in the last 72 hours. CBG: No results for input(s): GLUCAP in the last 168 hours. Lipid Profile: No results for input(s): CHOL, HDL, LDLCALC, TRIG, CHOLHDL, LDLDIRECT in the last 72 hours. Thyroid Function  Tests: No results for input(s): TSH, T4TOTAL, FREET4, T3FREE, THYROIDAB in the last 72 hours. Anemia Panel: No results for input(s): VITAMINB12, FOLATE, FERRITIN, TIBC, IRON, RETICCTPCT in the last 72 hours. Sepsis Labs: No results for input(s): PROCALCITON, LATICACIDVEN in the last 168 hours.  Recent Results (from the past 240 hours)  Surgical PCR screen     Status: None   Collection Time: 01/23/24 12:36 AM   Specimen: Nasal Mucosa; Nasal Swab  Result Value Ref Range Status   MRSA, PCR NEGATIVE NEGATIVE Final   Staphylococcus aureus NEGATIVE NEGATIVE Final    Comment: (NOTE) The Xpert SA Assay (FDA approved for NASAL specimens in patients 14 years of age and older), is one component of a comprehensive surveillance program. It is not intended to diagnose infection nor to guide or monitor treatment. Performed at [facility], [address], [place], [place] [address]   Aerobic/Anaerobic Culture w Gram Stain (surgical/deep wound)     Status: None (Preliminary result)   Collection Time: 01/23/24  3:00 PM   Specimen: Joint, Left Hip; Synovial Fluid  Result Value Ref Range Status   Specimen Description   Final    SYNOVIAL LEFT HIP Performed at [facility], [address], [place], [place] [address]    Special Requests   Final    NONE Performed at [facility], [address], [place], [place] [address]    Gram Stain   Final    RARE WBC PRESENT, PREDOMINANTLY PMN NO ORGANISMS SEEN    Culture   Final    NO GROWTH < 12 HOURS Performed at [facility], [address], [place], [place] [address]    Report Status PENDING  Incomplete         Radiology Studies: ECHOCARDIOGRAM COMPLETE Result Date: 01/23/2024    ECHOCARDIOGRAM REPORT   Patient Name:   CLORINDA WYBLE Date of Exam: 01/23/2024 Medical Rec #:  [id number]       Height:       62.0 in Accession #:    [id number]      Weight:        201.7 lb Date of Birth:  1940/06/10       BSA:          1.919 m Patient Age:    83 years        BP:           130/119 mmHg Patient Gender: F               HR:           54 bpm. Exam Location:  Inpatient Procedure:  2D Echo, Cardiac Doppler and Color Doppler (Both Spectral and Color            Flow Doppler were utilized during procedure). Indications:    Abnormal ECG R94.31  History:        Patient has no prior history of Echocardiogram examinations.                 Abnormal ECG; Risk Factors:Hypertension.  Sonographer:    Koleen Popper RDCS Referring Phys: [id number] ALMARIE MATSU Ashok Sawaya  Sonographer Comments: Suboptimal parasternal window. Image acquisition challenging due to respiratory motion. IMPRESSIONS  1. Left ventricular ejection fraction, by estimation, is 60 to 65%. The left ventricle has normal function. The left ventricle has no regional wall motion abnormalities. There is mild left ventricular hypertrophy. Left ventricular diastolic parameters are indeterminate.  2. Right ventricular systolic function is normal. The right ventricular size is normal. There is normal pulmonary artery systolic pressure. The estimated right ventricular systolic pressure is 21.4 mmHg.  3. The mitral valve is grossly normal. Trivial mitral valve regurgitation.  4. The aortic valve was not well visualized. Aortic valve regurgitation is mild. No aortic stenosis is present.  5. The inferior vena cava is dilated in size with >50% respiratory variability, suggesting right atrial pressure of 8 mmHg. FINDINGS  Left Ventricle: Left ventricular ejection fraction, by estimation, is 60 to 65%. The left ventricle has normal function. The left ventricle has no regional wall motion abnormalities. The left ventricular internal cavity size was normal in size. There is  mild left ventricular hypertrophy. Left ventricular diastolic parameters are indeterminate. Right Ventricle: The right ventricular size is normal. No increase in right ventricular  wall thickness. Right ventricular systolic function is normal. There is normal pulmonary artery systolic pressure. The tricuspid regurgitant velocity is 1.83 m/s, and  with an assumed right atrial pressure of 8 mmHg, the estimated right ventricular systolic pressure is 21.4 mmHg. Left Atrium: Left atrial size was normal in size. Right Atrium: Right atrial size was normal in size. Pericardium: There is no evidence of pericardial effusion. Mitral Valve: The mitral valve is grossly normal. Trivial mitral valve regurgitation. Tricuspid Valve: The tricuspid valve is grossly normal. Tricuspid valve regurgitation is trivial. Aortic Valve: The aortic valve was not well visualized. Aortic valve regurgitation is mild. Aortic regurgitation PHT measures 728 msec. No aortic stenosis is present. Pulmonic Valve: The pulmonic valve was not well visualized. Pulmonic valve regurgitation is not visualized. Aorta: The aortic root and ascending aorta are structurally normal, with no evidence of dilitation. Venous: The inferior vena cava is dilated in size with greater than 50% respiratory variability, suggesting right atrial pressure of 8 mmHg. IAS/Shunts: The atrial septum is grossly normal.  LEFT VENTRICLE PLAX 2D LVIDd:         4.40 cm   Diastology LVIDs:         3.30 cm   LV e' medial:    0.05 cm/s LV PW:         1.30 cm   LV E/e' medial:  15.3 LV IVS:        1.10 cm   LV e' lateral:   0.07 cm/s LVOT diam:     2.20 cm   LV E/e' lateral: 12.7 LV SV:         105 LV SV Index:   55 LVOT Area:     3.80 cm  RIGHT VENTRICLE             IVC RV S  prime:     23.60 cm/s  IVC diam: 2.20 cm TAPSE (M-mode): 3.0 cm LEFT ATRIUM             Index LA diam:        4.30 cm 2.24 cm/m LA Vol (A2C):   51.4 ml 26.79 ml/m LA Vol (A4C):   38.7 ml 20.17 ml/m LA Biplane Vol: 46.4 ml 24.18 ml/m  AORTIC VALVE LVOT Vmax:   123.00 cm/s LVOT Vmean:  75.100 cm/s LVOT VTI:    0.276 m AI PHT:      728 msec  AORTA Ao Root diam: 3.20 cm Ao Asc diam:  3.20 cm MITRAL  VALVE               TRICUSPID VALVE MV Area (PHT): 3.19 cm    TR Peak grad:   13.4 mmHg MV Decel Time: 238 msec    TR Vmax:        183.00 cm/s MR Peak grad: 69.6 mmHg MR Vmax:      417.00 cm/s  SHUNTS MV E velocity: 0.83 cm/s   Systemic VTI:  0.28 m MV A velocity: 84.40 cm/s  Systemic Diam: 2.20 cm MV E/A ratio:  0.01 Lonni Nanas MD Electronically signed by Lonni Nanas MD Signature Date/Time: 01/23/2024/5:50:04 PM    Final    DG FL ASP/INJ MAJOR (SHOULDER, HIP, KNEE) Result Date: 01/23/2024 INDICATION: Left hip joint effusion or infection. EXAM: ARTHROCENTESIS/INJECTION OF LARGE JOINT COMPARISON:  CT scan of January 22, 2024. CONTRAST:  None. FLUOROSCOPY TIME:  1.2 minutes. COMPLICATIONS: None. PROCEDURE: Through the use of a video translator, informed written consent was obtained from the patient after discussion of the risks, benefits and alternatives to treatment. The patient was placed right posterior oblique position on the fluoroscopy table. The left hip was localized with fluoroscopy. The skin overlying the anterior aspect of the hip was prepped and draped in usual sterile fashion. A 18 gauge spinal needle was advanced toward the left hip femoral prosthesis. A fluoroscopic image was saved and sent to PACs. Approximately 4 mL of bloody fluid was aspirated. The syringe was capped and sent to the laboratory for analysis as ordered by the clinical team. The needle was removed and a dressing was placed. The patient tolerated procedure well without immediate postprocedural complication. IMPRESSION: Successful fluoroscopic guided aspiration of the left hip. Electronically Signed   By: Lynwood Landy Raddle M.D.   On: 01/23/2024 15:16   CT PELVIS WO CONTRAST Result Date: 01/22/2024 CLINICAL DATA:  Hip trauma, fracture suspected.  Left hip pain. EXAM: CT PELVIS WITHOUT CONTRAST TECHNIQUE: Multidetector CT imaging of the pelvis was performed following the standard protocol without intravenous contrast.  RADIATION DOSE REDUCTION: This exam was performed according to the departmental dose-optimization program which includes automated exposure control, adjustment of the mA and/or kV according to patient size and/or use of iterative reconstruction technique. COMPARISON:  Left hip x-ray 01/22/2024. left hip x-ray 10/01/2017. FINDINGS: Urinary Tract: Bladder is not well seen secondary to streak artifact in the pelvis. Bowel:  Unremarkable visualized pelvic bowel loops. Vascular/Lymphatic: No pathologically enlarged lymph nodes. No significant vascular abnormality seen. Reproductive: Uterus is enlarged and lobulated measuring 14 0.5 by 14 cm. Multiple fibroids are present including a heavily calcified left uterine fibroid measuring 6.7 cm. Other:  There is no free fluid or focal abdominal wall hernia. Musculoskeletal: The bones are diffusely osteopenic. A left hip total arthroplasty is present. The acetabular cup is displaced medially 2 cm and superiorly 3  cm with comminuted fracture of the medial and posterior aspect of the acetabulum. An oblique fracture seen through the ischium which appears subacute/chronic. There is no gross dislocation. Additionally, the most superior screw in the left acetabular cup appears fractured. Right hip arthroplasty appears uncomplicated. IMPRESSION: 1. Left hip total arthroplasty with medial and superior displacement of the acetabular cup and comminuted fracture of the medial and posterior aspect of the acetabulum. 2. Subacute/chronic appearing fracture of the left ischium. 3. Fracture of the most superior screw in the left acetabular cup. 4. Enlarged fibroid uterus. Electronically Signed   By: Greig Pique M.D.   On: 01/22/2024 19:21   DG Knee 2 Views Left Result Date: 01/22/2024 CLINICAL DATA:  Pain EXAM: LEFT KNEE - 1-2 VIEW; RIGHT KNEE - 1-2 VIEW COMPARISON:  LEFT knee radiographs 05/04/2016 FINDINGS: LEFT knee: LEFT total knee prosthesis is well seated without periprosthetic  fracture or lucency. No acute osseous abnormality. Soft tissues are normal. RIGHT knee: RIGHT total knee prosthesis is well seated without periprosthetic fracture or lucency. No acute osseous abnormality. Soft tissues are normal. IMPRESSION: 1. Uncomplicated BILATERAL total knee prostheses. 2. No acute abnormality of the knees. Electronically Signed   By: Aliene Lloyd M.D.   On: 01/22/2024 16:59   DG Knee 2 Views Right Result Date: 01/22/2024 CLINICAL DATA:  Pain EXAM: LEFT KNEE - 1-2 VIEW; RIGHT KNEE - 1-2 VIEW COMPARISON:  LEFT knee radiographs 05/04/2016 FINDINGS: LEFT knee: LEFT total knee prosthesis is well seated without periprosthetic fracture or lucency. No acute osseous abnormality. Soft tissues are normal. RIGHT knee: RIGHT total knee prosthesis is well seated without periprosthetic fracture or lucency. No acute osseous abnormality. Soft tissues are normal. IMPRESSION: 1. Uncomplicated BILATERAL total knee prostheses. 2. No acute abnormality of the knees. Electronically Signed   By: Aliene Lloyd M.D.   On: 01/22/2024 16:59   DG Hip Unilat W or Wo Pelvis 2-3 Views Left Result Date: 01/22/2024 CLINICAL DATA:  Left hip pain. EXAM: DG HIP (WITH OR WITHOUT PELVIS) 2-3V LEFT COMPARISON:  Pelvic radiograph dated 09/14/2016. FINDINGS: Bilateral total hip arthroplasties. There is fracture of a screw along the superior aspect of the left acetabular cup. The acetabular cup appears slightly rotated. There is protrusion of the acetabular cup through the inner wall of the acetabulum. There is elevation of the inner acetabular cortex versus small fracture fragments. The bones are osteopenic. Calcified density over the pelvis likely fibroids. IMPRESSION: 1. Fracture of a screw along the superior aspect of the left acetabular cup. 2. Slight rotation and protrusion of the acetabular cup through the inner wall of the acetabulum. Orthopedic consult is advised. Electronically Signed   By: Vanetta Chou M.D.   On:  01/22/2024 14:42    Scheduled Meds:  brinzolamide   1 drop Left Eye TID   fluticasone  furoate-vilanterol  1 puff Inhalation Daily   gabapentin   100 mg Oral TID   hydrOXYzine   10 mg Oral TID   latanoprost   1 drop Left Eye QHS   melatonin  3 mg Oral QHS   metoprolol  succinate  25 mg Oral Daily   sodium chloride  flush  3 mL Intravenous Q12H   Continuous Infusions:  sodium chloride  75 mL/hr at 01/23/24 2128     LOS: 2 days    Almarie KANDICE Hoots, MD  01/24/2024, 1:04 PM

## 2024-01-24 NOTE — Plan of Care (Signed)

## 2024-01-24 NOTE — Plan of Care (Signed)

## 2024-01-24 NOTE — Progress Notes (Signed)
 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

## 2024-01-25 DIAGNOSIS — S72002A Fracture of unspecified part of neck of left femur, initial encounter for closed fracture: Secondary | ICD-10-CM | POA: Diagnosis not present

## 2024-01-25 LAB — CBC
HCT: 37.5 % (ref 36.0–46.0)
Hemoglobin: 11.5 g/dL — ABNORMAL LOW (ref 12.0–15.0)
MCH: 27.6 pg (ref 26.0–34.0)
MCHC: 30.7 g/dL (ref 30.0–36.0)
MCV: 89.9 fL (ref 80.0–100.0)
Platelets: 263 K/uL (ref 150–400)
RBC: 4.17 MIL/uL (ref 3.87–5.11)
RDW: 16.2 % — ABNORMAL HIGH (ref 11.5–15.5)
WBC: 5 K/uL (ref 4.0–10.5)
nRBC: 0 % (ref 0.0–0.2)

## 2024-01-25 LAB — COMPREHENSIVE METABOLIC PANEL WITH GFR
ALT: 5 U/L (ref 0–44)
AST: 16 U/L (ref 15–41)
Albumin: 3 g/dL — ABNORMAL LOW (ref 3.5–5.0)
Alkaline Phosphatase: 68 U/L (ref 38–126)
Anion gap: 11 (ref 5–15)
BUN: 25 mg/dL — ABNORMAL HIGH (ref 8–23)
CO2: 18 mmol/L — ABNORMAL LOW (ref 22–32)
Calcium: 8.8 mg/dL — ABNORMAL LOW (ref 8.9–10.3)
Chloride: 106 mmol/L (ref 98–111)
Creatinine, Ser: 0.98 mg/dL (ref 0.44–1.00)
GFR, Estimated: 57 mL/min — ABNORMAL LOW (ref >60)
Glucose, Bld: 84 mg/dL (ref 70–99)
Potassium: 4.7 mmol/L (ref 3.5–5.1)
Sodium: 135 mmol/L (ref 135–145)
Total Bilirubin: 0.5 mg/dL (ref 0.0–1.2)
Total Protein: 6.8 g/dL (ref 6.5–8.1)

## 2024-01-25 NOTE — Progress Notes (Signed)
 PROGRESS NOTE    Leslie Ball  FMW:[id number] DOB: [birth year] DOA: 01/22/2024 PCP: Shelda Atlas, MD   Brief Narrative: 83 y.o. 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   Patient reported that she took 2 Tylenol  and some pain medications yesterday still continues to have left-sided hip joint pain.  Denies any fall.  Also reported that she develops rash sometime and takes some medication for rash.  Patient denies any chest pain, palpitation, headache, and abdominal pain.   Per chart review of the discussion between ED physician and the patient's son- she lives by herself independently and is ambulatory with walker. Was at her baseline health on Friday. She called her son on Saturday saying that she could not stand up because of her left hip was hurting her. She has not had a fall in over 3 months.  Due to significant pain patient able to bear weight.  Denies any tingling, numbness, and sensory change.  Patient also complaining about bilateral knee joint pain.  Due to debilitating pain she has been lying in her bed with feeling of her taking care of her. She was seen at her PCP today and was told to go over to the emergency department.    Patient was on Xarelto  for the time being as a DVT prophylaxis after previous hip and knee joint replacement.  No previous history of pulmonary embolism or DVT.  Verified with patient son not taking Xarelto  anymore.   ED Course:  At presentation to ED patient is hemodynamically stable.  Afebrile. Lab work, unremarkable normal WBC count and  stable H&H normal platelet count. BMP unremarkable. Pending ESR and CRP.   CT scan of the pelvis showed: 1. Left hip total arthroplasty with medial and superior displacement of the acetabular cup and comminuted fracture of the medial and posterior aspect of the acetabulum. 2. Subacute/chronic appearing fracture of the left ischium. 3. Fracture of the most superior screw in the left acetabular cup. 4. Enlarged fibroid uterus.   X-ray of the hip showedFracture of a screw along the superior aspect of the left acetabular cup. 2. Slight rotation and protrusion of the acetabular cup through the inner wall of the acetabulum. Orthopedic consult is advised   X-ray of the bilateral knee uncomplicated total knee joint prosthesis.   In the ED patient received hydrocodone  and morphine .  ED physician discussed case with on-call orthopedics Dr. Dr. Georgina who said that he had talked to Dr. Addie who was still in discussions with Dr. Patria and Dr. Vernetta as to who might do the surgery.  Recommended keep n.p.o. after midnight and admit to [facility].   Hospitalist has been consulted for further evaluation management of pain control and left-sided hip joint fracture. Assessment & Plan:   Principal Problem:   Closed left hip fracture, initial encounter [facility]) Active Problems:   History of total left hip arthroplasty   History of bilateral knee replacement   Chronic osteoarthritis   Essential hypertension   History of gastroesophageal reflux (GERD)   Closed left hip fracture (HCC)   Left hip fracture History of left hip arthroplasty History of bilateral knee joint replacement History of chronic osteoarthritis History of chronic  pain syndrome Presented emergency department unbearable pain of the left sided hip joint and patient not getting out of the bed for last 1 day.  At the baseline patient has chronic pain however it has been progressively getting worse.  Patient denies any fall or trauma.   X-ray of the hip showed Fracture of a screw along the superior aspect of the left  acetabular cup.  Slight rotation and protrusion of the acetabular cup through the inner wall of the acetabulum.  CT pelvis showed left hip total arthroplasty with medial and superior displacement of the acetabular and comminuted fracture of the medial and posterior aspect of the acetabulum.Subacute/chronic appearing fracture of the left ischium. . Fracture of the most superior screw in the left acetabular cup. X-ray bilateral knee no evidence of fracture.  Uncomplicated bilateral knee prosthesis.  Dr. Addie Beers following.  Appreciate Dr. Brion assistance in helping us  transfer this patient to [facility]. Status post aspiration of the left hip to rule out infection 01/23/2024.  No growth so far. Continue pain control-giving Tylenol  1000 mg one-time dose, continue Norco 5 mg every 6 hour as needed for moderate pain and Dilaudid  0.5 to 1 mg every 3 hour as needed for severe pain control.  Continue Robaxin  3 times daily as needed. Holding Xarelto .  On heparin  for DVT prophylaxis. Hospitalist to call [phone] Amy at the transfer center on Sunday morning to arrange for transfer on Sunday for surgery on Monday.  Please make sure her heparin  is stopped Sunday night.    Hypertension: Continue metoprolol  succinate 25 mg daily.  Holding HCTZ.  As needed hydralazine  ordered  Glaucoma continue  eyedrops   Estimated body mass index is 36.9 kg/m as calculated from the following:   Height as of this encounter: 5' 2 (1.575 m).   Weight as of this encounter: 91.5 kg.  DVT prophylaxis: none Code Status: full Family Communication: d/w with son Disposition Plan:  Status is: Inpatient Remains inpatient appropriate because: acute fracture left hip   Consultants:  ortho  Procedures: none  Antimicrobials none  Subjective:  She slept better pain better if she does not move Since she is on a diet now will DC IV  fluids   Objective: Vitals:   01/24/24 2106 01/25/24 0500 01/25/24 0901 01/25/24 1337  BP: (!) 123/52 133/62    Pulse: 60   62  Resp: 15   16  Temp: 97.7 F (36.5 C) 97.8 F (36.6 C)  98.5 F (36.9 C)  TempSrc: Oral Oral    SpO2: 94%  95% 96%  Weight:      Height:        Intake/Output Summary (Last 24 hours) at 01/25/2024 1410 Last data filed at 01/25/2024 1013 Gross per 24 hour  Intake 2439.32 ml  Output 1300 ml  Net 1139.32 ml   Filed Weights   01/22/24 1256 01/22/24 2255  Weight: 70 kg 91.5 kg    Examination:  General exam: Appears in mild distress Respiratory system: Clear to auscultation. Respiratory effort normal. Cardiovascular system: Regular Gastrointestinal system: Obese soft nontender Central nervous system: Alert and oriented. No focal neurological deficits. Extremities: Tender left hip Trace edema   Data Reviewed: I have personally reviewed following labs and imaging studies  CBC: Recent Labs  Lab 01/22/24 1441 01/23/24 0344 01/25/24 0523  WBC 6.4 4.7 5.0  NEUTROABS 3.4  --   --   HGB 11.5* 11.5* 11.5*  HCT 37.7 37.4 37.5  MCV 88.5 89.9 89.9  PLT  309 280 263   Basic Metabolic Panel: Recent Labs  Lab 01/22/24 1441 01/23/24 0344 01/25/24 0333  NA 139 139 135  K 4.2 4.2 4.7  CL 104 104 106  CO2 25 26 18*  GLUCOSE 100* 96 84  BUN 23 21 25*  CREATININE 0.90 0.81 0.98  CALCIUM 9.0 8.9 8.8*   GFR: Estimated Creatinine Clearance: 46.6 mL/min (by C-G formula based on SCr of 0.98 mg/dL). Liver Function Tests: Recent Labs  Lab 01/22/24 1441 01/23/24 0344 01/25/24 0333  AST 18 13* 16  ALT 9 <5 5  ALKPHOS 74 70 68  BILITOT 0.4 0.4 0.5  PROT 7.3 7.3 6.8  ALBUMIN  3.7 3.5 3.0*   No results for input(s): LIPASE, AMYLASE in the last 168 hours. No results for input(s): AMMONIA in the last 168 hours. Coagulation Profile: Recent Labs  Lab 01/23/24 0344  INR 1.2   Cardiac Enzymes: No results for input(s): CKTOTAL, CKMB,  CKMBINDEX, TROPONINI in the last 168 hours. BNP (last 3 results) No results for input(s): PROBNP in the last 8760 hours. HbA1C: No results for input(s): HGBA1C in the last 72 hours. CBG: No results for input(s): GLUCAP in the last 168 hours. Lipid Profile: No results for input(s): CHOL, HDL, LDLCALC, TRIG, CHOLHDL, LDLDIRECT in the last 72 hours. Thyroid Function Tests: No results for input(s): TSH, T4TOTAL, FREET4, T3FREE, THYROIDAB in the last 72 hours. Anemia Panel: No results for input(s): VITAMINB12, FOLATE, FERRITIN, TIBC, IRON, RETICCTPCT in the last 72 hours. Sepsis Labs: No results for input(s): PROCALCITON, LATICACIDVEN in the last 168 hours.  Recent Results (from the past 240 hours)  Surgical PCR screen     Status: None   Collection Time: 01/23/24 12:36 AM   Specimen: Nasal Mucosa; Nasal Swab  Result Value Ref Range Status   MRSA, PCR NEGATIVE NEGATIVE Final   Staphylococcus aureus NEGATIVE NEGATIVE Final    Comment: (NOTE) The Xpert SA Assay (FDA approved for NASAL specimens in patients 72 years of age and older), is one component of a comprehensive surveillance program. It is not intended to diagnose infection nor to guide or monitor treatment. Performed at [facility], [address], [place], [place] [address]   Aerobic/Anaerobic Culture w Gram Stain (surgical/deep wound)     Status: None (Preliminary result)   Collection Time: 01/23/24  3:00 PM   Specimen: Joint, Left Hip; Synovial Fluid  Result Value Ref Range Status   Specimen Description   Final    SYNOVIAL LEFT HIP Performed at [facility], [address] [place], [place] [address]    Special Requests   Final    NONE Performed at [facility], [address], [place], [place] [address]    Gram Stain   Final    RARE WBC PRESENT, PREDOMINANTLY PMN NO ORGANISMS SEEN    Culture   Final    NO  GROWTH 2 DAYS Performed at [facility], [address], [place], [place] [address]    Report Status PENDING  Incomplete      Radiology Studies: ECHOCARDIOGRAM COMPLETE Result Date: 01/23/2024    ECHOCARDIOGRAM REPORT   Patient Name:   Leslie Ball Date of Exam: 01/23/2024 Medical Rec #:  [id number]       Height:       62.0 in Accession #:    [id number]      Weight:       201.7 lb Date of Birth:  [birth year]  BSA:          1.919 m Patient Age:    83 years        BP:           130/119 mmHg Patient Gender: F               HR:           54 bpm. Exam Location:  Inpatient Procedure: 2D Echo, Cardiac Doppler and Color Doppler (Both Spectral and Color            Flow Doppler were utilized during procedure). Indications:    Abnormal ECG R94.31  History:        Patient has no prior history of Echocardiogram examinations.                 Abnormal ECG; Risk Factors:Hypertension.  Sonographer:    Koleen Popper RDCS Referring Phys: [id number] ALMARIE MATSU Torie Priebe  Sonographer Comments: Suboptimal parasternal window. Image acquisition challenging due to respiratory motion. IMPRESSIONS  1. Left ventricular ejection fraction, by estimation, is 60 to 65%. The left ventricle has normal function. The left ventricle has no regional wall motion abnormalities. There is mild left ventricular hypertrophy. Left ventricular diastolic parameters are indeterminate.  2. Right ventricular systolic function is normal. The right ventricular size is normal. There is normal pulmonary artery systolic pressure. The estimated right ventricular systolic pressure is 21.4 mmHg.  3. The mitral valve is grossly normal. Trivial mitral valve regurgitation.  4. The aortic valve was not well visualized. Aortic valve regurgitation is mild. No aortic stenosis is present.  5. The inferior vena cava is dilated in size with >50% respiratory variability, suggesting right atrial pressure of 8 mmHg. FINDINGS  Left Ventricle: Left ventricular ejection  fraction, by estimation, is 60 to 65%. The left ventricle has normal function. The left ventricle has no regional wall motion abnormalities. The left ventricular internal cavity size was normal in size. There is  mild left ventricular hypertrophy. Left ventricular diastolic parameters are indeterminate. Right Ventricle: The right ventricular size is normal. No increase in right ventricular wall thickness. Right ventricular systolic function is normal. There is normal pulmonary artery systolic pressure. The tricuspid regurgitant velocity is 1.83 m/s, and  with an assumed right atrial pressure of 8 mmHg, the estimated right ventricular systolic pressure is 21.4 mmHg. Left Atrium: Left atrial size was normal in size. Right Atrium: Right atrial size was normal in size. Pericardium: There is no evidence of pericardial effusion. Mitral Valve: The mitral valve is grossly normal. Trivial mitral valve regurgitation. Tricuspid Valve: The tricuspid valve is grossly normal. Tricuspid valve regurgitation is trivial. Aortic Valve: The aortic valve was not well visualized. Aortic valve regurgitation is mild. Aortic regurgitation PHT measures 728 msec. No aortic stenosis is present. Pulmonic Valve: The pulmonic valve was not well visualized. Pulmonic valve regurgitation is not visualized. Aorta: The aortic root and ascending aorta are structurally normal, with no evidence of dilitation. Venous: The inferior vena cava is dilated in size with greater than 50% respiratory variability, suggesting right atrial pressure of 8 mmHg. IAS/Shunts: The atrial septum is grossly normal.  LEFT VENTRICLE PLAX 2D LVIDd:         4.40 cm   Diastology LVIDs:         3.30 cm   LV e' medial:    0.05 cm/s LV PW:         1.30 cm   LV E/e' medial:  15.3 LV IVS:  1.10 cm   LV e' lateral:   0.07 cm/s LVOT diam:     2.20 cm   LV E/e' lateral: 12.7 LV SV:         105 LV SV Index:   55 LVOT Area:     3.80 cm  RIGHT VENTRICLE             IVC RV S prime:      23.60 cm/s  IVC diam: 2.20 cm TAPSE (M-mode): 3.0 cm LEFT ATRIUM             Index LA diam:        4.30 cm 2.24 cm/m LA Vol (A2C):   51.4 ml 26.79 ml/m LA Vol (A4C):   38.7 ml 20.17 ml/m LA Biplane Vol: 46.4 ml 24.18 ml/m  AORTIC VALVE LVOT Vmax:   123.00 cm/s LVOT Vmean:  75.100 cm/s LVOT VTI:    0.276 m AI PHT:      728 msec  AORTA Ao Root diam: 3.20 cm Ao Asc diam:  3.20 cm MITRAL VALVE               TRICUSPID VALVE MV Area (PHT): 3.19 cm    TR Peak grad:   13.4 mmHg MV Decel Time: 238 msec    TR Vmax:        183.00 cm/s MR Peak grad: 69.6 mmHg MR Vmax:      417.00 cm/s  SHUNTS MV E velocity: 0.83 cm/s   Systemic VTI:  0.28 m MV A velocity: 84.40 cm/s  Systemic Diam: 2.20 cm MV E/A ratio:  0.01 Lonni Nanas MD Electronically signed by Lonni Nanas MD Signature Date/Time: 01/23/2024/5:50:04 PM    Final    DG FL ASP/INJ MAJOR (SHOULDER, HIP, KNEE) Result Date: 01/23/2024 INDICATION: Left hip joint effusion or infection. EXAM: ARTHROCENTESIS/INJECTION OF LARGE JOINT COMPARISON:  CT scan of January 22, 2024. CONTRAST:  None. FLUOROSCOPY TIME:  1.2 minutes. COMPLICATIONS: None. PROCEDURE: Through the use of a video translator, informed written consent was obtained from the patient after discussion of the risks, benefits and alternatives to treatment. The patient was placed right posterior oblique position on the fluoroscopy table. The left hip was localized with fluoroscopy. The skin overlying the anterior aspect of the hip was prepped and draped in usual sterile fashion. A 18 gauge spinal needle was advanced toward the left hip femoral prosthesis. A fluoroscopic image was saved and sent to PACs. Approximately 4 mL of bloody fluid was aspirated. The syringe was capped and sent to the laboratory for analysis as ordered by the clinical team. The needle was removed and a dressing was placed. The patient tolerated procedure well without immediate postprocedural complication. IMPRESSION: Successful  fluoroscopic guided aspiration of the left hip. Electronically Signed   By: Lynwood Landy Raddle M.D.   On: 01/23/2024 15:16    Scheduled Meds:  dorzolamide   1 drop Left Eye BID   fluticasone  furoate-vilanterol  1 puff Inhalation Daily   gabapentin   100 mg Oral TID   heparin  injection (subcutaneous)  5,000 Units Subcutaneous Q8H   hydrOXYzine   10 mg Oral TID   latanoprost   1 drop Left Eye QHS   melatonin  3 mg Oral QHS   methocarbamol   500 mg Oral QHS   metoprolol  succinate  25 mg Oral Daily   montelukast   10 mg Oral QHS   sodium chloride  flush  3 mL Intravenous Q12H   Continuous Infusions:  sodium chloride  75 mL/hr at 01/25/24 1345  LOS: 3 days    Almarie KANDICE Hoots, MD  01/25/2024, 2:10 PM

## 2024-01-25 NOTE — Progress Notes (Signed)
 Patient has been accepted at [facility] for management of her acetabular fracture.  She will be transferred on Sunday for surgery Monday.  Currently on subcu heparin  for DVT prophylaxis.  I discussed this plan with Sam her son.  Emphasized again the significant nature of the injury and the stress on Jenia that surgery would be for her.

## 2024-01-26 DIAGNOSIS — S72002A Fracture of unspecified part of neck of left femur, initial encounter for closed fracture: Secondary | ICD-10-CM | POA: Diagnosis not present

## 2024-01-26 MED ORDER — HYDROXYZINE HCL 25 MG PO TABS
25.0000 mg | ORAL_TABLET | Freq: Four times a day (QID) | ORAL | Status: DC | PRN
  Administered 2024-01-26: 25 mg via ORAL
  Filled 2024-01-26: qty 1

## 2024-01-26 NOTE — Plan of Care (Signed)
  Problem: Nutrition: Goal: Adequate nutrition will be maintained Outcome: Progressing   Problem: Safety: Goal: Ability to remain free from injury will improve Outcome: Progressing   Problem: Education: Goal: Knowledge of General Education information will improve Description: Including pain rating scale, medication(s)/side effects and non-pharmacologic comfort measures Outcome: Not Progressing   Problem: Coping: Goal: Level of anxiety will decrease Outcome: Not Progressing   Problem: Pain Managment: Goal: General experience of comfort will improve and/or be controlled Outcome: Not Progressing   Problem: Skin Integrity: Goal: Risk for impaired skin integrity will decrease Outcome: Not Progressing

## 2024-01-26 NOTE — Progress Notes (Signed)
 PROGRESS NOTE    Leslie Ball  FMW:[id number] DOB: 1940-12-17 DOA: 01/22/2024 PCP: Shelda Atlas, MD   Brief Narrative: Patient is an 83 year old female with medical history significant of left femur fracture status post surgery in the past (unknown timeline), bilateral knee joint placement with prosthesis, chronic osteoarthritis nonspecific generalized osteoarthritis of hip joint, bilateral knee joint and bilateral shoulder used to follow orthopedics and rheumatology outpatient chronic pain management, and essential hypertension.  Patient presented to emergency department with progressive worsening of left sided hip joint pain.    01/26/2024: Plan is to transfer patient to [facility] tomorrow, 01/27/2024 as surgery is planned for Monday, 01/28/2024.  Patient was seen alongside patient's daughter-in-law.  No new complaints.   CT scan of the pelvis showed: 1. Left hip total arthroplasty with medial and superior displacement of the acetabular cup and comminuted fracture of the medial and posterior aspect of the acetabulum. 2. Subacute/chronic appearing fracture of the left ischium. 3. Fracture of the most superior screw in the left acetabular cup. 4. Enlarged fibroid uterus.   X-ray of the hip showedFracture of a screw along the superior aspect of the left acetabular cup. 2. Slight rotation and protrusion of the acetabular cup through the inner wall of the acetabulum. Orthopedic consult is advised   X-ray of the bilateral knee uncomplicated total knee joint prosthesis.   Assessment & Plan:   Principal Problem:   Closed left hip fracture, initial encounter (HCC) Active Problems:   History of total left hip arthroplasty   History of bilateral knee replacement   Chronic osteoarthritis   Essential hypertension   History of gastroesophageal reflux (GERD)   Closed left hip fracture (HCC)   Left hip fracture: History of left hip arthroplasty: History of bilateral knee joint  replacement: History of chronic osteoarthritis: History of chronic pain syndrome: -Patient presented today emergency department with unbearable pain of the left hip joint and patient, and not being able to get out of bed for 1 day. - At the baseline patient has chronic pain however it has been progressively getting worse.-Patient denies any fall or trauma. -X-ray of the hip showed Fracture of a screw along the superior aspect of the left  acetabular cup.  Slight rotation and protrusion of the acetabular cup through the inner wall of the acetabulum.  -CT pelvis showed left hip total arthroplasty with medial and superior displacement of the acetabular and comminuted fracture of the medial and posterior aspect of the acetabulum.Subacute/chronic appearing fracture of the left ischium. . Fracture of the most superior screw in the left acetabular cup. -X-ray bilateral knee no evidence of fracture.  Uncomplicated bilateral knee prosthesis.   - Patient will be transferred to [facility] for surgery on Monday, 01/28/2024.  Orthopedic team is assisting with the transfer.  Patient will be transferred tomorrow, 01/27/2024. - Continue to optimize pain management. - Xarelto  is on hold.  Status post aspiration of the left hip to rule out infection 01/23/2024.  No growth so far.  Subcutaneous heparin  for DVT prophylaxis.    Hospitalist to call [phone], Amy, at the transfer center on Sunday morning to arrange for transfer on Sunday for surgery on Monday.  Please make sure her heparin  is stopped Sunday night.   Hypertension:  Continue metoprolol  succinate 25 mg daily.   Continue to hold HCTZ  IV hydralazine  as needed.    Glaucoma continue  eyedrops   Estimated body mass index is 36.9 kg/m as calculated from the following:   Height  as of this encounter: 5' 2 (1.575 m).   Weight as of this encounter: 91.5 kg.  DVT prophylaxis: none Code Status: full Family Communication: Daughter-in-law was at  bedside Disposition Plan:  Status is: Inpatient Remains inpatient appropriate because: acute fracture left hip   Consultants:  ortho  Procedures: none  Antimicrobials none  Subjective: Pain is controlled.   Objective: Vitals:   01/25/24 2218 01/26/24 0555 01/26/24 0839 01/26/24 1410  BP: 135/64 (!) 155/65  115/71  Pulse: (!) 59 70  82  Resp: 17   18  Temp: 97.6 F (36.4 C) 98.3 F (36.8 C)  98.5 F (36.9 C)  TempSrc: Oral Oral    SpO2: 98% 98% 92% 97%  Weight:      Height:        Intake/Output Summary (Last 24 hours) at 01/26/2024 1544 Last data filed at 01/26/2024 1300 Gross per 24 hour  Intake 1343.62 ml  Output 1530 ml  Net -186.38 ml   Filed Weights   01/22/24 1256 01/22/24 2255  Weight: 70 kg 91.5 kg    Examination:  General exam: Appears in mild distress Respiratory system: Clear to auscultation. Respiratory effort normal. Cardiovascular system: Regular Gastrointestinal system: Obese soft nontender Central nervous system: Alert and oriented. No focal neurological deficits. Extremities: Tender left hip Trace edema   Data Reviewed: I have personally reviewed following labs and imaging studies  CBC: Recent Labs  Lab 01/22/24 1441 01/23/24 0344 01/25/24 0523  WBC 6.4 4.7 5.0  NEUTROABS 3.4  --   --   HGB 11.5* 11.5* 11.5*  HCT 37.7 37.4 37.5  MCV 88.5 89.9 89.9  PLT 309 280 263   Basic Metabolic Panel: Recent Labs  Lab 01/22/24 1441 01/23/24 0344 01/25/24 0333  NA 139 139 135  K 4.2 4.2 4.7  CL 104 104 106  CO2 25 26 18*  GLUCOSE 100* 96 84  BUN 23 21 25*  CREATININE 0.90 0.81 0.98  CALCIUM 9.0 8.9 8.8*   GFR: Estimated Creatinine Clearance: 46.6 mL/min (by C-G formula based on SCr of 0.98 mg/dL). Liver Function Tests: Recent Labs  Lab 01/22/24 1441 01/23/24 0344 01/25/24 0333  AST 18 13* 16  ALT 9 <5 5  ALKPHOS 74 70 68  BILITOT 0.4 0.4 0.5  PROT 7.3 7.3 6.8  ALBUMIN  3.7 3.5 3.0*   No results for input(s): LIPASE,  AMYLASE in the last 168 hours. No results for input(s): AMMONIA in the last 168 hours. Coagulation Profile: Recent Labs  Lab 01/23/24 0344  INR 1.2   Cardiac Enzymes: No results for input(s): CKTOTAL, CKMB, CKMBINDEX, TROPONINI in the last 168 hours. BNP (last 3 results) No results for input(s): PROBNP in the last 8760 hours. HbA1C: No results for input(s): HGBA1C in the last 72 hours. CBG: No results for input(s): GLUCAP in the last 168 hours. Lipid Profile: No results for input(s): CHOL, HDL, LDLCALC, TRIG, CHOLHDL, LDLDIRECT in the last 72 hours. Thyroid Function Tests: No results for input(s): TSH, T4TOTAL, FREET4, T3FREE, THYROIDAB in the last 72 hours. Anemia Panel: No results for input(s): VITAMINB12, FOLATE, FERRITIN, TIBC, IRON, RETICCTPCT in the last 72 hours. Sepsis Labs: No results for input(s): PROCALCITON, LATICACIDVEN in the last 168 hours.  Recent Results (from the past 240 hours)  Surgical PCR screen     Status: None   Collection Time: 01/23/24 12:36 AM   Specimen: Nasal Mucosa; Nasal Swab  Result Value Ref Range Status   MRSA, PCR NEGATIVE NEGATIVE Final   Staphylococcus  aureus NEGATIVE NEGATIVE Final    Comment: (NOTE) The Xpert SA Assay (FDA approved for NASAL specimens in patients 76 years of age and older), is one component of a comprehensive surveillance program. It is not intended to diagnose infection nor to guide or monitor treatment. Performed at [facility], [address], [place], [place] [address]   Aerobic/Anaerobic Culture w Gram Stain (surgical/deep wound)     Status: None (Preliminary result)   Collection Time: 01/23/24  3:00 PM   Specimen: Joint, Left Hip; Synovial Fluid  Result Value Ref Range Status   Specimen Description   Final    SYNOVIAL LEFT HIP Performed at [facility], [address], [place], [place] [address]    Special Requests    Final    NONE Performed at [facility], [address] [place], [place] [address]    Gram Stain   Final    RARE WBC PRESENT, PREDOMINANTLY PMN NO ORGANISMS SEEN    Culture   Final    NO GROWTH 3 DAYS NO ANAEROBES ISOLATED; CULTURE IN PROGRESS FOR 5 DAYS Performed at [facility], [address], [place], [place] [address]    Report Status PENDING  Incomplete      Radiology Studies: No results found.   Scheduled Meds:  dorzolamide   1 drop Left Eye BID   fluticasone  furoate-vilanterol  1 puff Inhalation Daily   gabapentin   100 mg Oral TID   heparin  injection (subcutaneous)  5,000 Units Subcutaneous Q8H   latanoprost   1 drop Left Eye QHS   melatonin  3 mg Oral QHS   methocarbamol   500 mg Oral QHS   metoprolol  succinate  25 mg Oral Daily   montelukast   10 mg Oral QHS   sodium chloride  flush  3 mL Intravenous Q12H   Continuous Infusions:  sodium chloride  75 mL/hr at 01/25/24 1345     LOS: 4 days    Leatrice LILLETTE Chapel, MD  01/26/2024, 3:44 PM

## 2024-01-26 NOTE — Plan of Care (Signed)
   Problem: Coping: Goal: Level of anxiety will decrease Outcome: Progressing   Problem: Pain Managment: Goal: General experience of comfort will improve and/or be controlled Outcome: Progressing

## 2024-01-27 DIAGNOSIS — S72002A Fracture of unspecified part of neck of left femur, initial encounter for closed fracture: Secondary | ICD-10-CM | POA: Diagnosis not present

## 2024-01-27 MED ORDER — ACETAMINOPHEN 650 MG RE SUPP: 650.0000 mg | Freq: Four times a day (QID) | RECTAL | 0 refills | Status: AC | PRN

## 2024-01-27 MED ORDER — ALBUTEROL SULFATE (2.5 MG/3ML) 0.083% IN NEBU: 2.5000 mg | INHALATION_SOLUTION | Freq: Four times a day (QID) | RESPIRATORY_TRACT | Status: AC | PRN

## 2024-01-27 MED ORDER — ACETAMINOPHEN 325 MG PO TABS: 650.0000 mg | ORAL_TABLET | Freq: Four times a day (QID) | ORAL | Status: AC | PRN

## 2024-01-27 MED ORDER — HYDROMORPHONE HCL 1 MG/ML IJ SOLN: 0.5000 mg | INTRAMUSCULAR | Status: AC | PRN

## 2024-01-27 MED ORDER — HEPARIN SODIUM (PORCINE) 5000 UNIT/ML IJ SOLN
5000.0000 [IU] | Freq: Three times a day (TID) | INTRAMUSCULAR | Status: AC
Start: 2024-01-27 — End: ?

## 2024-01-27 MED ORDER — METHOCARBAMOL 500 MG PO TABS: 500.0000 mg | ORAL_TABLET | Freq: Every day | ORAL | Status: AC

## 2024-01-27 MED ORDER — HYDROCODONE-ACETAMINOPHEN 5-325 MG PO TABS: 1.0000 | ORAL_TABLET | Freq: Four times a day (QID) | ORAL | Status: AC | PRN

## 2024-01-27 MED ORDER — MELATONIN 3 MG PO TABS: 3.0000 mg | ORAL_TABLET | Freq: Every day | ORAL | Status: AC

## 2024-01-27 NOTE — Discharge Summary (Signed)
 Physician Discharge Summary  Patient ID: Leslie Ball MRN: [id number] DOB/AGE: March 05, 1941 83 y.o.  Admit date: 01/22/2024 Discharge date: 01/27/2024  Admission Diagnoses:  Discharge Diagnoses:  Principal Problem:   Closed left hip fracture, initial encounter [facility]) Active Problems:   History of total left hip arthroplasty   History of bilateral knee replacement   Chronic osteoarthritis   Essential hypertension   History of gastroesophageal reflux (GERD)   Closed left hip fracture [facility])   Discharged Condition: stable  Hospital Course:  Patient is an 83 year old female with medical history significant of left femur fracture status post surgery in the past (unknown timeline), hypertension bilateral knee joint replacement with prosthesis, chronic nonspecific generalized osteoarthritis of hip joint, bilateral knee joint and bilateral shoulder.  Patient used to follow orthopedics and rheumatology and chronic pain management team.  Patient presented to emergency department with progressive worsening of left sided hip joint pain.  CT scan of the pelvis without contrast revealed comminuted fracture of the medial and posterior aspect of the acetabulum.  The orthopedic team has advised transferring patient to [facility] for further care.  Patient will be transferred under Dr. Jason Halvorson.    Left hip fracture: History of left hip arthroplasty: History of bilateral knee joint replacement: History of chronic osteoarthritis: History of chronic pain syndrome: -Patient presented to the emergency department with unbearable pain of the left hip joint and inability to get out of bed for 1 day. - At baseline, patient has chronic pain, however, the pain has progressively gotten worse. -Patient denied any fall or trauma. -X-ray of the hip revealed fracture of a screw along the superior aspect of the left  acetabular cup.  Slight rotation and protrusion of the acetabular cup through  the inner wall of the acetabulum.  -CT pelvis revealed left hip total arthroplasty with medial and superior displacement of the acetabular and comminuted fracture of the medial and posterior aspect of the acetabulum.Subacute/chronic appearing fracture of the left ischium. . Fracture of the most superior screw in the left acetabular cup. -X-ray bilateral knee revealed no evidence of fracture.  Uncomplicated bilateral knee prosthesis.   - Orthopedic team has advised transferring patient to [facility].   - Continue to optimize pain management. - Xarelto  is on hold.   Status post aspiration of the left hip to rule out infection 01/23/2024.  No growth so far.   Hypertension:  Continue metoprolol  succinate 25 mg daily.   Continue to hold HCTZ  IV hydralazine  as needed.     Glaucoma continue  eyedrops      Consults: orthopedic surgery  Significant Diagnostic Studies: CT pelvis without contrast revealed: 1. Left hip total arthroplasty with medial and superior displacement of the acetabular cup and comminuted fracture of the medial and posterior aspect of the acetabulum. 2. Subacute/chronic appearing fracture of the left ischium. 3. Fracture of the most superior screw in the left acetabular cup. 4. Enlarged fibroid uterus.   X-ray of the hip revealed: 1.  Fracture of a screw along the superior aspect of the left acetabular cup.  2. Slight rotation and protrusion of the acetabular cup through the inner wall of the acetabulum.   X-ray of the bilateral knee revealed: - Uncomplicated total knee joint prosthesis.  Treatments: Patient will be transferred to [facility] under Dr. Selinda Charter, orthopedic surgeon  Discharge Exam: Blood pressure (!) 147/54, pulse (!) 57, temperature 98.2 F (36.8 C), temperature source Oral, resp. rate 20,  height 5' 2 (1.575 m), weight 91.5 kg, SpO2 98%.   Disposition: Discharge disposition: 01-Home or Self  Care       Discharge Instructions     Diet - low sodium heart healthy   Complete by: As directed    Increase activity slowly   Complete by: As directed       Allergies as of 01/27/2024       Reactions   Oxycodone  Itching   Chlorhexidine  Itching   REACTION: redness        Medication List     STOP taking these medications    brinzolamide  1 % ophthalmic suspension Commonly known as: AZOPT    Ensure   hydrochlorothiazide  12.5 MG capsule Commonly known as: MICROZIDE    nabumetone 500 MG tablet Commonly known as: RELAFEN   potassium chloride  10 MEQ tablet Commonly known as: KLOR-CON    theophylline  300 MG 12 hr tablet Commonly known as: THEODUR   Travatan Z 0.004 % Soln ophthalmic solution Generic drug: Travoprost (BAK Free)       TAKE these medications    acetaminophen  325 MG tablet Commonly known as: TYLENOL  Take 2 tablets (650 mg total) by mouth every 6 (six) hours as needed for mild pain (pain score 1-3) or fever (or Fever >/= 101).   acetaminophen  650 MG suppository Commonly known as: TYLENOL  Place 1 suppository (650 mg total) rectally every 6 (six) hours as needed for mild pain (pain score 1-3) or fever (or Fever >/= 101).   albuterol  (2.5 MG/3ML) 0.083% nebulizer solution Commonly known as: PROVENTIL  Take 3 mLs (2.5 mg total) by nebulization every 6 (six) hours as needed for wheezing or shortness of breath.   dorzolamide  2 % ophthalmic solution Commonly known as: TRUSOPT  Place 1 drop into the left eye 2 (two) times daily.   Fluticasone -Salmeterol 250-50 MCG/DOSE Aepb Commonly known as: ADVAIR Inhale 1 puff into the lungs 2 (two) times daily as needed (for shortness of breath).   gabapentin  100 MG capsule Commonly known as: NEURONTIN  Take 100 mg by mouth 2 (two) times daily.   heparin  5000 UNIT/ML injection Inject 1 mL (5,000 Units total) into the skin every 8 (eight) hours.   HYDROcodone -acetaminophen  5-325 MG tablet Commonly known as:  NORCO/VICODIN Take 1 tablet by mouth every 6 (six) hours as needed for moderate pain (pain score 4-6).   HYDROmorphone  1 MG/ML injection Commonly known as: DILAUDID  Inject 0.5-1 mLs (0.5-1 mg total) into the vein every 3 (three) hours as needed for severe pain (pain score 7-10).   hydrOXYzine  25 MG tablet Commonly known as: ATARAX  Take 25 mg by mouth every 6 (six) hours as needed for itching.   latanoprost  0.005 % ophthalmic solution Commonly known as: XALATAN  Place 1 drop into the left eye at bedtime.   melatonin 3 MG Tabs tablet Take 1 tablet (3 mg total) by mouth at bedtime.   methocarbamol  500 MG tablet Commonly known as: ROBAXIN  Take 1 tablet (500 mg total) by mouth at bedtime. What changed:  when to take this reasons to take this   metoprolol  succinate 25 MG 24 hr tablet Commonly known as: TOPROL -XL Take 25 mg by mouth daily.   montelukast  10 MG tablet Commonly known as: SINGULAIR  Take 10 mg by mouth at bedtime.       Time spent: 35 minutes.  SignedBETHA Leatrice LILLETTE Rosario 01/27/2024, 1:46 PM

## 2024-01-27 NOTE — Progress Notes (Signed)
 Report called to [facility] at [facility]. [facility] to transport patient.  Donzell Barge, RN

## 2024-01-28 LAB — AEROBIC/ANAEROBIC CULTURE W GRAM STAIN (SURGICAL/DEEP WOUND): Culture: NO GROWTH

## 2024-06-11 ENCOUNTER — Ambulatory Visit

## 2024-06-18 ENCOUNTER — Ambulatory Visit

## 2024-06-20 ENCOUNTER — Ambulatory Visit: Admitting: Physical Therapy
